# Patient Record
Sex: Male | Born: 1955 | Hispanic: No | Marital: Married | Smoking: Current some day smoker
Health system: Southern US, Community
[De-identification: ages and names within clinical notes are randomized; demographics above are authoritative.]

## PROBLEM LIST (undated history)

## (undated) DIAGNOSIS — F329 Major depressive disorder, single episode, unspecified: Secondary | ICD-10-CM

## (undated) DIAGNOSIS — C61 Malignant neoplasm of prostate: Secondary | ICD-10-CM

## (undated) DIAGNOSIS — G459 Transient cerebral ischemic attack, unspecified: Secondary | ICD-10-CM

## (undated) DIAGNOSIS — M1990 Unspecified osteoarthritis, unspecified site: Secondary | ICD-10-CM

## (undated) DIAGNOSIS — N390 Urinary tract infection, site not specified: Secondary | ICD-10-CM

## (undated) DIAGNOSIS — E785 Hyperlipidemia, unspecified: Secondary | ICD-10-CM

## (undated) DIAGNOSIS — I639 Cerebral infarction, unspecified: Secondary | ICD-10-CM

## (undated) DIAGNOSIS — F32A Depression, unspecified: Secondary | ICD-10-CM

## (undated) DIAGNOSIS — F524 Premature ejaculation: Secondary | ICD-10-CM

## (undated) DIAGNOSIS — N400 Enlarged prostate without lower urinary tract symptoms: Secondary | ICD-10-CM

## (undated) DIAGNOSIS — E291 Testicular hypofunction: Secondary | ICD-10-CM

## (undated) DIAGNOSIS — N529 Male erectile dysfunction, unspecified: Secondary | ICD-10-CM

## (undated) HISTORY — DX: Transient cerebral ischemic attack, unspecified: G45.9

## (undated) HISTORY — DX: Hyperlipidemia, unspecified: E78.5

## (undated) HISTORY — DX: Cerebral infarction, unspecified: I63.9

## (undated) HISTORY — DX: Malignant neoplasm of prostate: C61

## (undated) HISTORY — DX: Premature ejaculation: F52.4

## (undated) HISTORY — DX: Major depressive disorder, single episode, unspecified: F32.9

## (undated) HISTORY — DX: Unspecified osteoarthritis, unspecified site: M19.90

## (undated) HISTORY — DX: Urinary tract infection, site not specified: N39.0

## (undated) HISTORY — DX: Male erectile dysfunction, unspecified: N52.9

## (undated) HISTORY — DX: Depression, unspecified: F32.A

## (undated) HISTORY — DX: Benign prostatic hyperplasia without lower urinary tract symptoms: N40.0

## (undated) HISTORY — PX: TONSILLECTOMY: SUR1361

## (undated) HISTORY — DX: Testicular hypofunction: E29.1

---

## 2006-01-26 ENCOUNTER — Other Ambulatory Visit: Payer: Self-pay

## 2006-01-26 ENCOUNTER — Observation Stay: Payer: Self-pay | Admitting: Internal Medicine

## 2008-05-01 ENCOUNTER — Emergency Department: Payer: Self-pay | Admitting: Emergency Medicine

## 2008-05-01 ENCOUNTER — Other Ambulatory Visit: Payer: Self-pay

## 2008-05-09 ENCOUNTER — Ambulatory Visit: Payer: Self-pay | Admitting: Neurology

## 2014-05-06 ENCOUNTER — Ambulatory Visit
Admission: RE | Admit: 2014-05-06 | Discharge: 2014-05-06 | Disposition: A | Discharge status: Home / Self Care | Payer: BC Managed Care – PPO | Source: Ambulatory Visit | Attending: Pain Medicine | Admitting: Pain Medicine

## 2014-05-06 ENCOUNTER — Other Ambulatory Visit: Payer: Self-pay | Admitting: Pain Medicine

## 2014-05-06 DIAGNOSIS — M25552 Pain in left hip: Secondary | ICD-10-CM

## 2014-05-06 DIAGNOSIS — M25551 Pain in right hip: Secondary | ICD-10-CM

## 2014-05-30 ENCOUNTER — Ambulatory Visit: Payer: BC Managed Care – PPO | Admitting: Neurology

## 2014-06-18 DIAGNOSIS — J449 Chronic obstructive pulmonary disease, unspecified: Secondary | ICD-10-CM | POA: Insufficient documentation

## 2014-07-10 ENCOUNTER — Telehealth: Payer: Self-pay | Admitting: *Deleted

## 2014-07-10 NOTE — Telephone Encounter (Signed)
Patient calling in to r/s his appointment from 05/30/14, patient was scheduled for 07/29/14 at 1:30 with Dr Krista Blue.

## 2014-07-10 NOTE — Telephone Encounter (Signed)
Patient called back and cancelled appointment with Dr Krista Blue on 07/29/14, patient states that he already has 5 doctors and that he can't afford another doctor.

## 2014-07-29 ENCOUNTER — Ambulatory Visit: Payer: Self-pay | Admitting: Neurology

## 2014-10-23 DIAGNOSIS — E7800 Pure hypercholesterolemia, unspecified: Secondary | ICD-10-CM | POA: Insufficient documentation

## 2014-10-23 DIAGNOSIS — Z720 Tobacco use: Secondary | ICD-10-CM | POA: Insufficient documentation

## 2014-10-23 DIAGNOSIS — F17200 Nicotine dependence, unspecified, uncomplicated: Secondary | ICD-10-CM | POA: Insufficient documentation

## 2014-10-23 DIAGNOSIS — F32A Depression, unspecified: Secondary | ICD-10-CM

## 2014-10-23 DIAGNOSIS — F329 Major depressive disorder, single episode, unspecified: Secondary | ICD-10-CM | POA: Insufficient documentation

## 2014-10-23 HISTORY — DX: Depression, unspecified: F32.A

## 2014-11-17 DIAGNOSIS — G8929 Other chronic pain: Secondary | ICD-10-CM | POA: Insufficient documentation

## 2014-11-17 DIAGNOSIS — M549 Dorsalgia, unspecified: Secondary | ICD-10-CM

## 2014-12-16 DIAGNOSIS — R262 Difficulty in walking, not elsewhere classified: Secondary | ICD-10-CM | POA: Insufficient documentation

## 2015-01-03 ENCOUNTER — Ambulatory Visit
Admit: 2015-01-03 | Disposition: A | Discharge status: Home / Self Care | Payer: Self-pay | Attending: Neurology | Admitting: Neurology

## 2015-02-27 ENCOUNTER — Ambulatory Visit: Payer: Self-pay | Admitting: Urology

## 2015-03-05 ENCOUNTER — Telehealth: Payer: Self-pay | Admitting: Urology

## 2015-03-05 NOTE — Telephone Encounter (Signed)
[name] to call, to notify him of his prostate biopsy results. Per Dr. Erlene Quan results are negative except one core which is only atypical we will need to schedule him for a follow up apt for discussion on ED

## 2015-03-09 NOTE — Telephone Encounter (Signed)
Spoke with patient and notified him of the biopsy results and he states that he had been previously scheduled for an apt on Friday with Dr. Erlene Quan for further discussion on ED.

## 2015-03-10 ENCOUNTER — Encounter: Payer: Self-pay | Admitting: Family Medicine

## 2015-03-13 ENCOUNTER — Encounter: Payer: Self-pay | Admitting: Urology

## 2015-03-13 ENCOUNTER — Telehealth: Payer: Self-pay | Admitting: *Deleted

## 2015-03-13 ENCOUNTER — Ambulatory Visit (INDEPENDENT_AMBULATORY_CARE_PROVIDER_SITE_OTHER): Payer: BLUE CROSS/BLUE SHIELD | Admitting: Urology

## 2015-03-13 VITALS — BP 117/80 | HR 109 | Ht 67.0 in | Wt 140.6 lb

## 2015-03-13 DIAGNOSIS — N528 Other male erectile dysfunction: Secondary | ICD-10-CM | POA: Diagnosis not present

## 2015-03-13 DIAGNOSIS — C61 Malignant neoplasm of prostate: Secondary | ICD-10-CM

## 2015-03-13 MED ORDER — ALPROSTADIL (VASODILATOR) 20 MCG IC KIT: 20.0000 ug | PACK | INTRACAVERNOUS | Status: DC | PRN

## 2015-03-13 NOTE — Telephone Encounter (Signed)
Caverject called into pharmacy. Ok per Dr. Erlene Quan. Cw,lpn

## 2015-03-13 NOTE — Patient Instructions (Signed)
Erectile Dysfunction Erectile dysfunction is the inability to get or sustain a good enough erection to have sexual intercourse. Erectile dysfunction may involve:  Inability to get an erection.  Lack of enough hardness to allow penetration.  Loss of the erection before sex is finished.  Premature ejaculation. CAUSES  Certain drugs, such as:  Pain relievers.  Antihistamines.  Antidepressants.  Blood pressure medicines.  Water pills (diuretics).  Ulcer medicines.  Muscle relaxants.  Illegal drugs.  Excessive drinking.  Psychological causes, such as:  Anxiety.  Depression.  Sadness.  Exhaustion.  Performance fear.  Stress.  Physical causes, such as:  Artery problems. This may include diabetes, smoking, liver disease, or atherosclerosis.  High blood pressure.  Hormonal problems, such as low testosterone.  Obesity.  Nerve problems. This may include back or pelvic injuries, diabetes mellitus, multiple sclerosis, or Parkinson disease. SYMPTOMS  Inability to get an erection.  Lack of enough hardness to allow penetration.  Loss of the erection before sex is finished.  Premature ejaculation.  Normal erections at some times, but with frequent unsatisfactory episodes.  Orgasms that are not satisfactory in sensation or frequency.  Low sexual satisfaction in either partner because of erection problems.  A curved penis occurring with erection. The curve may cause pain or may be too curved to allow for intercourse.  Never having nighttime erections. DIAGNOSIS Your caregiver can often diagnose this condition by:  Performing a physical exam to find other diseases or specific problems with the penis.  Asking you detailed questions about the problem.  Performing blood tests to check for diabetes mellitus or to measure hormone levels.  Performing urine tests to find other underlying health conditions.  Performing an ultrasound exam to check for  scarring.  Performing a test to check blood flow to the penis.  Doing a sleep study at home to measure nighttime erections. TREATMENT   You may be prescribed medicines by mouth.  You may be given medicine injections into the penis.  You may be prescribed a vacuum pump with a ring.  Penile implant surgery may be performed. You may receive:  An inflatable implant.  A semirigid implant.  Blood vessel surgery may be performed. HOME CARE INSTRUCTIONS  If you are prescribed oral medicine, you should take the medicine as prescribed. Do not increase the dosage without first discussing it with your physician.  If you are using self-injections, be careful to avoid any veins that are on the surface of the penis. Apply pressure to the injection site for 5 minutes.  If you are using a vacuum pump, make sure you have read the instructions before using it. Discuss any questions with your physician before taking the pump home. SEEK MEDICAL CARE IF:  You experience pain that is not responsive to the pain medicine you have been prescribed.  You experience nausea or vomiting. SEEK IMMEDIATE MEDICAL CARE IF:   When taking oral or injectable medications, you experience an erection that lasts longer than 4 hours. If your physician is unavailable, go to the nearest emergency room for evaluation. An erection that lasts much longer than 4 hours can result in permanent damage to your penis.  You have pain that is severe.  You develop redness, severe pain, or severe swelling of your penis.  You have redness spreading up into your groin or lower abdomen.  You are unable to pass your urine. Document Released: 09/09/2000 Document Revised: 05/15/2013 Document Reviewed: 02/14/2013 [facility] Patient Information 2015 [place], [place]. This information is not  intended to replace advice given to you by your health care provider. Make sure you discuss any questions you have with your health care provider.

## 2015-03-13 NOTE — Telephone Encounter (Signed)
Pt called and states that Dr. Erlene Quan prescribed him an injection today. His insurance will not cover that injection. His insurance will cover Cavergett , Alerostavil,  He prefer the Cavergett injection. Can you send it to his pharmacy. Pharmacy [facility] [place]

## 2015-03-13 NOTE — Progress Notes (Signed)
03/13/2015 1:54 PM   Mark Hodges 1955-11-03 [id number]  Referring provider: No referring provider defined for this encounter.  Chief Complaint  Patient presents with  . Follow-up    biopsy results  . Erectile Dysfunction    pt want to discuss ED injections    HPI:  59 yo M with diagnosed in 01/2014 with T1c Gleason 3+3 prostate cancer in 2/12 cores, 1-3% and single core HGPIN dx 01/2014. PSA at time of dx 5.41. TRUS vol 34 cc. He has been on active surveillance.   Repeat biopsy on 02/20/15 per active surveillance protocol which showed only a small focus of atypical glands, suspicious for carcinoma   TRUS vol 33 cc.     His most recent PSA was 7.0 in May 6th 2016.  Also complains of LUTS, urinary frequency. Not taking any prostate meds. He was previously prescribed Flomax back in 9/ 2015 but never took this medication.  Baseline ED, failed multiple PDE5i, unable to afford injections but very interested in persuing. No sucess with [facility].    PMH: Past Medical History  Diagnosis Date  . Prostate cancer   . BPH (benign prostatic hyperplasia)   . Erectile dysfunction   . Premature ejaculation   . Hypogonadism in male   . Hyperlipidemia   . Lower urinary tract infection   . TIA (transient ischemic attack)     Surgical History: Past Surgical History  Procedure Laterality Date  . Tonsillectomy      Home Medications:    Medication List       This list is accurate as of: 03/13/15 11:59 PM.  Always use your most recent med list.               alprostadil 20 MCG injection  Commonly known as:  EDEX  20 mcg by Intracavitary route as needed for erectile dysfunction. use no more than 3 times per week     ANORO ELLIPTA 62.5-25 MCG/INH Aepb  Generic drug:  Umeclidinium-Vilanterol  Inhale into the lungs.     aspirin 325 MG tablet  Take by mouth.     atorvastatin 10 MG tablet  Commonly known as:  LIPITOR  Take by mouth.     clonazePAM 0.5 MG tablet    Commonly known as:  KLONOPIN  Take by mouth.     gabapentin 300 MG capsule  Commonly known as:  NEURONTIN     ipratropium-albuterol 0.5-2.5 (3) MG/3ML Soln  Commonly known as:  DUONEB  Inhale into the lungs.     nortriptyline 10 MG capsule  Commonly known as:  PAMELOR  Take 3 pills at night for a week then increase to 4 pills at night and continue that dose     nortriptyline 50 MG capsule  Commonly known as:  PAMELOR  Take by mouth.     oxyCODONE-acetaminophen 10-325 MG per tablet  Commonly known as:  PERCOCET  Take by mouth.     PROAIR HFA 108 (90 BASE) MCG/ACT inhaler  Generic drug:  albuterol  Inhale into the lungs.     Venlafaxine HCl 225 MG Tb24        Allergies: No Known Allergies  Family History: Family History  Problem Relation Age of Onset  . Heart disease Father     Social History:  reports that he has been smoking.  His smokeless tobacco use includes Chew. He reports that he does not drink alcohol or use illicit drugs.   Physical Exam: BP 117/80 mmHg  Pulse 109  Ht 5\' 7"  (1.702 m)  Wt 140 lb 9.6 oz (63.776 kg)  BMI 22.02 kg/m2  Constitutional:  Alert and oriented, No acute distress. Ambulating with cane. HEENT: [place] AT, moist mucus membranes.  Trachea midline, no masses. Cardiovascular: No clubbing, cyanosis, or edema. Respiratory: Normal respiratory effort, no increased work of breathing. GI: Abdomen is soft, nontender, nondistended, no abdominal masses GU: No CVA tenderness.  Skin: No rashes, bruises or suspicious lesions. Neurologic: Grossly intact, no focal deficits, moving all 4 extremities. Psychiatric: Normal mood and affect.  Assessment & Plan:   1. Prostate cancer Repeat PNBx reviewed, no evidence of clinically signficant cancer despite rising PSA to 7.0.  Results reviewed with patient today.  Recommend continuation of active surveillance.    2. Other male erectile dysfunction Discussed intercavernosal injections today.  Prescribed  Trimix and will have patient return for teaching to our office in 2-3 weeks.   Return in about 3 weeks (around 04/03/2015) for injection teaching.  Hollice Espy, MD  [facility] [address], [place], [place] [address] [phone]

## 2015-03-15 ENCOUNTER — Encounter: Payer: Self-pay | Admitting: Urology

## 2015-03-16 ENCOUNTER — Other Ambulatory Visit: Payer: Self-pay | Admitting: Urology

## 2015-03-17 ENCOUNTER — Other Ambulatory Visit: Payer: Self-pay

## 2015-03-17 DIAGNOSIS — N529 Male erectile dysfunction, unspecified: Secondary | ICD-10-CM

## 2015-03-17 MED ORDER — ALPROSTADIL (VASODILATOR) 20 MCG IC KIT: 20.0000 ug | PACK | INTRACAVERNOUS | Status: DC | PRN

## 2015-03-19 ENCOUNTER — Telehealth: Payer: Self-pay

## 2015-03-19 NOTE — Telephone Encounter (Signed)
Pt called asking if you would be willing to call in [facility] and then a months supply from here on out to [place]. Please advise.

## 2015-03-22 NOTE — Telephone Encounter (Signed)
OK.  We will hold off on the express scripts prescription until we have done our office teaching.

## 2015-03-23 ENCOUNTER — Other Ambulatory Visit: Payer: Self-pay

## 2015-03-23 DIAGNOSIS — N529 Male erectile dysfunction, unspecified: Secondary | ICD-10-CM

## 2015-03-23 MED ORDER — CAVERJECT IMPULSE 20 MCG IC KIT: 20.0000 ug | PACK | INTRACAVERNOUS | Status: DC | PRN

## 2015-03-23 NOTE — Telephone Encounter (Signed)
Spoke with pt wife, made aware of caverject ordering. Wife voiced understanding. Pt will call back to make f/u appt with Dr. Erlene Quan. Cw,lpn

## 2015-04-10 ENCOUNTER — Ambulatory Visit (INDEPENDENT_AMBULATORY_CARE_PROVIDER_SITE_OTHER): Payer: BLUE CROSS/BLUE SHIELD | Admitting: Urology

## 2015-04-10 VITALS — BP 128/74 | HR 105 | Ht 68.0 in | Wt 136.8 lb

## 2015-04-10 DIAGNOSIS — C61 Malignant neoplasm of prostate: Secondary | ICD-10-CM

## 2015-04-10 DIAGNOSIS — N528 Other male erectile dysfunction: Secondary | ICD-10-CM | POA: Diagnosis not present

## 2015-04-10 NOTE — Progress Notes (Signed)
3:37 PM   DANA DORNER 1956-05-27 [id number]  Referring provider: Tracie Harrier, MD [place], [place] [address]  Chief Complaint  Patient presents with  . Erectile Dysfunction    Injection teaching    HPI:  59 yo M with diagnosed in 01/2014 with T1c Gleason 3+3 prostate cancer in 2/12 cores, 1-3% and single core HGPIN dx 01/2014. PSA at time of dx 5.41. TRUS vol 34 cc. He has been on active surveillance.   Repeat biopsy on 02/20/15 per active surveillance protocol which showed only a small focus of atypical glands, suspicious for carcinoma   TRUS vol 33 cc.   His most recent PSA was 7.0 in May 6th 2016/ rectal exam normal.  Also complains of LUTS, urinary frequency. Not taking any prostate meds. He was previously prescribed Flomax back in 9/ 2015 but never took this medication.  Baseline ED, failed multiple PDE5i, unable to afford injections but very interested in persuing. No sucess with VED.    He presents today for Caverject teaching.      PMH: Past Medical History  Diagnosis Date  . Prostate cancer   . BPH (benign prostatic hyperplasia)   . Erectile dysfunction   . Premature ejaculation   . Hypogonadism in male   . Hyperlipidemia   . Lower urinary tract infection   . TIA (transient ischemic attack)     Surgical History: Past Surgical History  Procedure Laterality Date  . Tonsillectomy      Home Medications:    Medication List       This list is accurate as of: 04/10/15  3:37 PM.  Always use your most recent med list.               alprostadil 20 MCG injection  Commonly known as:  EDEX  20 mcg by Intracavitary route as needed for erectile dysfunction. use no more than 3 times per week     CAVERJECT IMPULSE 20 MCG injection  Generic drug:  alprostadil  20 mcg by Intracavitary route as needed for erectile dysfunction. use no more than 3 times per week     ANORO ELLIPTA 62.5-25 MCG/INH Aepb  Generic drug:   Umeclidinium-Vilanterol  Inhale into the lungs.     aspirin 325 MG tablet  Take by mouth.     atorvastatin 10 MG tablet  Commonly known as:  LIPITOR  Take by mouth.     clonazePAM 0.5 MG tablet  Commonly known as:  KLONOPIN  Take by mouth.     gabapentin 300 MG capsule  Commonly known as:  NEURONTIN     ipratropium-albuterol 0.5-2.5 (3) MG/3ML Soln  Commonly known as:  DUONEB  Inhale into the lungs.     nortriptyline 10 MG capsule  Commonly known as:  PAMELOR  Take 3 pills at night for a week then increase to 4 pills at night and continue that dose     nortriptyline 50 MG capsule  Commonly known as:  PAMELOR  Take by mouth.     oxyCODONE-acetaminophen 10-325 MG per tablet  Commonly known as:  PERCOCET  Take by mouth.     PROAIR HFA 108 (90 BASE) MCG/ACT inhaler  Generic drug:  albuterol  Inhale into the lungs.     Venlafaxine HCl 225 MG Tb24        Allergies: No Known Allergies  Family History: Family History  Problem Relation Age of Onset  . Heart disease Father  Social History:  reports that he has been smoking.  His smokeless tobacco use includes Chew. He reports that he does not drink alcohol or use illicit drugs.   Physical Exam: BP 128/74 mmHg  Pulse 105  Ht 5\' 8"  (1.727 m)  Wt 136 lb 12.8 oz (62.052 kg)  BMI 20.81 kg/m2  Constitutional:  Alert and oriented, No acute distress. Ambulating with cane. HEENT: [place] AT, moist mucus membranes.  Trachea midline, no masses. Cardiovascular: No clubbing, cyanosis, or edema. Respiratory: Normal respiratory effort, no increased work of breathing. GI: Abdomen is soft, nontender, nondistended, no abdominal masses GU: No CVA tenderness.   Normal circumcised phallus. Skin: No rashes, bruises or suspicious lesions. Neurologic: Grossly intact, no focal deficits, moving all 4 extremities. Psychiatric: Normal mood and affect.  Procedure:  Lateral aspect of right corpora and midshaft cleaned with alcohol pad.  Penis placed on stretch. 5 g injection given without complication. Pressure held at injection sites for a few minutes. Performing injection, patient was taught how to prep and administered injection.  With 5 minutes, the patient achieved an approximately 80% Foley tumesced erection lasting approximately 15 minutes with spontaneous detumescence.  Assessment & Plan:   1. Prostate cancer  Diagnosed in 01/2014 with T1c Gleason 3+3 prostate cancer in 2/12 cores, 1-3% and single core HGPIN dx 01/2014. PSA at time of dx 5.41. Repeat bx 01/2015,  Since ocus of atypical cells but no evidence of clinically signficant cancer despite rising PSA to 7.0.   Recommend continuation of active surveillance.    2. Other male erectile dysfunction Status post successful non-painful erection with 5 g of Caverject in the shortly sustained erection. Recommend home dose of 10 g. Teaching sufficient today.  Patient advised to call if dose needs to be titrated due to poor quality or duration..  Return in about 4 months (around 08/11/2015) for PSA/ DRE.  Hollice Espy, MD  [facility] [address] [place],  [address] [phone]   I spent 20 min with this patient of which greater than 50% was spent in counseling and coordination of care with the patient.

## 2015-04-12 ENCOUNTER — Encounter: Payer: Self-pay | Admitting: Urology

## 2015-04-23 DIAGNOSIS — E785 Hyperlipidemia, unspecified: Secondary | ICD-10-CM | POA: Insufficient documentation

## 2015-05-29 DIAGNOSIS — R200 Anesthesia of skin: Secondary | ICD-10-CM | POA: Insufficient documentation

## 2015-06-03 ENCOUNTER — Encounter: Payer: Self-pay | Admitting: Emergency Medicine

## 2015-06-03 ENCOUNTER — Emergency Department
Admission: EM | Admit: 2015-06-03 | Discharge: 2015-06-03 | Disposition: A | Discharge status: Home / Self Care | Payer: BLUE CROSS/BLUE SHIELD | Attending: Emergency Medicine | Admitting: Emergency Medicine

## 2015-06-03 ENCOUNTER — Emergency Department: Payer: BLUE CROSS/BLUE SHIELD

## 2015-06-03 DIAGNOSIS — Y92007 Garden or yard of unspecified non-institutional (private) residence as the place of occurrence of the external cause: Secondary | ICD-10-CM | POA: Insufficient documentation

## 2015-06-03 DIAGNOSIS — M25552 Pain in left hip: Secondary | ICD-10-CM

## 2015-06-03 DIAGNOSIS — Z79899 Other long term (current) drug therapy: Secondary | ICD-10-CM | POA: Diagnosis not present

## 2015-06-03 DIAGNOSIS — Z7982 Long term (current) use of aspirin: Secondary | ICD-10-CM | POA: Diagnosis not present

## 2015-06-03 DIAGNOSIS — Z79891 Long term (current) use of opiate analgesic: Secondary | ICD-10-CM | POA: Insufficient documentation

## 2015-06-03 DIAGNOSIS — Y990 Civilian activity done for income or pay: Secondary | ICD-10-CM | POA: Diagnosis not present

## 2015-06-03 DIAGNOSIS — Z79811 Long term (current) use of aromatase inhibitors: Secondary | ICD-10-CM | POA: Insufficient documentation

## 2015-06-03 DIAGNOSIS — Z7951 Long term (current) use of inhaled steroids: Secondary | ICD-10-CM | POA: Insufficient documentation

## 2015-06-03 DIAGNOSIS — S79912A Unspecified injury of left hip, initial encounter: Secondary | ICD-10-CM | POA: Insufficient documentation

## 2015-06-03 DIAGNOSIS — M545 Low back pain, unspecified: Secondary | ICD-10-CM

## 2015-06-03 DIAGNOSIS — Z720 Tobacco use: Secondary | ICD-10-CM | POA: Diagnosis not present

## 2015-06-03 DIAGNOSIS — S3992XA Unspecified injury of lower back, initial encounter: Secondary | ICD-10-CM | POA: Insufficient documentation

## 2015-06-03 DIAGNOSIS — X58XXXA Exposure to other specified factors, initial encounter: Secondary | ICD-10-CM | POA: Diagnosis not present

## 2015-06-03 DIAGNOSIS — Y9389 Activity, other specified: Secondary | ICD-10-CM | POA: Insufficient documentation

## 2015-06-03 MED ORDER — DIAZEPAM 5 MG PO TABS
5.0000 mg | ORAL_TABLET | Freq: Once | ORAL | Status: AC
  Administered 2015-06-03: 5 mg via ORAL
  Filled 2015-06-03: qty 1

## 2015-06-03 MED ORDER — OXYCODONE HCL 5 MG PO TABS
5.0000 mg | ORAL_TABLET | Freq: Once | ORAL | Status: AC
  Administered 2015-06-03: 5 mg via ORAL
  Filled 2015-06-03: qty 1

## 2015-06-03 MED ORDER — DIAZEPAM 5 MG PO TABS: 5.0000 mg | ORAL_TABLET | Freq: Two times a day (BID) | ORAL | Status: DC | PRN

## 2015-06-03 NOTE — ED Notes (Signed)
Feels a little better. But wishes to speak with Dr.Kaminski before discharge

## 2015-06-03 NOTE — ED Provider Notes (Signed)
[facility] Emergency Department Provider Note  ____________________________________________  Time seen: 1246   I have reviewed the triage vital signs and the nursing notes.   HISTORY  Chief Complaint Back Pain     HPI Mark Hodges is a 59 y.o. male who presents to the emergency department by EMS due to severe back pain.  He reports this past weekend he was working in the yard. This included using a chainsaw to cut down some bushes and to hold debris away. At one point, he attempted to drag a cedar over towards his riding mower so he'll be able to drag it away. When he did this he felt a pull (not a pop as noted in the triage note). He had mild soreness after this event on Saturday and Sunday which continued through the early part of this week, but the discomfort was not severe. Today, while sitting on the side of his bed to put his shoes on, he felt a sudden spasm occur with pain radiating into the left hip. He reports the pain was 20/10 and brought him to tears. He did take an oxycodone, 10 mg, which she said provided no relief, however he is able to calmly speak with [place] here.  He denies any weakness in the left leg or loss of sensation.    Past Medical History  Diagnosis Date  . Prostate cancer   . BPH (benign prostatic hyperplasia)   . Erectile dysfunction   . Premature ejaculation   . Hypogonadism in male   . Hyperlipidemia   . Lower urinary tract infection   . TIA (transient ischemic attack)     Patient Active Problem List   Diagnosis Date Noted  . Has a tremor 12/24/2014  . Difficulty in walking 12/16/2014  . Back pain, chronic 11/17/2014  . Cephalalgia 11/17/2014  . Tingling 11/17/2014  . Clinical depression 10/23/2014  . Compulsive tobacco user syndrome 10/23/2014  . Hypercholesterolemia without hypertriglyceridemia 10/23/2014  . 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 Tonsillectomy       Current Outpatient Rx  Name  Route  Sig  Dispense  Refill  . albuterol (PROAIR HFA) 108 (90 BASE) MCG/ACT inhaler   Inhalation   Inhale into the lungs.         Marland Kitchen alprostadil (EDEX) 20 MCG injection   Intracavitary   20 mcg by Intracavitary route as needed for erectile dysfunction. use no more than 3 times per week   1 each   12   . aspirin 325 MG tablet   Oral   Take by mouth.         Marland Kitchen atorvastatin (LIPITOR) 10 MG tablet   Oral   Take by mouth.         Marland Kitchen CAVERJECT IMPULSE 20 MCG injection   Intracavitary   20 mcg by Intracavitary route as needed for erectile dysfunction. use no more than 3 times per week   6 each   0     Dispense as written.   . clonazePAM (KLONOPIN) 0.5 MG tablet   Oral   Take by mouth.         . diazepam (VALIUM) 5 MG tablet   Oral   Take 1 tablet (5 mg total) by mouth every 12 (twelve) hours as needed for muscle spasms.   16 tablet   0   . gabapentin (NEURONTIN) 300 MG capsule               .  ipratropium-albuterol (DUONEB) 0.5-2.5 (3) MG/3ML SOLN   Inhalation   Inhale into the lungs.         . nortriptyline (PAMELOR) 10 MG capsule      Take 3 pills at night for a week then increase to 4 pills at night and continue that dose         . nortriptyline (PAMELOR) 50 MG capsule   Oral   Take by mouth.         . oxyCODONE-acetaminophen (PERCOCET) 10-325 MG per tablet   Oral   Take by mouth.         . Umeclidinium-Vilanterol (ANORO ELLIPTA) 62.5-25 MCG/INH AEPB   Inhalation   Inhale into the lungs.         . Venlafaxine HCl 225 MG TB24                 Allergies Review of patient's allergies indicates no known allergies.  Family History  Problem Relation Age of Onset  . Heart disease Father     Social History Social History  Substance Use Topics  . Smoking status: Current Some Day Smoker  . Smokeless tobacco: Current User    Types: Chew  . Alcohol Use: No    Review of Systems  Constitutional:  Negative for fever. ENT: Negative for sore throat. Cardiovascular: Negative for chest pain. Respiratory: Negative for shortness of breath. Gastrointestinal: Negative for abdominal pain, vomiting and diarrhea. Genitourinary: Negative for dysuria. Musculoskeletal: Pain in left gluteal and left hip and back. See history of present illness Skin: Negative for rash. Neurological: Negative for headaches   10-point ROS otherwise negative.  ____________________________________________   PHYSICAL EXAM:  VITAL SIGNS: ED Triage Vitals  Enc Vitals Group     BP 06/03/15 1228 144/85 mmHg     Pulse Rate 06/03/15 1228 90     Resp 06/03/15 1228 20     Temp 06/03/15 1228 99 F (37.2 C)     Temp Source 06/03/15 1228 Oral     SpO2 06/03/15 1228 99 %     Weight 06/03/15 1228 148 lb (67.132 kg)     Height 06/03/15 1228 5\' 8"  (1.727 m)     Head Cir --      Peak Flow --      Pain Score 06/03/15 1229 10     Pain Loc --      Pain Edu? --      Excl. in [place]? --     Constitutional:  Alert and oriented. Well appearing and in no distress, but uncomfortable at times with movement and draining parts of the physical exam. ENT   Head: Normocephalic and atraumatic.   Nose: No congestion/rhinnorhea. Cardiovascular: Normal rate, regular rhythm, no murmur noted Respiratory:  Normal respiratory effort, no tachypnea.    Breath sounds are clear and equal bilaterally.  Gastrointestinal: Soft and nontender. No distention.  Back: Pain with some movement, but no focal pain on palpation. He has no midline pain in his lumbar section and no significant pain on palpation of the paraspinals.  The patient does have notable pain with both passive and active elevation of the left leg. This pain is primarily in the left gluteus. Musculoskeletal: No deformity noted. As noted above, the patient has discomfort with movement of the left leg. The pelvis is stable and there is no pain on palpation of the hip or iliac  crests. Neurologic:  Normal speech and language. No gross focal neurologic deficits are appreciated. Patient is equal  and normal sensation in both distal legs. He has good plantar and dorsal flexion. Skin:  Skin is warm, dry. No rash noted. Psychiatric: Mood and affect are normal. Speech and behavior are normal.  ____________________________________________   RADIOLOGY  Lumbar spine   IMPRESSION: 1. No evidence of acute osseous abnormality. 2. Mild lumbar spondylosis.  Left hip  IMPRESSION: No fracture or dislocation. Joint spaces appear unremarkable.  ____________________________________________  INITIAL IMPRESSION / ASSESSMENT AND PLAN / ED COURSE  Pertinent labs & imaging results that were available during my care of the patient were reviewed by me and considered in my medical decision making (see chart for details).  Patient with acute onset of pain in his left hip and lower back. We will obtain an x-ray of his lumbar spine due to the small possibility of a compression fracture from his activity this past Saturday. We will also obtain an x-ray of the left hip, given his history of arthritis and the degree of pain that he is having in this area.  ----------------------------------------- 2:11 PM on 06/03/2015 -----------------------------------------  X-rays of this patient's back and left hip are normal. We will discharge him with a prescription for Valium for muscle relaxation. He has oxycodone at home which she can continue to use. I've counseled him to take ibuprofen as well as.  At this time, the patient appears comfortable. We have outlined the differential diagnosis including the possibility of a herniated disc. The patient understands that it will take time for this condition to improve and he will follow-up with his regular doctor.  ____________________________________________   FINAL CLINICAL IMPRESSION(S) / ED DIAGNOSES  Final diagnoses:  Left-sided low back  pain without sciatica  Left hip pain      Ahmed Prima, MD 06/03/15 1435

## 2015-06-03 NOTE — ED Notes (Signed)
Brought in via ems from home . States he was pulling weeds this weekend  Bent down to put on shoe and felt a pop pain is from let hip and lower back . Took oxycodone about 10 am w/o relief. Unable to stand or sit up

## 2015-06-03 NOTE — ED Notes (Signed)
Having severe pain to lower back and left hip area  . States he felt a pop this am. And has not been able to sit up or stand.

## 2015-06-03 NOTE — Discharge Instructions (Signed)
Take ibuprofen, 6 mg, 3 times a day, for 3 days, then as needed. Continue to use your regular pain medicine as needed. You may take Valium for additional muscle relaxation. Follow-up with your regular doctor. Return to the emergency department if you have worsening symptoms, radiation of discomfort down the left leg, difficulties urinating or defecating, or if you have other urgent concerns.  Back Pain, Adult Back pain is very common. The pain often gets better over time. The cause of back pain is usually not dangerous. Most people can learn to manage their back pain on their own.  HOME CARE   Stay active. Start with short walks on flat ground if you can. Try to walk farther each day.  Do not sit, drive, or stand in one place for more than 30 minutes. Do not stay in bed.  Do not avoid exercise or work. Activity can help your back heal faster.  Be careful when you bend or lift an object. Bend at your knees, keep the object close to you, and do not twist.  Sleep on a firm mattress. Lie on your side, and bend your knees. If you lie on your back, put a pillow under your knees.  Only take medicines as told by your doctor.  Put ice on the injured area.  Put ice in a plastic bag.  Place a towel between your skin and the bag.  Leave the ice on for 15-20 minutes, 03-04 times a day for the first 2 to 3 days. After that, you can switch between ice and heat packs.  Ask your doctor about back exercises or massage.  Avoid feeling anxious or stressed. Find good ways to deal with stress, such as exercise. GET HELP RIGHT AWAY IF:   Your pain does not go away with rest or medicine.  Your pain does not go away in 1 week.  You have new problems.  You do not feel well.  The pain spreads into your legs.  You cannot control when you poop (bowel movement) or pee (urinate).  Your arms or legs feel weak or lose feeling (numbness).  You feel sick to your stomach (nauseous) or throw up  (vomit).  You have belly (abdominal) pain.  You feel like you may pass out (faint). MAKE SURE YOU:   Understand these instructions.  Will watch your condition.  Will get help right away if you are not doing well or get worse. Document Released: 02/29/2008 Document Revised: 12/05/2011 Document Reviewed: 01/14/2014 [facility] Patient Information 2015 [place], [place]. This information is not intended to replace advice given to you by your health care provider. Make sure you discuss any questions you have with your health care provider.

## 2015-06-17 ENCOUNTER — Other Ambulatory Visit: Payer: Self-pay | Admitting: Internal Medicine

## 2015-06-17 DIAGNOSIS — S3992XA Unspecified injury of lower back, initial encounter: Secondary | ICD-10-CM

## 2015-06-17 DIAGNOSIS — C61 Malignant neoplasm of prostate: Secondary | ICD-10-CM

## 2015-06-17 DIAGNOSIS — M5442 Lumbago with sciatica, left side: Secondary | ICD-10-CM

## 2015-06-26 ENCOUNTER — Ambulatory Visit
Admission: RE | Admit: 2015-06-26 | Discharge: 2015-06-26 | Disposition: A | Discharge status: Home / Self Care | Payer: BLUE CROSS/BLUE SHIELD | Source: Ambulatory Visit | Attending: Internal Medicine | Admitting: Internal Medicine

## 2015-06-26 DIAGNOSIS — M5126 Other intervertebral disc displacement, lumbar region: Secondary | ICD-10-CM | POA: Insufficient documentation

## 2015-06-26 DIAGNOSIS — M5442 Lumbago with sciatica, left side: Secondary | ICD-10-CM

## 2015-06-26 DIAGNOSIS — S3992XA Unspecified injury of lower back, initial encounter: Secondary | ICD-10-CM

## 2015-06-26 DIAGNOSIS — C61 Malignant neoplasm of prostate: Secondary | ICD-10-CM

## 2015-06-26 DIAGNOSIS — M542 Cervicalgia: Secondary | ICD-10-CM | POA: Diagnosis present

## 2015-06-26 MED ORDER — GADOBENATE DIMEGLUMINE 529 MG/ML IV SOLN
15.0000 mL | Freq: Once | INTRAVENOUS | Status: AC | PRN
  Administered 2015-06-26: 13 mL via INTRAVENOUS

## 2015-07-14 ENCOUNTER — Other Ambulatory Visit: Payer: Self-pay | Admitting: Neurosurgery

## 2015-07-15 ENCOUNTER — Other Ambulatory Visit: Payer: Self-pay | Admitting: Neurosurgery

## 2015-07-15 DIAGNOSIS — M5410 Radiculopathy, site unspecified: Secondary | ICD-10-CM

## 2015-07-22 ENCOUNTER — Ambulatory Visit
Admission: RE | Admit: 2015-07-22 | Discharge: 2015-07-22 | Disposition: A | Discharge status: Home / Self Care | Payer: BLUE CROSS/BLUE SHIELD | Source: Ambulatory Visit | Attending: Neurosurgery | Admitting: Neurosurgery

## 2015-07-22 DIAGNOSIS — M4804 Spinal stenosis, thoracic region: Secondary | ICD-10-CM | POA: Insufficient documentation

## 2015-07-22 DIAGNOSIS — G950 Syringomyelia and syringobulbia: Secondary | ICD-10-CM | POA: Diagnosis not present

## 2015-07-22 DIAGNOSIS — M47894 Other spondylosis, thoracic region: Secondary | ICD-10-CM | POA: Diagnosis not present

## 2015-07-22 DIAGNOSIS — M5410 Radiculopathy, site unspecified: Secondary | ICD-10-CM

## 2015-07-22 DIAGNOSIS — M5414 Radiculopathy, thoracic region: Secondary | ICD-10-CM | POA: Diagnosis present

## 2015-07-22 DIAGNOSIS — M5416 Radiculopathy, lumbar region: Secondary | ICD-10-CM | POA: Diagnosis present

## 2015-07-22 MED ORDER — GADOBENATE DIMEGLUMINE 529 MG/ML IV SOLN
15.0000 mL | Freq: Once | INTRAVENOUS | Status: AC | PRN
  Administered 2015-07-22: 12 mL via INTRAVENOUS

## 2015-07-28 ENCOUNTER — Encounter: Payer: Self-pay | Admitting: Pain Medicine

## 2015-07-28 ENCOUNTER — Ambulatory Visit: Payer: BLUE CROSS/BLUE SHIELD | Attending: Pain Medicine | Admitting: Pain Medicine

## 2015-07-28 VITALS — BP 116/82 | HR 92 | Temp 97.9°F | Resp 20 | Ht 68.0 in | Wt 145.0 lb

## 2015-07-28 DIAGNOSIS — G8929 Other chronic pain: Secondary | ICD-10-CM | POA: Diagnosis not present

## 2015-07-28 DIAGNOSIS — G4486 Cervicogenic headache: Secondary | ICD-10-CM

## 2015-07-28 DIAGNOSIS — Z9181 History of falling: Secondary | ICD-10-CM

## 2015-07-28 DIAGNOSIS — M25551 Pain in right hip: Secondary | ICD-10-CM | POA: Insufficient documentation

## 2015-07-28 DIAGNOSIS — Z79891 Long term (current) use of opiate analgesic: Secondary | ICD-10-CM | POA: Diagnosis not present

## 2015-07-28 DIAGNOSIS — F1190 Opioid use, unspecified, uncomplicated: Secondary | ICD-10-CM | POA: Diagnosis not present

## 2015-07-28 DIAGNOSIS — Z87891 Personal history of nicotine dependence: Secondary | ICD-10-CM | POA: Insufficient documentation

## 2015-07-28 DIAGNOSIS — M79604 Pain in right leg: Secondary | ICD-10-CM | POA: Diagnosis present

## 2015-07-28 DIAGNOSIS — M47816 Spondylosis without myelopathy or radiculopathy, lumbar region: Secondary | ICD-10-CM | POA: Insufficient documentation

## 2015-07-28 DIAGNOSIS — F1120 Opioid dependence, uncomplicated: Secondary | ICD-10-CM | POA: Insufficient documentation

## 2015-07-28 DIAGNOSIS — E785 Hyperlipidemia, unspecified: Secondary | ICD-10-CM | POA: Diagnosis not present

## 2015-07-28 DIAGNOSIS — F329 Major depressive disorder, single episode, unspecified: Secondary | ICD-10-CM | POA: Diagnosis not present

## 2015-07-28 DIAGNOSIS — M5416 Radiculopathy, lumbar region: Secondary | ICD-10-CM

## 2015-07-28 DIAGNOSIS — M545 Low back pain, unspecified: Secondary | ICD-10-CM | POA: Insufficient documentation

## 2015-07-28 DIAGNOSIS — F32A Depression, unspecified: Secondary | ICD-10-CM

## 2015-07-28 DIAGNOSIS — R51 Headache: Secondary | ICD-10-CM

## 2015-07-28 DIAGNOSIS — F411 Generalized anxiety disorder: Secondary | ICD-10-CM | POA: Insufficient documentation

## 2015-07-28 DIAGNOSIS — M47896 Other spondylosis, lumbar region: Secondary | ICD-10-CM

## 2015-07-28 DIAGNOSIS — M549 Dorsalgia, unspecified: Secondary | ICD-10-CM | POA: Diagnosis present

## 2015-07-28 DIAGNOSIS — M47812 Spondylosis without myelopathy or radiculopathy, cervical region: Secondary | ICD-10-CM | POA: Insufficient documentation

## 2015-07-28 DIAGNOSIS — M546 Pain in thoracic spine: Secondary | ICD-10-CM | POA: Diagnosis not present

## 2015-07-28 DIAGNOSIS — Z79899 Other long term (current) drug therapy: Secondary | ICD-10-CM

## 2015-07-28 DIAGNOSIS — Z8673 Personal history of transient ischemic attack (TIA), and cerebral infarction without residual deficits: Secondary | ICD-10-CM | POA: Diagnosis not present

## 2015-07-28 DIAGNOSIS — R270 Ataxia, unspecified: Secondary | ICD-10-CM | POA: Insufficient documentation

## 2015-07-28 DIAGNOSIS — G950 Syringomyelia and syringobulbia: Secondary | ICD-10-CM

## 2015-07-28 DIAGNOSIS — M79605 Pain in left leg: Secondary | ICD-10-CM | POA: Diagnosis present

## 2015-07-28 DIAGNOSIS — Z5181 Encounter for therapeutic drug level monitoring: Secondary | ICD-10-CM | POA: Insufficient documentation

## 2015-07-28 DIAGNOSIS — F1990 Other psychoactive substance use, unspecified, uncomplicated: Secondary | ICD-10-CM

## 2015-07-28 DIAGNOSIS — M542 Cervicalgia: Secondary | ICD-10-CM

## 2015-07-28 DIAGNOSIS — R296 Repeated falls: Secondary | ICD-10-CM

## 2015-07-28 DIAGNOSIS — R937 Abnormal findings on diagnostic imaging of other parts of musculoskeletal system: Secondary | ICD-10-CM

## 2015-07-28 DIAGNOSIS — M5412 Radiculopathy, cervical region: Secondary | ICD-10-CM

## 2015-07-28 MED ORDER — OXYCODONE-ACETAMINOPHEN 10-325 MG PO TABS: 1.0000 | ORAL_TABLET | Freq: Four times a day (QID) | ORAL | Status: DC | PRN

## 2015-07-28 MED ORDER — GABAPENTIN 300 MG PO CAPS: 300.0000 mg | ORAL_CAPSULE | Freq: Three times a day (TID) | ORAL | Status: DC

## 2015-07-28 NOTE — Progress Notes (Signed)
Safety precautions to be maintained throughout the outpatient stay will include: orient to surroundings, keep bed in low position, maintain call bell within reach at all times, provide assistance with transfer out of bed and ambulation.  

## 2015-07-28 NOTE — Patient Instructions (Signed)
Oxycodone extended-release capsules What is this medicine? OXYCODONE (ox i KOE done) is a pain reliever. It is used to treat constant pain that lasts for more than a few days. This medicine may be used for other purposes; ask your health care provider or pharmacist if you have questions. What should I tell my health care provider before I take this medicine? They need to know if you have any of these conditions: -Addison's disease -brain tumor -gallbladder disease -head injury -heart disease -history of a drug or alcohol abuse problem -if you often drink alcohol -kidney disease -liver disease -lung or breathing disease, like asthma -mental illness -pancreatic disease -seizures -stomach or intestine problems -thyroid disease -an unusual or allergic reaction to oxycodone, codeine, hydrocodone, morphine, other medicines, foods, dyes, or preservatives -pregnant or trying to get pregnant -breast-feeding How should I use this medicine? Take this medicine by mouth with a full glass of water. Follow the directions on the prescription label. Take this medicine with food. You should always take it with the same amount of food each time. Take your medicine at regular intervals. Do not take it more often than directed. Do not stop taking except on your doctor's advice. A special MedGuide will be given to you by the pharmacist with each prescription and refill. Be sure to read this information carefully each time. Talk to your pediatrician regarding the use of this medicine in children. Special care may be needed. Overdosage: If you think you have taken too much of this medicine contact a poison control center or emergency room at once. NOTE: This medicine is only for you. Do not share this medicine with others. What if I miss a dose? If you miss a dose, take it as soon as you can. If it is almost time for your next dose, take only that dose. Do not take double or extra doses. What may interact  with this medicine? This medicine may interact with the following medications: -antihistamines for allergy, cough and cold -antiviral medicines for HIV or AIDS -atropine -certain antibiotics like clarithromycin, erythromycin, linezolid -certain medicines for anxiety or sleep -certain medicines for bladder problems like oxybutynin, tolterodine -certain medicines for depression, like amitriptyline, fluoxetine, sertraline -certain medicines for fungal infections like ketoconazole and itraconazole -certain medicines for migraine headache like almotriptan, eletriptan, frovatriptan, naratriptan, rizatriptan, sumatriptan, zolmitriptan -certain medicines for Parkinson's disease like benztropine, trihexyphenidyl -certain medicines for seizures like carbamazepine, phenobarbital, phenytoin, primidone -certain medicines for stomach problems like dicyclomine, hyoscyamine -certain medicines for travel sickness like scopolamine -diuretics -general anesthetics like halothane, isoflurane, methoxyflurane, propofol -ipratropium -local anesthetics like lidocaine, pramoxine, tetracaine -MAOIs like Carbex, Eldepryl, Marplan, Nardil, and Parnate -medicines that relax muscles for surgery -narcotic medicines for pain -phenothiazines like chlorpromazine, mesoridazine, prochlorperazine, thioridazine -rifampin This list may not describe all possible interactions. Give your health care provider a list of all the medicines, herbs, non-prescription drugs, or dietary supplements you use. Also tell them if you smoke, drink alcohol, or use illegal drugs. Some items may interact with your medicine. What should I watch for while using this medicine? Tell your doctor or health care professional if your pain does not go away, if it gets worse, or if you have new or a different type of pain. You may develop tolerance to the medicine. Tolerance means that you will need a higher dose of the medication for pain relief. Tolerance is  normal and is expected if you take this medicine for a long time. Do not suddenly stop taking your  medicine because you may develop a severe reaction. Your body becomes used to the medicine. This does NOT mean you are addicted. Addiction is a behavior related to getting and using a drug for a non-medical reason. If you have pain, you have a medical reason to take pain medicine. Your doctor will tell you how much medicine to take. If your doctor wants you to stop the medicine, the dose will be slowly lowered over time to avoid any side effects. You may get drowsy or dizzy. Do not drive, use machinery, or do anything that needs mental alertness until you know how the medicine affects you. Do not stand or sit up quickly, especially if you are an older patient. This reduces the risk of dizzy or fainting spells. Alcohol may interfere with the effect of this medicine. Avoid alcoholic drinks. There are different types of narcotic medicines (opiates) for pain. If you take more than one type at the same time, you may have more side effects. Give your health care provider a list of all medicines you use. Your doctor will tell you how much medicine to take. Do not take more medicine than directed. Call emergency for help if you have problems breathing. This medicine will cause constipation. Try to have a bowel movement at least every 2 to 3 days. If you do not have a bowel movement for 3 days, call your doctor or health care professional. Your mouth may get dry. Chewing sugarless gum or sucking on hard candy, and drinking plenty of water may help. Contact your doctor if the problem does not go away or is severe. What side effects may I notice from receiving this medicine? Side effects that you should report to your doctor or health care professional as soon as possible: -allergic reactions like skin rash, itching or hives, swelling of the face, lips, or tongue -breathing problems -confusion -feeling faint or  lightheaded, falls -stomach pain -trouble passing urine or change in the amount of urine -trouble swallowing -unusually weak or tired Side effects that usually do not require medical attention (report these to your doctor or health care professional if they continue or are bothersome): -constipation -dizziness -dry mouth -itching -nausea, vomiting -tiredness -upset stomach This list may not describe all possible side effects. Call your doctor for medical advice about side effects. You may report side effects to FDA at 1-800-FDA-1088. Where should I keep my medicine? Keep out of the reach of children. This medicine can be abused. Keep your medicine in a safe place to protect it from theft. Do not share this medicine with anyone. Selling or giving away this medicine is dangerous and against the law. Follow the directions in the [place]. Store at room temperature between 15 and 30 degrees C (59 and 86 degrees F). Protect from light. Keep container tightly closed. This medicine may cause accidental overdose and death if it is taken by other adults, children, or pets. Flush any unused medicine down the toilet to reduce the chance of harm. Do not use the medicine after the expiration date. NOTE: This sheet is a summary. It may not cover all possible information. If you have questions about this medicine, talk to your doctor, pharmacist, or health care provider.    2016, [employer]. (2015-01-23 16:05:07)

## 2015-07-29 DIAGNOSIS — R51 Headache: Secondary | ICD-10-CM

## 2015-07-29 DIAGNOSIS — M5412 Radiculopathy, cervical region: Secondary | ICD-10-CM

## 2015-07-29 DIAGNOSIS — M542 Cervicalgia: Secondary | ICD-10-CM

## 2015-07-29 DIAGNOSIS — Z8673 Personal history of transient ischemic attack (TIA), and cerebral infarction without residual deficits: Secondary | ICD-10-CM | POA: Insufficient documentation

## 2015-07-29 DIAGNOSIS — G4486 Cervicogenic headache: Secondary | ICD-10-CM | POA: Insufficient documentation

## 2015-07-29 DIAGNOSIS — G8929 Other chronic pain: Secondary | ICD-10-CM | POA: Insufficient documentation

## 2015-07-29 DIAGNOSIS — N529 Male erectile dysfunction, unspecified: Secondary | ICD-10-CM | POA: Insufficient documentation

## 2015-07-29 DIAGNOSIS — R937 Abnormal findings on diagnostic imaging of other parts of musculoskeletal system: Secondary | ICD-10-CM | POA: Insufficient documentation

## 2015-07-29 DIAGNOSIS — Z9181 History of falling: Secondary | ICD-10-CM | POA: Insufficient documentation

## 2015-07-29 DIAGNOSIS — F1990 Other psychoactive substance use, unspecified, uncomplicated: Secondary | ICD-10-CM | POA: Insufficient documentation

## 2015-07-29 DIAGNOSIS — F411 Generalized anxiety disorder: Secondary | ICD-10-CM | POA: Insufficient documentation

## 2015-07-29 DIAGNOSIS — G950 Syringomyelia and syringobulbia: Secondary | ICD-10-CM | POA: Insufficient documentation

## 2015-07-29 DIAGNOSIS — M25551 Pain in right hip: Secondary | ICD-10-CM

## 2015-07-29 DIAGNOSIS — M47816 Spondylosis without myelopathy or radiculopathy, lumbar region: Secondary | ICD-10-CM | POA: Insufficient documentation

## 2015-07-29 NOTE — Progress Notes (Signed)
Patient's Name: Mark Hodges MRN: [id number] DOB: 27-Mar-1956 DOS: 07/28/2015  Primary Reason(s) for Visit: Encounter for evaluation before starting a Medication. CC: Hip Pain; Back Pain; and Leg Pain   HPI:   Mr. [name] is a 59 y.o. year old, male patient, who returns today as an established patient. He has COPD, mild ([place]); Clinical depression; Difficulty in walking; Compulsive tobacco user syndrome; Hypercholesterolemia without hypertriglyceridemia; Tingling; Has a tremor; Long term current use of opiate analgesic; Long term prescription opiate use; Opiate use; Opiate dependence ([place]); Encounter for therapeutic drug level monitoring; Chronic pain; Chronic low back pain; Chronic radicular lumbar pain; Ataxia; HLD (hyperlipidemia); Current tobacco use; Pure hypercholesterolemia; Arm numbness; Substance use disorder Risk: LOW; Chronic upper back pain; Cervical spondylosis; Chronic neck pain; Cervicogenic headache; Chronic radicular cervical pain; Chronic right hip pain; Generalized anxiety disorder; Depression; At high risk for falls; Erectile dysfunction; History of TIA (transient ischemic attack); 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 His primarily concern today is the Hip Pain; Back Pain; and Leg Pain    The patient comes into the clinic today for his second visit. Today we will be taking over his medication regimen. Once I have had a chance to fully review his medical records and studies I may have some additional questions and depending on the patient's answers, we'll provide him with some alternatives in terms of his pharmacotherapy and/or interventional alternatives. Today's Pain Score: 8  Pain Type: Chronic pain Pain Location: Hip (back, leg) Pain Orientation:  (low back, right hip and left leg) Pain Descriptors / Indicators:  (all of the above) Pain  Frequency: Constant  Date of Last Visit: Date of Last Visit: 04/22/15 Service Provided on Last Visit: Service Provided on Last Visit: Evaluation (new patient)  Pharmacotherapy Review:   Side-effects or Adverse reactions: None reported. Effectiveness: Described as relatively effective, allowing for increase in activities of daily living (ADL). Onset of action: Within expected pharmacological parameters. Duration of action: Within normal limits for medication. Peak effect: Timing and results are as within normal expected parameters. [place] PMP: Compliant with practice rules and regulations. DST: Compliant with practice rules and regulations. Lab work: No new labs ordered by our practice. Treatment compliance: Compliant. Substance Use Disorder (SUD) Risk Level: Low Planned course of action: Continue therapy as is.  Allergies: [name] has No Known Allergies.  Meds: The patient has a current medication list which includes the following prescription(s): aspirin, atorvastatin, clonazepam, gabapentin, nortriptyline, oxycodone-acetaminophen, venlafaxine hcl, and albuterol. Requested Prescriptions   Signed Prescriptions Disp Refills  . oxyCODONE-acetaminophen (PERCOCET) 10-325 MG tablet 120 tablet 0    Sig: Take 1 tablet by mouth every 6 (six) hours as needed.  . gabapentin (NEURONTIN) 300 MG capsule 90 capsule 0    Sig: Take 1 capsule (300 mg total) by mouth 3 (three) times daily.    ROS: Constitutional: Afebrile, no chills, well hydrated and well nourished Gastrointestinal: negative Musculoskeletal:negative Neurological: negative Behavioral/Psych: negative  PFSH: Medical:  [name]  has a past medical history of Prostate cancer ([place]); BPH (benign prostatic hyperplasia); Erectile dysfunction; Premature ejaculation; Hypogonadism in male; Hyperlipidemia; Lower urinary tract infection; TIA (transient ischemic attack); Stroke [facility]); Arthritis; and Depression. Family: family history  includes Heart disease in his father. Surgical:  has past surgical history that includes Tonsillectomy. Tobacco:  reports that he has quit smoking. His smokeless tobacco use includes Chew. Alcohol:  reports that he does not drink alcohol. Drug:  reports that he does not use illicit drugs.  Physical Exam: Vitals:  Today's Vitals   07/28/15 1132 07/28/15 1133  BP:  116/82  Pulse:  92  Temp:  97.9 F (36.6 C)  Resp:  20  Height: 5\' 8"  (1.727 m)   Weight: 145 lb (65.772 kg)   SpO2:  100%  PainSc: 8  8   PainLoc: Hip   Calculated BMI: Body mass index is 22.05 kg/(m^2). General appearance: alert, cooperative, appears stated age and mild distress Eyes: conjunctivae/corneas clear. PERRL, EOM's intact. Fundi benign. Lungs: No evidence respiratory distress, no audible rales or ronchi and no use of accessory muscles of respiration Neck: no adenopathy, no carotid bruit, no JVD, supple, symmetrical, trachea midline and thyroid not enlarged, symmetric, no tenderness/mass/nodules Back: symmetric, no curvature. ROM normal. No CVA tenderness. Extremities: extremities normal, atraumatic, no cyanosis or edema Pulses: 2+ and symmetric Skin: Skin color, texture, turgor normal. No rashes or lesions Neurologic: Gait: Antalgic    Assessment: Encounter Diagnosis:  Primary Diagnosis: Long term current use of opiate analgesic [Z79.891]  Plan: [place] was seen today for hip pain, back pain and leg pain.  Diagnoses and all orders for this visit:  Long term current use of opiate analgesic -     Drugs of abuse screen w/o alc, rtn urine-sln; Future  Long term prescription opiate use  Opiate use  Uncomplicated opioid dependence ([place])  Encounter for therapeutic drug level monitoring  Chronic pain -     oxyCODONE-acetaminophen (PERCOCET) 10-325 MG tablet; Take 1 tablet by mouth every 6 (six) hours as needed. -     gabapentin (NEURONTIN) 300 MG capsule; Take 1 capsule (300 mg total) by mouth 3  (three) times daily. -     COMPLETE METABOLIC PANEL WITH GFR; Future -     C-reactive protein; Future -     Magnesium; Future -     Sedimentation rate; Future -     Vitamin D2,D3 Panel; Future -     B12 and Folate Panel; Future  Chronic low back pain  Chronic radicular lumbar pain -     NCV with EMG(electromyography); Future  Ataxia -     Ambulatory referral to Neurology  Substance use disorder Risk: LOW  Chronic upper back pain  Cervical spondylosis  Chronic neck pain  Cervicogenic headache  Chronic radicular cervical pain  Chronic right hip pain  Generalized anxiety disorder  Depression  At high risk for falls  History of TIA (transient ischemic attack)  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  Abnormal MRI, lumbar spine  Other osteoarthritis of spine, lumbar region     Patient Instructions  Oxycodone extended-release capsules What is this medicine? OXYCODONE (ox i KOE done) is a pain reliever. It is used to treat constant pain that lasts for more than a few days. This medicine may be used for other purposes; ask your health care provider or pharmacist if you have questions. What should I tell my health care provider before I take this medicine? They need to know if you have any of these conditions: -Addison's disease -brain tumor -gallbladder disease -head injury -heart disease -history of a drug or alcohol abuse problem -if you often drink alcohol -kidney disease -liver disease -lung or breathing disease, like asthma -mental illness -pancreatic disease -seizures -stomach or intestine problems -thyroid disease -an unusual or allergic reaction to oxycodone, codeine, hydrocodone,  morphine, other medicines, foods, dyes, or preservatives -pregnant or trying to get pregnant -breast-feeding How should I use this medicine? Take this medicine by mouth with a full glass of water. Follow the  directions on the prescription label. Take this medicine with food. You should always take it with the same amount of food each time. Take your medicine at regular intervals. Do not take it more often than directed. Do not stop taking except on your doctor's advice. A special MedGuide will be given to you by the pharmacist with each prescription and refill. Be sure to read this information carefully each time. Talk to your pediatrician regarding the use of this medicine in children. Special care may be needed. Overdosage: If you think you have taken too much of this medicine contact a poison control center or emergency room at once. NOTE: This medicine is only for you. Do not share this medicine with others. What if I miss a dose? If you miss a dose, take it as soon as you can. If it is almost time for your next dose, take only that dose. Do not take double or extra doses. What may interact with this medicine? This medicine may interact with the following medications: -antihistamines for allergy, cough and cold -antiviral medicines for HIV or AIDS -atropine -certain antibiotics like clarithromycin, erythromycin, linezolid -certain medicines for anxiety or sleep -certain medicines for bladder problems like oxybutynin, tolterodine -certain medicines for depression, like amitriptyline, fluoxetine, sertraline -certain medicines for fungal infections like ketoconazole and itraconazole -certain medicines for migraine headache like almotriptan, eletriptan, frovatriptan, naratriptan, rizatriptan, sumatriptan, zolmitriptan -certain medicines for Parkinson's disease like benztropine, trihexyphenidyl -certain medicines for seizures like carbamazepine, phenobarbital, phenytoin, primidone -certain medicines for stomach problems like dicyclomine, hyoscyamine -certain medicines for travel sickness like scopolamine -diuretics -general anesthetics like halothane, isoflurane, methoxyflurane,  propofol -ipratropium -local anesthetics like lidocaine, pramoxine, tetracaine -MAOIs like Carbex, Eldepryl, Marplan, Nardil, and Parnate -medicines that relax muscles for surgery -narcotic medicines for pain -phenothiazines like chlorpromazine, mesoridazine, prochlorperazine, thioridazine -rifampin This list may not describe all possible interactions. Give your health care provider a list of all the medicines, herbs, non-prescription drugs, or dietary supplements you use. Also tell them if you smoke, drink alcohol, or use illegal drugs. Some items may interact with your medicine. What should I watch for while using this medicine? Tell your doctor or health care professional if your pain does not go away, if it gets worse, or if you have new or a different type of pain. You may develop tolerance to the medicine. Tolerance means that you will need a higher dose of the medication for pain relief. Tolerance is normal and is expected if you take this medicine for a long time. Do not suddenly stop taking your medicine because you may develop a severe reaction. Your body becomes used to the medicine. This does NOT mean you are addicted. Addiction is a behavior related to getting and using a drug for a non-medical reason. If you have pain, you have a medical reason to take pain medicine. Your doctor will tell you how much medicine to take. If your doctor wants you to stop the medicine, the dose will be slowly lowered over time to avoid any side effects. You may get drowsy or dizzy. Do not drive, use machinery, or do anything that needs mental alertness until you know how the medicine affects you. Do not stand or sit up quickly, especially if you are an older patient. This reduces the risk of  dizzy or fainting spells. Alcohol may interfere with the effect of this medicine. Avoid alcoholic drinks. There are different types of narcotic medicines (opiates) for pain. If you take more than one type at the same time,  you may have more side effects. Give your health care provider a list of all medicines you use. Your doctor will tell you how much medicine to take. Do not take more medicine than directed. Call emergency for help if you have problems breathing. This medicine will cause constipation. Try to have a bowel movement at least every 2 to 3 days. If you do not have a bowel movement for 3 days, call your doctor or health care professional. Your mouth may get dry. Chewing sugarless gum or sucking on hard candy, and drinking plenty of water may help. Contact your doctor if the problem does not go away or is severe. What side effects may I notice from receiving this medicine? Side effects that you should report to your doctor or health care professional as soon as possible: -allergic reactions like skin rash, itching or hives, swelling of the face, lips, or tongue -breathing problems -confusion -feeling faint or lightheaded, falls -stomach pain -trouble passing urine or change in the amount of urine -trouble swallowing -unusually weak or tired Side effects that usually do not require medical attention (report these to your doctor or health care professional if they continue or are bothersome): -constipation -dizziness -dry mouth -itching -nausea, vomiting -tiredness -upset stomach This list may not describe all possible side effects. Call your doctor for medical advice about side effects. You may report side effects to FDA at 1-800-FDA-1088. Where should I keep my medicine? Keep out of the reach of children. This medicine can be abused. Keep your medicine in a safe place to protect it from theft. Do not share this medicine with anyone. Selling or giving away this medicine is dangerous and against the law. Follow the directions in the [place]. Store at room temperature between 15 and 30 degrees C (59 and 86 degrees F). Protect from light. Keep container tightly closed. This medicine may cause  accidental overdose and death if it is taken by other adults, children, or pets. Flush any unused medicine down the toilet to reduce the chance of harm. Do not use the medicine after the expiration date. NOTE: This sheet is a summary. It may not cover all possible information. If you have questions about this medicine, talk to your doctor, pharmacist, or health care provider.    2016, Elsevier/Gold Standard. (2015-01-23 16:05:07)   Medications discontinued today:  Medications Discontinued During This Encounter  Medication Reason  . CAVERJECT IMPULSE 20 MCG injection Error  . diazepam (VALIUM) 5 MG tablet Error  . ipratropium-albuterol (DUONEB) 0.5-2.5 (3) MG/3ML SOLN Error  . nortriptyline (PAMELOR) 10 MG capsule Error  . nortriptyline (PAMELOR) 50 MG capsule Error  . Umeclidinium-Vilanterol (ANORO ELLIPTA) 62.5-25 MCG/INH AEPB Error  . oxyCODONE-acetaminophen (PERCOCET) 10-325 MG per tablet Reorder  . gabapentin (NEURONTIN) 300 MG capsule Reorder  . alprostadil (EDEX) 20 MCG injection Error   Medications administered today:  Mr. Bartoletti had no medications administered during this visit.  Primary Care Physician: Tracie Harrier, MD Location: [facility] Note by: Kathlen Brunswick. Dossie Arbour, M.D, DABA, DABAPM, DABPM, DABIPP, FIPP

## 2015-08-06 ENCOUNTER — Ambulatory Visit (INDEPENDENT_AMBULATORY_CARE_PROVIDER_SITE_OTHER): Payer: BLUE CROSS/BLUE SHIELD | Admitting: Urology

## 2015-08-06 ENCOUNTER — Encounter: Payer: Self-pay | Admitting: Urology

## 2015-08-06 VITALS — BP 133/82 | HR 111 | Ht 68.0 in | Wt 137.7 lb

## 2015-08-06 DIAGNOSIS — N528 Other male erectile dysfunction: Secondary | ICD-10-CM | POA: Diagnosis not present

## 2015-08-06 DIAGNOSIS — C61 Malignant neoplasm of prostate: Secondary | ICD-10-CM | POA: Diagnosis not present

## 2015-08-06 LAB — URINALYSIS, COMPLETE
BILIRUBIN UA: NEGATIVE
Ketones, UA: NEGATIVE
LEUKOCYTES UA: NEGATIVE
Nitrite, UA: NEGATIVE
PH UA: 5.5 (ref 5.0–7.5)
PROTEIN UA: NEGATIVE
Specific Gravity, UA: 1.02 (ref 1.005–1.030)
UUROB: 0.2 mg/dL (ref 0.2–1.0)

## 2015-08-06 LAB — MICROSCOPIC EXAMINATION
BACTERIA UA: NONE SEEN
Epithelial Cells (non renal): NONE SEEN /hpf (ref 0–10)

## 2015-08-06 NOTE — Progress Notes (Signed)
10:08 AM  08/06/15  Mark Hodges 02/07/1956 [id number]  Referring provider: Tracie Harrier, MD [address] [facility] [place], [place] [address]  Chief Complaint  Patient presents with  . Prostate Cancer    56month with PSA & DRE    HPI:  59 yo M with diagnosed in 01/2014 with T1c Gleason 3+3 prostate cancer in 2/12 cores, 1-3% and single core HGPIN dx 01/2014. PSA at time of dx 5.41. TRUS vol 34 cc. He has been on active surveillance.    Repeat biopsy on 02/20/15 per active surveillance protocol which showed only a small focus of atypical glands, suspicious for carcinoma   TRUS vol 33 cc.   His most recent PSA was 7.0 in May 6th 2016/ rectal exam normal.  Also complains of LUTS, urinary frequency. Not taking any prostate meds. He was previously prescribed Flomax back in 9/ 2015 but never took this medication.  Baseline ED, failed multiple PDE5i. No sucess with VED.  Currently good success with caverject 10 mcg.   PMH: Past Medical History  Diagnosis Date  . Prostate cancer ([place])   . BPH (benign prostatic hyperplasia)   . Erectile dysfunction   . Premature ejaculation   . Hypogonadism in male   . Hyperlipidemia   . Lower urinary tract infection   . TIA (transient ischemic attack)   . Stroke ([place])   . Arthritis   . Depression     Surgical History: Past Surgical History  Procedure Laterality Date  . Tonsillectomy      Home Medications:    Medication List       This list is accurate as of: 08/06/15 10:08 AM.  Always use your most recent med list.               aspirin 325 MG tablet  Take by mouth.     atorvastatin 10 MG tablet  Commonly known as:  LIPITOR  Take 10 mg by mouth daily.     clonazePAM 0.5 MG tablet  Commonly known as:  KLONOPIN  Take 0.5 mg by mouth 4 (four) times daily.     diazepam 5 MG tablet  Commonly known as:  VALIUM  TAKE 1 TABLET BY MOUTH EVERY 12 HOURS AS NEEDED FOR MUSCLE SPASMS     gabapentin  300 MG capsule  Commonly known as:  NEURONTIN  Take 1 capsule (300 mg total) by mouth 3 (three) times daily.     nortriptyline 10 MG capsule  Commonly known as:  PAMELOR  Take 10 mg by mouth at bedtime.     oxyCODONE-acetaminophen 10-325 MG tablet  Commonly known as:  PERCOCET  Take 1 tablet by mouth every 6 (six) hours as needed.     PROAIR HFA 108 (90 BASE) MCG/ACT inhaler  Generic drug:  albuterol  Inhale into the lungs.     Venlafaxine HCl 225 MG Tb24  Take 1 tablet by mouth daily.        Allergies: No Known Allergies  Family History: Family History  Problem Relation Age of Onset  . Heart disease Father     Social History:  reports that he has quit smoking. His smokeless tobacco use includes Chew. He reports that he does not drink alcohol or use illicit drugs.   ROS: UROLOGY Frequent Urination?: Yes Hard to postpone urination?: Yes Burning/pain with urination?: Yes Get up at night to urinate?: Yes Leakage of urine?: No Urine stream starts and stops?: Yes Trouble starting stream?:  Yes Do you have to strain to urinate?: Yes Blood in urine?: No Urinary tract infection?: No Sexually transmitted disease?: No Injury to kidneys or bladder?: No Painful intercourse?: No Weak stream?: Yes Erection problems?: Yes Penile pain?: No Gastrointestinal Nausea?: No Vomiting?: No Indigestion/heartburn?: No Diarrhea?: No Constipation?: No Constitutional Fever: No Night sweats?: No Weight loss?: Yes Fatigue?: Yes Skin Skin rash/lesions?: No Itching?: No Eyes Blurred vision?: Yes Double vision?: No Ears/Nose/Throat Sore throat?: No Sinus problems?: No Hematologic/Lymphatic Swollen glands?: No Easy bruising?: No Cardiovascular Leg swelling?: No Chest pain?: No Respiratory Cough?: No Shortness of breath?: Yes Endocrine Excessive thirst?: No Musculoskeletal Back pain?: Yes Joint pain?: Yes Neurological Headaches?: Yes Dizziness?:  Yes Psychologic Depression?: Yes Anxiety?: Yes   Physical Exam: BP 133/82 mmHg  Pulse 111  Ht 5\' 8"  (1.727 m)  Wt 137 lb 11.2 oz (62.46 kg)  BMI 20.94 kg/m2  Constitutional:  Alert and oriented, No acute distress. Ambulating with cane. HEENT: [place] AT, moist mucus membranes.  Trachea midline, no masses. Cardiovascular: No clubbing, cyanosis, or edema. Respiratory: Normal respiratory effort, no increased work of breathing. GI: Abdomen is soft, nontender, nondistended, no abdominal masses GU: No CVA tenderness.  30g gland, no nodules.  Normal external sphincter.   Skin: No rashes, bruises or suspicious lesions. Neurologic: Grossly intact, no focal deficits, moving all 4 extremities. Psychiatric: Normal mood and affect.    Assessment & Plan:   1. Prostate cancer  Diagnosed in 01/2014 with T1c Gleason 3+3 prostate cancer in 2/12 cores, 1-3% and single core HGPIN dx 01/2014. PSA at time of dx 5.41. Repeat bx 01/2015,  Single focus of atypical cells but no evidence of clinically signficant cancer despite rising PSA to 7.0.  PSA today/ normal DRE. Recommend continuation of active surveillance.   Will contact patient via mychart with PSA results.  -plan for PSA/ DRE in 4 months if stable today  2. Other male erectile dysfunction Continue Caverject 10 mcg  Return in about 4 months (around 12/04/2015), or PSA/ DRE.  Hollice Espy, MD  [facility] [address] [place],  [address] [phone]

## 2015-08-07 ENCOUNTER — Telehealth: Payer: Self-pay

## 2015-08-07 LAB — PSA: PROSTATE SPECIFIC AG, SERUM: 6.8 ng/mL — AB (ref 0.0–4.0)

## 2015-08-07 NOTE — Telephone Encounter (Signed)
-----   Message from Hollice Espy, MD sent at 08/07/2015  8:29 AM EST ----- Mark Hodges was going to sign up for Mychart but has not yet done so ( I had planned to message him over mychart).  Please let him know his PSA is stable at 6.8 (from 7.0) last visit.    Plan to see him back in 4 months as scheduled.    Hollice Espy, MD

## 2015-08-07 NOTE — Telephone Encounter (Signed)
Spoke with pt in reference to PSA. Pt voiced understanding.  

## 2015-08-10 ENCOUNTER — Ambulatory Visit: Payer: BLUE CROSS/BLUE SHIELD | Admitting: Pain Medicine

## 2015-08-10 ENCOUNTER — Telehealth: Payer: Self-pay | Admitting: Pain Medicine

## 2015-08-10 NOTE — Telephone Encounter (Signed)
Wants to go ahead with Dr. Adalberto Cole plans for treatment/ does not want to see dr Melrose Nakayama any longer/ please call him with appts that need to be sched

## 2015-09-09 DIAGNOSIS — M5410 Radiculopathy, site unspecified: Secondary | ICD-10-CM | POA: Insufficient documentation

## 2015-10-28 ENCOUNTER — Other Ambulatory Visit: Payer: Self-pay | Admitting: Pain Medicine

## 2015-10-28 ENCOUNTER — Encounter (INDEPENDENT_AMBULATORY_CARE_PROVIDER_SITE_OTHER): Payer: Self-pay

## 2015-10-28 ENCOUNTER — Ambulatory Visit: Payer: BLUE CROSS/BLUE SHIELD | Attending: Pain Medicine | Admitting: Pain Medicine

## 2015-10-28 ENCOUNTER — Encounter: Payer: Self-pay | Admitting: Pain Medicine

## 2015-10-28 DIAGNOSIS — M25559 Pain in unspecified hip: Secondary | ICD-10-CM | POA: Diagnosis present

## 2015-10-28 DIAGNOSIS — G8929 Other chronic pain: Secondary | ICD-10-CM | POA: Diagnosis not present

## 2015-10-28 DIAGNOSIS — Z8546 Personal history of malignant neoplasm of prostate: Secondary | ICD-10-CM | POA: Diagnosis not present

## 2015-10-28 DIAGNOSIS — M549 Dorsalgia, unspecified: Secondary | ICD-10-CM | POA: Diagnosis present

## 2015-10-28 DIAGNOSIS — M542 Cervicalgia: Secondary | ICD-10-CM | POA: Insufficient documentation

## 2015-10-28 DIAGNOSIS — M47816 Spondylosis without myelopathy or radiculopathy, lumbar region: Secondary | ICD-10-CM

## 2015-10-28 DIAGNOSIS — M792 Neuralgia and neuritis, unspecified: Secondary | ICD-10-CM

## 2015-10-28 DIAGNOSIS — M48061 Spinal stenosis, lumbar region without neurogenic claudication: Secondary | ICD-10-CM

## 2015-10-28 DIAGNOSIS — J449 Chronic obstructive pulmonary disease, unspecified: Secondary | ICD-10-CM | POA: Insufficient documentation

## 2015-10-28 DIAGNOSIS — M4806 Spinal stenosis, lumbar region: Secondary | ICD-10-CM | POA: Diagnosis not present

## 2015-10-28 DIAGNOSIS — M5410 Radiculopathy, site unspecified: Secondary | ICD-10-CM | POA: Diagnosis not present

## 2015-10-28 DIAGNOSIS — E785 Hyperlipidemia, unspecified: Secondary | ICD-10-CM | POA: Insufficient documentation

## 2015-10-28 DIAGNOSIS — F329 Major depressive disorder, single episode, unspecified: Secondary | ICD-10-CM | POA: Diagnosis not present

## 2015-10-28 DIAGNOSIS — Z87891 Personal history of nicotine dependence: Secondary | ICD-10-CM | POA: Diagnosis not present

## 2015-10-28 DIAGNOSIS — N400 Enlarged prostate without lower urinary tract symptoms: Secondary | ICD-10-CM | POA: Diagnosis not present

## 2015-10-28 DIAGNOSIS — N529 Male erectile dysfunction, unspecified: Secondary | ICD-10-CM | POA: Insufficient documentation

## 2015-10-28 DIAGNOSIS — E7800 Pure hypercholesterolemia, unspecified: Secondary | ICD-10-CM | POA: Insufficient documentation

## 2015-10-28 DIAGNOSIS — Z8673 Personal history of transient ischemic attack (TIA), and cerebral infarction without residual deficits: Secondary | ICD-10-CM | POA: Diagnosis not present

## 2015-10-28 DIAGNOSIS — F419 Anxiety disorder, unspecified: Secondary | ICD-10-CM | POA: Diagnosis not present

## 2015-10-28 DIAGNOSIS — M79606 Pain in leg, unspecified: Secondary | ICD-10-CM | POA: Diagnosis present

## 2015-10-28 DIAGNOSIS — R937 Abnormal findings on diagnostic imaging of other parts of musculoskeletal system: Secondary | ICD-10-CM

## 2015-10-28 DIAGNOSIS — G629 Polyneuropathy, unspecified: Secondary | ICD-10-CM | POA: Insufficient documentation

## 2015-10-28 DIAGNOSIS — M25551 Pain in right hip: Secondary | ICD-10-CM | POA: Diagnosis not present

## 2015-10-28 DIAGNOSIS — R51 Headache: Secondary | ICD-10-CM | POA: Insufficient documentation

## 2015-10-28 DIAGNOSIS — M79604 Pain in right leg: Secondary | ICD-10-CM | POA: Insufficient documentation

## 2015-10-28 DIAGNOSIS — M1288 Other specific arthropathies, not elsewhere classified, other specified site: Secondary | ICD-10-CM | POA: Insufficient documentation

## 2015-10-28 MED ORDER — GABAPENTIN 300 MG PO CAPS: 900.0000 mg | ORAL_CAPSULE | Freq: Three times a day (TID) | ORAL | Status: DC

## 2015-10-28 NOTE — Progress Notes (Signed)
Patient here for medication refill.  Is having some stability issues and Dr Melrose Nakayama is going to setup an MRI to see why this is happening based on an assessment performed on yesterday at his office visit.    Patient is out of Percocet, has empty bottle with him today.  States that pain is really not helping any longer.

## 2015-10-28 NOTE — Patient Instructions (Addendum)
Hip Pain Your hip is the joint between your upper legs and your lower pelvis. The bones, cartilage, tendons, and muscles of your hip joint perform a lot of work each day supporting your body weight and allowing you to move around. Hip pain can range from a minor ache to severe pain in one or both of your hips. Pain may be felt on the inside of the hip joint near the groin, or the outside near the buttocks and upper thigh. You may have swelling or stiffness as well.  HOME CARE INSTRUCTIONS   Take medicines only as directed by your health care provider.  Apply ice to the injured area:  Put ice in a plastic bag.  Place a towel between your skin and the bag.  Leave the ice on for 15-20 minutes at a time, 3-4 times a day.  Keep your leg raised (elevated) when possible to lessen swelling.  Avoid activities that cause pain.  Follow specific exercises as directed by your health care provider.  Sleep with a pillow between your legs on your most comfortable side.  Record how often you have hip pain, the location of the pain, and what it feels like. SEEK MEDICAL CARE IF:   You are unable to put weight on your leg.  Your hip is red or swollen or very tender to touch.  Your pain or swelling continues or worsens after 1 week.  You have increasing difficulty walking.  You have a fever. SEEK IMMEDIATE MEDICAL CARE IF:   You have fallen.  You have a sudden increase in pain and swelling in your hip. MAKE SURE YOU:   Understand these instructions.  Will watch your condition.  Will get help right away if you are not doing well or get worse.   This information is not intended to replace advice given to you by your health care provider. Make sure you discuss any questions you have with your health care provider.   Document Released: 03/02/2010 Document Revised: 10/03/2014 Document Reviewed: 05/09/2013 Elsevier Interactive Patient Education 2016 [place]  What are the risk, side effects and possible complications? Generally speaking, most procedures are safe.  However, with any procedure there are risks, side effects, and the possibility of complications.  The risks and complications are dependent upon the sites that are lesioned, or the type of nerve block to be performed.  The closer the procedure is to the spine, the more serious the risks are.  Great care is taken when placing the radio frequency needles, block needles or lesioning probes, but sometimes complications can occur.  Infection: Any time there is an injection through the skin, there is a risk of infection.  This is why sterile conditions are used for these blocks.  There are four possible types of infection.  Localized skin infection.  Central Nervous System Infection-This can be in the form of Meningitis, which can be deadly.  Epidural Infections-This can be in the form of an epidural abscess, which can cause pressure inside of the spine, causing compression of the spinal cord with subsequent paralysis. This would require an emergency surgery to decompress, and there are no guarantees that the patient would recover from the paralysis.  Discitis-This is an infection of the intervertebral discs.  It occurs in about 1% of discography procedures.  It is difficult to treat and it may lead to surgery.        2. Pain: the needles have to go through skin  and soft tissues, will cause soreness.       3. Damage to internal structures:  The nerves to be lesioned may be near blood vessels or    other nerves which can be potentially damaged.       4. Bleeding: Bleeding is more common if the patient is taking blood thinners such as  aspirin, Coumadin, Ticiid, Plavix, etc., or if he/she have some genetic predisposition  such as hemophilia. Bleeding into the spinal canal can cause compression of the spinal  cord with subsequent paralysis.  This would require an emergency surgery to    decompress and there are no guarantees that the patient would recover from the  paralysis.       5. Pneumothorax:  Puncturing of a lung is a possibility, every time a needle is introduced in  the area of the chest or upper back.  Pneumothorax refers to free air around the  collapsed lung(s), inside of the thoracic cavity (chest cavity).  Another two possible  complications related to a similar event would include: Hemothorax and Chylothorax.   These are variations of the Pneumothorax, where instead of air around the collapsed  lung(s), you may have blood or chyle, respectively.       6. Spinal headaches: They may occur with any procedures in the area of the spine.       7. Persistent CSF (Cerebro-Spinal Fluid) leakage: This is a rare problem, but may occur  with prolonged intrathecal or epidural catheters either due to the formation of a fistulous  track or a dural tear.       8. Nerve damage: By working so close to the spinal cord, there is always a possibility of  nerve damage, which could be as serious as a permanent spinal cord injury with  paralysis.       9. Death:  Although rare, severe deadly allergic reactions known as "Anaphylactic  reaction" can occur to any of the medications used.      10. Worsening of the symptoms:  We can always make thing worse.  What are the chances of something like this happening? Chances of any of this occuring are extremely low.  By statistics, you have more of a chance of getting killed in a motor vehicle accident: while driving to the hospital than any of the above occurring .  Nevertheless, you should be aware that they are possibilities.  In general, it is similar to taking a shower.  Everybody knows that you can slip, hit your head and get killed.  Does that mean that you should not shower again?  Nevertheless always keep in mind that statistics do not mean anything if you happen to be on the wrong side of them.  Even if a procedure has a 1 (one) in a 1,000,000  (million) chance of going wrong, it you happen to be that one..Also, keep in mind that by statistics, you have more of a chance of having something go wrong when taking medications.  Who should not have this procedure? If you are on a blood thinning medication (e.g. Coumadin, Plavix, see list of "Blood Thinners"), or if you have an active infection going on, you should not have the procedure.  If you are taking any blood thinners, please inform your physician.  How should I prepare for this procedure?  Do not eat or drink anything at least six hours prior to the procedure.  Bring a driver with you .  It cannot be a  taxi.  Come accompanied by an adult that can drive you back, and that is strong enough to help you if your legs get weak or numb from the local anesthetic.  Take all of your medicines the morning of the procedure with just enough water to swallow them.  If you have diabetes, make sure that you are scheduled to have your procedure done first thing in the morning, whenever possible.  If you have diabetes, take only half of your insulin dose and notify our nurse that you have done so as soon as you arrive at the clinic.  If you are diabetic, but only take blood sugar pills (oral hypoglycemic), then do not take them on the morning of your procedure.  You may take them after you have had the procedure.  Do not take aspirin or any aspirin-containing medications, at least eleven (11) days prior to the procedure.  They may prolong bleeding.  Wear loose fitting clothing that may be easy to take off and that you would not mind if it got stained with Betadine or blood.  Do not wear any jewelry or perfume  Remove any nail coloring.  It will interfere with some of our monitoring equipment.  NOTE: Remember that this is not meant to be interpreted as a complete list of all possible complications.  Unforeseen problems may occur.  BLOOD THINNERS The following drugs contain aspirin or other  products, which can cause increased bleeding during surgery and should not be taken for 2 weeks prior to and 1 week after surgery.  If you should need take something for relief of minor pain, you may take acetaminophen which is found in Tylenol,m Datril, Anacin-3 and Panadol. It is not blood thinner. The products listed below are.  Do not take any of the products listed below in addition to any listed on your instruction sheet.  A.P.C or A.P.C with Codeine Codeine Phosphate Capsules #3 Ibuprofen Ridaura  ABC compound Congesprin Imuran rimadil  Advil Cope Indocin Robaxisal  Alka-Seltzer Effervescent Pain Reliever and Antacid Coricidin or Coricidin-D  Indomethacin Rufen  Alka-Seltzer plus Cold Medicine Cosprin Ketoprofen S-A-C Tablets  Anacin Analgesic Tablets or Capsules Coumadin Korlgesic Salflex  Anacin Extra Strength Analgesic tablets or capsules CP-2 Tablets Lanoril Salicylate  Anaprox Cuprimine Capsules Levenox Salocol  Anexsia-D Dalteparin Magan Salsalate  Anodynos Darvon compound Magnesium Salicylate Sine-off  Ansaid Dasin Capsules Magsal Sodium Salicylate  Anturane Depen Capsules Marnal Soma  APF Arthritis pain formula Dewitt's Pills Measurin Stanback  Argesic Dia-Gesic Meclofenamic Sulfinpyrazone  Arthritis Bayer Timed Release Aspirin Diclofenac Meclomen Sulindac  Arthritis pain formula Anacin Dicumarol Medipren Supac  Analgesic (Safety coated) Arthralgen Diffunasal Mefanamic Suprofen  Arthritis Strength Bufferin Dihydrocodeine Mepro Compound Suprol  Arthropan liquid Dopirydamole Methcarbomol with Aspirin Synalgos  ASA tablets/Enseals Disalcid Micrainin Tagament  Ascriptin Doan's Midol Talwin  Ascriptin A/D Dolene Mobidin Tanderil  Ascriptin Extra Strength Dolobid Moblgesic Ticlid  Ascriptin with Codeine Doloprin or Doloprin with Codeine Momentum Tolectin  Asperbuf Duoprin Mono-gesic Trendar  Aspergum Duradyne Motrin or Motrin IB Triminicin  Aspirin plain, buffered or enteric  coated Durasal Myochrisine Trigesic  Aspirin Suppositories Easprin Nalfon Trillsate  Aspirin with Codeine Ecotrin Regular or Extra Strength Naprosyn Uracel  Atromid-S Efficin Naproxen Ursinus  Auranofin Capsules Elmiron Neocylate Vanquish  Axotal Emagrin Norgesic Verin  Azathioprine Empirin or Empirin with Codeine Normiflo Vitamin E  Azolid Emprazil Nuprin Voltaren  Bayer Aspirin plain, buffered or children's or timed BC Tablets or powders Encaprin Orgaran Warfarin Sodium  Buff-a-Comp Enoxaparin Orudis  Zorpin  Buff-a-Comp with Codeine Equegesic Os-Cal-Gesic   Buffaprin Excedrin plain, buffered or Extra Strength Oxalid   Bufferin Arthritis Strength Feldene Oxphenbutazone   Bufferin plain or Extra Strength Feldene Capsules Oxycodone with Aspirin   Bufferin with Codeine Fenoprofen Fenoprofen Pabalate or Pabalate-SF   Buffets II Flogesic Panagesic   Buffinol plain or Extra Strength Florinal or Florinal with Codeine Panwarfarin   Buf-Tabs Flurbiprofen Penicillamine   Butalbital Compound Four-way cold tablets Penicillin   Butazolidin Fragmin Pepto-Bismol   Carbenicillin Geminisyn Percodan   Carna Arthritis Reliever Geopen Persantine   Carprofen Gold's salt Persistin   Chloramphenicol Goody's Phenylbutazone   Chloromycetin Haltrain Piroxlcam   Clmetidine heparin Plaquenil   Cllnoril Hyco-pap Ponstel   Clofibrate Hydroxy chloroquine Propoxyphen         Before stopping any of these medications, be sure to consult the physician who ordered them.  Some, such as Coumadin (Warfarin) are ordered to prevent or treat serious conditions such as "deep thrombosis", "pumonary embolisms", and other heart problems.  The amount of time that you may need off of the medication may also vary with the medication and the reason for which you were taking it.  If you are taking any of these medications, please make sure you notify your pain physician before you undergo any procedures.         Instructions  foe procedure Do not eat or drink for 6 hours Bring a driver

## 2015-10-29 ENCOUNTER — Other Ambulatory Visit: Payer: Self-pay | Admitting: Neurology

## 2015-10-29 DIAGNOSIS — M79604 Pain in right leg: Secondary | ICD-10-CM

## 2015-10-29 DIAGNOSIS — Z8546 Personal history of malignant neoplasm of prostate: Secondary | ICD-10-CM | POA: Insufficient documentation

## 2015-10-29 DIAGNOSIS — M48061 Spinal stenosis, lumbar region without neurogenic claudication: Secondary | ICD-10-CM | POA: Insufficient documentation

## 2015-10-29 DIAGNOSIS — M47816 Spondylosis without myelopathy or radiculopathy, lumbar region: Secondary | ICD-10-CM | POA: Insufficient documentation

## 2015-10-29 DIAGNOSIS — G8929 Other chronic pain: Secondary | ICD-10-CM | POA: Insufficient documentation

## 2015-10-29 DIAGNOSIS — R519 Headache, unspecified: Secondary | ICD-10-CM

## 2015-10-29 DIAGNOSIS — R51 Headache: Principal | ICD-10-CM

## 2015-10-29 NOTE — Progress Notes (Signed)
Patient's Name: Mark Hodges MRN: [id number] DOB: 06-05-1956 DOS: 10/28/2015  Primary Reason(s) for Visit: Encounter for Medication Management CC: Back Pain; Hip Pain; and Leg Pain   HPI  Mark Hodges is a 60 y.o. year old, male patient, who returns today as an established patient. He has COPD, mild ([place]); Clinical depression; Difficulty in walking; Compulsive tobacco user syndrome; Hypercholesterolemia without hypertriglyceridemia; Tingling; Has a tremor; Long term current use of opiate analgesic; Long term prescription opiate use; Opiate use; Opiate dependence ([place]); Encounter for therapeutic drug level monitoring; Chronic pain; Chronic low back pain (Location of Secondary source of pain) (Bilateral) (R>L); Chronic lumbar radicular pain (Polyradiculopathy) (Right); Ataxia; HLD (hyperlipidemia); Current tobacco use; Pure hypercholesterolemia; Arm numbness; Substance use disorder Risk: LOW; Chronic upper back pain; Cervical spondylosis; Chronic neck pain; Cervicogenic headache; Chronic radicular cervical pain; Chronic hip pain (Location of Primary Source of Pain) (Right); Generalized anxiety disorder; Depression; At high risk for falls; Erectile dysfunction; History of TIA (transient ischemic attack); 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 His primarily concern today is the Back Pain; Hip Pain; and Leg Pain   The patient returns to the clinics today for pharmacological management of his chronic pain. The patient indicates that his primary pain is in the area of the right hip followed by the low back pain which is bilateral but with the right being worst on the  left. A recent nerve conduction test conducted by Dr. Melrose Nakayama [facility] neurology) reveals a mid to lower, right sided, polyradiculopathy. The patient does have an abnormal lumbar MRI which shows a disc extrusion at the T12-L1 level, but it also shows bilateral Hodges-4 foraminal stenosis with the right side being worst than the left and possibly affecting the right Hodges nerve root.  The patient has pain going down to the hip, which is partially innervated by Mark Hodges. He indicates that sitting in a top does feel. He also indicates that on 08/19/2015 he failed. He had an appointment to see Dr. Leeroy Cha, but was not able to keep it due to a conflicting appointment with the oncologist for his prostate cancer.  The patient has seen other pain physicians such as Dr. Vira Blanco and Dr. Jacelyn Grip. According to him, Dr. Jacelyn Grip told him that he had arthritis. The patient's oncologist is Dr. Hollice Espy at [facility] urological. Today the patient returns indicating that the pain medication did not work for his pain. In view of this, we have discontinued the opioid.  Reported Pain Score: 8 , clinically he looks like a 3-4/10. Reported level is inconsistent with clinical obrservations. Pain Type: Chronic pain Pain Location: Back (right hip and leg) Pain Orientation: Lower Pain Descriptors / Indicators: Stabbing (the shooting pain down R leg began occurring since December) Pain Frequency: Intermittent  Date of Last Visit: 07/28/15 Service Provided on Last Visit: Med Refill  Pharmacotherapy  Medication(s): Percocet 10/325 one every 6 hours when necessary for pain. Onset of action: Within expected pharmacological parameters Time to Peak effect: Timing and results are as within normal expected parameters Analgesic Effect: No relief according to the patient. Activity Facilitation: Medication(s) allow patient to sit, stand, walk, and do the basic ADLs Perceived Effectiveness: Described as relatively effective,  allowing for  increase in activities of daily living (ADL) Side-effects or Adverse reactions: None reported Duration of action: Within normal limits for medication [place] PMP: Compliant with practice rules and regulations UDS Results: No UDS available UDS Interpretation: No UDS available, at this time Medication Assessment Form: Reviewed. Patient indicates being compliant with therapy Treatment compliance: Compliant Substance Use Disorder (SUD) Risk Level: Low Pharmacologic Plan: In view of the patient reports that the pain medication did not help with the pain, we have completely stopped it. We did a 30 day trial with oxycodone 10 mg 4 times a day (40 mg per day). This is an MME of 60 mg per day, which should be sufficient to curtail some of the pain, if it was responsive to opioids.  Lab Work: Illicit Drugs No results found for: THCU, COCAINSCRNUR, PCPSCRNUR, MDMA, AMPHETMU, METHADONE, ETOH  Inflammation Markers No results found for: ESRSEDRATE, CRP  Renal Function No results found for: BUN, CREATININE, GFRAA, GFRNONAA  Hepatic Function No results found for: AST, ALT, ALBUMIN  Electrolytes No results found for: NA, K, CL, CALCIUM, MG  Allergies  Mark Hodges has No Known Allergies.  Meds  The patient has a current medication list which includes the following prescription(s): aspirin, atorvastatin, gabapentin, nortriptyline, oxycodone-acetaminophen, venlafaxine hcl, and albuterol.  Current Outpatient Prescriptions on File Prior to Visit  Medication Sig  . aspirin 325 MG tablet Take by mouth.  Marland Kitchen atorvastatin (LIPITOR) 10 MG tablet Take 10 mg by mouth daily.   . nortriptyline (PAMELOR) 10 MG capsule Take 20 mg by mouth at bedtime.   Marland Kitchen oxyCODONE-acetaminophen (PERCOCET) 10-325 MG tablet Take 1 tablet by mouth every 6 (six) hours as needed.  . Venlafaxine HCl 225 MG TB24 Take 2 tablets by mouth daily.   Marland Kitchen albuterol (PROAIR HFA) 108 (90 BASE) MCG/ACT inhaler Inhale into the lungs.   No  current facility-administered medications on file prior to visit.    ROS  Constitutional: Afebrile, no chills, well hydrated and well nourished Gastrointestinal: negative Musculoskeletal:negative Neurological: negative Behavioral/Psych: negative  PFSH  Medical:  Mr. Hubby  has a past medical history of Prostate cancer ([place]); BPH (benign prostatic hyperplasia); Erectile dysfunction; Premature ejaculation; Hypogonadism in male; Hyperlipidemia; Lower urinary tract infection; TIA (transient ischemic attack); Stroke [facility]); Arthritis; and Depression. Family: family history includes Heart disease in his father. Surgical:  has past surgical history that includes Tonsillectomy. Tobacco:  reports that he has quit smoking. His smokeless tobacco use includes Chew. Alcohol:  reports that he does not drink alcohol. Drug:  reports that he does not use illicit drugs.  Physical Exam  Vitals:  Today's Vitals   10/28/15 0933  PainSc: 8     Calculated BMI: There is no weight on file to calculate BMI.  General appearance: alert, cooperative, appears stated age and mild distress Eyes: PERLA Respiratory: No evidence respiratory distress, no audible rales or ronchi and no use of accessory muscles of respiration  Cervical Spine Inspection: Normal anatomy Alignment: Symetrical ROM: Decreased  Upper Extremities Inspection: No gross anomalies detected ROM: Adequate Sensory: Normal Motor: Unremarkable  Thoracic Spine Inspection: No gross anomalies detected Alignment: Symetrical ROM: Adequate Palpation: WNL  Lumbar Spine Inspection: No gross anomalies detected Alignment: Symetrical ROM: Decreased Gait: Antalgic (limping). The patient uses a walker to ambulate.  Lower Extremities Inspection: No gross anomalies detected ROM: Decreased right hip joint. Sensory:  Normal Motor: Guarding  Assessment & Plan  Primary Diagnosis & Pertinent Problem List: The primary encounter diagnosis was  Chronic pain. 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  Visit Diagnosis: 1. Chronic pain   2. Neuropathic pain   3. Chronic hip pain (Right)   4. Polyradiculopathy   5. Chronic lower extremity pain (Right)   6. History of prostate cancer   7. Abnormal MRI, lumbar spine   8. Lumbar facet arthropathy   9. 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   Plan of Care  Pharmacotherapy (Medications Ordered): Meds ordered this encounter  Medications  . gabapentin (NEURONTIN) 300 MG capsule    Sig: Take 3 capsules (900 mg total) by mouth 3 (three) times daily. Follow titration schedule.    Dispense:  810 capsule    Refill:  0    This is a 90 day refill to be mailed out.    Lab-work & Procedure Ordered: Orders Placed This Encounter  Procedures  . HIP INJECTION    Standing Status: Future     Number of Occurrences:      Standing Expiration Date: 10/27/2016    Scheduling Instructions:     Side: Right-sided     Sedation: With Sedation.     Timeframe: ASAA  . Ambulatory referral to Neurosurgery    Referral Priority:  Routine    Referral Type:  Surgical    Referral Reason:  Specialty Services Required    Referred to Provider:  Leeroy Cha, MD    Requested Specialty:  Neurosurgery    Number of Visits Requested:  1    Imaging Ordered: AMB REFERRAL TO NEUROSURGERY  Interventional Therapies: Scheduled: Diagnostic, Right intra-articular hip injection under fluoroscopic guidance and IV sedation. This diagnostic injection will help [place] determine if the pain is coming from the hip joint itself or if it's going from the back. PRN Procedures: Diagnostic  right Hodges-4 lumbar epidural steroid injection under fluoroscopic guidance with or without sedation, versus bilateral Hodges transforaminal epidural steroid injection under fluoroscopic guidance.    Referral(s) or Consult(s): Referral to neurosurgery (Dr. Leeroy Cha) home the patient had an appointment with, but was unable to keep due to conflict with an appointment for his prostate cancer. (Oncology appointment)  Medications administered during this visit: Mr. Fearrington had no medications administered during this visit.  Future Appointments Date Time Provider [place]  11/03/2015 9:40 AM Milinda Pointer, MD [facility] None  11/24/2015 10:15 AM [facility]  12/04/2015 10:30 AM Hollice Espy, MD [facility] None    Primary Care Physician: Tracie Harrier, MD Location: [facility] Facility Note by: Kathlen Brunswick. Dossie Arbour, M.D, DABA, DABAPM, DABPM, DABIPP, FIPP

## 2015-11-03 ENCOUNTER — Ambulatory Visit: Payer: BLUE CROSS/BLUE SHIELD | Attending: Pain Medicine | Admitting: Pain Medicine

## 2015-11-03 ENCOUNTER — Encounter: Payer: Self-pay | Admitting: Pain Medicine

## 2015-11-03 VITALS — BP 121/65 | HR 103 | Temp 98.6°F | Resp 18 | Ht 68.0 in | Wt 150.0 lb

## 2015-11-03 DIAGNOSIS — F329 Major depressive disorder, single episode, unspecified: Secondary | ICD-10-CM | POA: Diagnosis not present

## 2015-11-03 DIAGNOSIS — R51 Headache: Secondary | ICD-10-CM | POA: Insufficient documentation

## 2015-11-03 DIAGNOSIS — G8929 Other chronic pain: Secondary | ICD-10-CM | POA: Diagnosis not present

## 2015-11-03 DIAGNOSIS — M5412 Radiculopathy, cervical region: Secondary | ICD-10-CM | POA: Insufficient documentation

## 2015-11-03 DIAGNOSIS — E7800 Pure hypercholesterolemia, unspecified: Secondary | ICD-10-CM | POA: Insufficient documentation

## 2015-11-03 DIAGNOSIS — M25559 Pain in unspecified hip: Secondary | ICD-10-CM | POA: Diagnosis present

## 2015-11-03 DIAGNOSIS — F411 Generalized anxiety disorder: Secondary | ICD-10-CM | POA: Diagnosis not present

## 2015-11-03 DIAGNOSIS — M25551 Pain in right hip: Secondary | ICD-10-CM | POA: Diagnosis not present

## 2015-11-03 DIAGNOSIS — M47812 Spondylosis without myelopathy or radiculopathy, cervical region: Secondary | ICD-10-CM | POA: Diagnosis not present

## 2015-11-03 DIAGNOSIS — J449 Chronic obstructive pulmonary disease, unspecified: Secondary | ICD-10-CM | POA: Insufficient documentation

## 2015-11-03 DIAGNOSIS — Z8546 Personal history of malignant neoplasm of prostate: Secondary | ICD-10-CM | POA: Diagnosis not present

## 2015-11-03 DIAGNOSIS — M542 Cervicalgia: Secondary | ICD-10-CM | POA: Insufficient documentation

## 2015-11-03 DIAGNOSIS — Z8673 Personal history of transient ischemic attack (TIA), and cerebral infarction without residual deficits: Secondary | ICD-10-CM | POA: Insufficient documentation

## 2015-11-03 DIAGNOSIS — F1190 Opioid use, unspecified, uncomplicated: Secondary | ICD-10-CM | POA: Insufficient documentation

## 2015-11-03 DIAGNOSIS — M47816 Spondylosis without myelopathy or radiculopathy, lumbar region: Secondary | ICD-10-CM | POA: Diagnosis not present

## 2015-11-03 DIAGNOSIS — R200 Anesthesia of skin: Secondary | ICD-10-CM | POA: Diagnosis not present

## 2015-11-03 DIAGNOSIS — M1611 Unilateral primary osteoarthritis, right hip: Secondary | ICD-10-CM | POA: Diagnosis not present

## 2015-11-03 LAB — TOXASSURE SELECT 13 (MW), URINE: PDF: 0

## 2015-11-03 MED ORDER — METHYLPREDNISOLONE ACETATE 80 MG/ML IJ SUSP
80.0000 mg | Freq: Once | INTRAMUSCULAR | Status: AC
  Administered 2015-11-03: 10:00:00 via INTRA_ARTICULAR

## 2015-11-03 MED ORDER — IOHEXOL 180 MG/ML  SOLN
INTRAMUSCULAR | Status: AC
  Filled 2015-11-03: qty 20

## 2015-11-03 MED ORDER — IOHEXOL 180 MG/ML  SOLN
10.0000 mL | Freq: Once | INTRAMUSCULAR | Status: AC | PRN
  Administered 2015-11-03: 10:00:00 via INTRA_ARTICULAR

## 2015-11-03 MED ORDER — METHYLPREDNISOLONE ACETATE 80 MG/ML IJ SUSP
INTRAMUSCULAR | Status: AC
  Filled 2015-11-03: qty 1

## 2015-11-03 MED ORDER — ROPIVACAINE HCL 2 MG/ML IJ SOLN
4.0000 mL | Freq: Once | INTRAMUSCULAR | Status: AC
  Administered 2015-11-03: 4 mL

## 2015-11-03 MED ORDER — ROPIVACAINE HCL 2 MG/ML IJ SOLN
INTRAMUSCULAR | Status: AC
  Administered 2015-11-03: 4 mL
  Filled 2015-11-03: qty 10

## 2015-11-03 MED ORDER — LIDOCAINE HCL (PF) 1 % IJ SOLN: 10.0000 mL | Freq: Once | INTRAMUSCULAR | Status: DC

## 2015-11-03 NOTE — Progress Notes (Signed)
Patient's Name: Mark Hodges MRN: [id number] DOB: [birth year] DOS: 11/03/2015  Primary Reason(s) for Visit: Interventional Pain Management Treatment. CC: Hip Pain I did  Pre-Procedure Assessment:  Mr. Mark Hodges is a 60 y.o. year old, male patient, seen today for interventional treatment. He has COPD, mild ([facility]); Clinical depression; Difficulty in walking; Compulsive tobacco user syndrome; Hypercholesterolemia without hypertriglyceridemia; Tingling; Has a tremor; Long term current use of opiate analgesic; Long term prescription opiate use; Opiate use; Opiate dependence ([place]); Encounter for therapeutic drug level monitoring; Chronic pain; Chronic low back pain (Location of Secondary source of pain) (Bilateral) (R>L); Chronic lumbar radicular pain (Polyradiculopathy) (Right); Ataxia; HLD (hyperlipidemia); Current tobacco use; Pure hypercholesterolemia; Arm numbness; Substance use disorder Risk: LOW; Chronic upper back pain; Cervical spondylosis; Chronic neck pain; Cervicogenic headache; Chronic radicular cervical pain; Chronic hip pain (Location of Primary Source of Pain) (Right); Generalized anxiety disorder; Depression; At high risk for falls; Erectile dysfunction; History of TIA (transient ischemic attack); 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 His primarily concern today is the Hip Pain   Today's Initial Pain Score: 7/10 Reported level of pain is incompatible with clinical obrservations. This may be secondary to a possible lack of understanding on how the pain scale works. Pain  Type: Chronic pain Pain Location: Hip Pain Orientation: Right Pain Descriptors / Indicators: Stabbing, Shooting Pain Frequency: Intermittent  Post-procedure Pain Score: 0-No pain  Date of Last Visit: 10/28/15 Service Provided on Last Visit: Evaluation  Verification of the correct person, correct site (including marking of site), and correct procedure were performed and confirmed by the patient.  Today's Vitals   11/03/15 1022 11/03/15 1025 11/03/15 1030 11/03/15 1033  BP: 135/78 119/70 130/85 121/65  Pulse: 101 93 104 103  Temp:      Resp: _0 Height:      Weight:      SpO2: 100% 99% 100% 100%  PainSc:    0-No pain  PainLoc:      Calculated BMI: Body mass index is 22.81 kg/(m^2). Allergies: He has No Known Allergies.. Primary Diagnosis: Chronic right hip pain [M25.551, G89.29]  Procedure:  Type: Diagnostic Intra-Articular Hip Injection Region:  Posterolateral hip joint area. Level: Lower pelvic and hip joint level. Laterality: Right  Indications: 1. Chronic hip pain (Location of Primary Source of Pain) (Right)   2. Primary osteoarthritis of right hip     In addition, Mr. 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  Consent: Secured. Under the influence of no sedatives a written informed consent was obtained, after having provided  information on the risks and possible complications. To fulfill our ethical and legal obligations, as recommended  by the American Medical Association's Code of Ethics, we have provided information to the patient about our clinical impression; the nature and purpose of the treatment or procedure; the risks, benefits, and possible complications of the intervention; alternatives; the risk(s) and benefit(s) of the alternative treatment(s) or procedure(s); and the risk(s) and benefit(s) of doing nothing. The patient was provided information about the risks and possible complications associated with the procedure. These include, but are not limited to, failure to achieve desired goals, infection, bleeding, organ or nerve damage, allergic reactions, paralysis, and death. In the case of intra- or periarticular procedures these may include, but are not limited to, failure to achieve desired goals, infection, bleeding (hemarthrosis), organ or nerve damage, allergic reactions, and death. In addition, the patient was informed that Medicine is not an exact science; therefore, there is also the possibility of unforeseen risks and possible complications that may result in a catastrophic outcome. The patient indicated having understood very clearly. We have given the patient no guarantees and we have made no promises. Enough time was given to the patient to ask questions, all of which were answered to the patient's satisfaction.  Pre-Procedure Preparation: Safety Precautions: Allergies reviewed. Appropriate site, procedure, and patient were confirmed by following the Joint Commission's Universal Protocol (UP.01.01.01), in the form of a "Time Out". The patient was asked to confirm marked site and procedure, before commencing. The patient was asked about blood thinners, or active infections, both of which were denied. Patient was assessed for positional comfort and all pressure points were checked before starting  procedure. Monitoring:  As per clinic protocol. Infection Control Precautions: Sterile technique used. Standard Universal Precautions were taken as recommended by the Department of [facility] for Disease Control and Prevention (CDC). Standard pre-surgical skin prep was conducted. Respiratory hygiene and cough etiquette was practiced. Hand hygiene observed. Safe injection practices and needle disposal techniques followed. SDV (single dose vial) medications used. Medications properly checked for expiration dates and contaminants. Personal protective equipment (PPE) used: Radiation resistant gloves.  Anesthesia, Analgesia, Anxiolysis: Type: Local Anesthesia Local Anesthetic: Lidocaine 1% Route: Infiltration ([place]/IM) IV Access: Declined Sedation: Declined  Indication(s): Analgesia    Description of Procedure Process:  Time-out: "Time-out" completed before starting procedure, as per protocol. Position: Right Lateral Decubitus. Target Area: Superior aspect of the hip joint cavity, going thru the superior portion of the capsular ligament. Approach: Lateral approach. Area Prepped: Entire Posterolateral hip area. Prepping solution: ChloraPrep (2% chlorhexidine gluconate and 70% isopropyl alcohol) Safety Precautions: Aspiration looking for blood return was conducted prior to all injections. At no point did we inject any substances, as a needle was being advanced. No attempts were made at seeking any paresthesias. Safe injection practices and needle disposal techniques used. Medications properly checked for expiration dates. SDV (single dose vial) medications used.   Description of the Procedure: Protocol guidelines were followed. The patient was placed in position over the fluoroscopy table. The target area was identified and the area prepped in the usual manner. Skin & deeper tissues infiltrated with local anesthetic. Appropriate amount of time allowed to pass for local anesthetics to take  effect. The procedure needles were then advanced to the target area. Proper needle placement secured. Negative aspiration confirmed. Solution injected in intermittent fashion, asking for systemic symptoms every 0.5cc of injectate. The needles were then removed and the area cleansed, making sure to leave some of the prepping solution back to take advantage of its long term bactericidal properties. EBL: Minimal Materials & Medications  Used:  Needle(s) Used: 22g - 5" Spinal Needle(s) Solution Injected: 0.2% PF-Ropivacaine (31m) + SDV-DepoMedrol 80 mg/ml (132m Medications Administered today: We administered methylPREDNISolone acetate, ropivacaine (PF) 2 mg/ml (0.2%), iohexol, iohexol, ropivacaine (PF) 2 mg/ml (0.2%), and methylPREDNISolone acetate.Please see chart orders for dosing details.  Imaging Guidance:  Type of Imaging Technique: Fluoroscopy Guidance (Non-spinal) Indication(s): Assistance in needle guidance and placement for procedures requiring needle placement in or near specific anatomical locations not easily accessible without such assistance. Exposure Time: Please see nurses notes. Contrast: Before injecting any contrast, we confirmed that the patient did not have an allergy to iodine, shellfish, or radiological contrast. Once satisfactory needle placement was completed at the desired level, radiological contrast was injected. Injection was conducted under continuous fluoroscopic guidance. Injection of contrast accomplished without complications. See chart for type and volume of contrast used. Fluoroscopic Guidance: I was personally present in the fluoroscopy suite, where the patient was placed in position for the procedure, over the fluoroscopy-compatible table. Fluoroscopy was manipulated, using "Tunnel Vision Technique", to obtain the best possible view of the target area, on the affected side. Parallax error was corrected before commencing the procedure. A "direction-depth-direction"  technique was used to introduce the needle under continuous pulsed fluoroscopic guidance. Once the target was reached, antero-posterior, oblique, and lateral fluoroscopic projection views were taken to confirm needle placement in all planes. Permanently recorded images stored by scanning into EMR. Interpretation: Intraoperative imaging interpretation by performing Physician. Adequate needle placement confirmed. Appropriate spread of contrast to desired area. No evidence of afferent or efferent intravascular uptake. Permanent hardcopy images in multiple planes scanned into the patient's record.  Antibiotics:  Type:  Antibiotics Given (last 72 hours)    None      Indication(s): No indications identified or reported.  Post-operative Assessment:  Complications: No immediate post-treatment complications were observed. Disposition: PRN return. The patient will call. The patient was transferred back to his room, once institutional criteria were met. The patient was discharged home, once institutional criteria were met. Return to clinic in 2 weeks for follow-up evaluation and interpretation of results. The patient tolerated the entire procedure well. A repeat set of vitals were taken after the procedure and the patient was kept under observation following institutional policy, for this type of procedure. Post-procedural neurological assessment was performed, showing return to baseline, prior to discharge. The patient was provided with post-procedure discharge instructions, including a section on how to identify potential problems. Should any problems arise concerning this procedure, the patient was given instructions to immediately contact us[place]oreaat any time, without hesitation. In any case, we plan to contact the patient by telephone for a follow-up status report regarding this interventional procedure. Comments:  No additional relevant information.  Primary Care Physician: HATracie HarrierMD [facility] Note by: FrKathlen BrunswickNaDossie ArbourM.D, DABA, DABAPM, DABPM, DABIPP, FIPP  Disclaimer:  Medicine is not an exact science. The only guarantee in medicine is that nothing is guaranteed. It is important to note that the decision to proceed with this intervention was based on the information collected from the patient. The Data and conclusions were drawn from the patient's questionnaire, the interview, and the physical examination. Because the information was provided in large part by the patient, it cannot be guaranteed that it has not been purposely or unconsciously manipulated. Every effort has been made to obtain as much relevant data as possible for this evaluation. It is important to note that the conclusions that lead to this procedure  are derived in large part from the available data. Always take into account that the treatment will also be dependent on availability of resources and existing treatment guidelines, considered by other Pain Management Practitioners as being common knowledge and practice, at the time of the intervention. For Medico-Legal purposes, it is also important to point out that variation in procedural techniques and pharmacological choices are the acceptable norm. The indications, contraindications, technique, and results of the above procedure should only be interpreted and judged by a Board-Certified Interventional Pain Specialist with extensive familiarity and expertise in the same exact procedure and technique. Attempts at providing opinions without similar or greater experience and expertise than that of the treating physician will be considered as inappropriate and unethical, and shall result in a formal complaint to the state medical board and applicable specialty societies.

## 2015-11-03 NOTE — Patient Instructions (Signed)
Sacroiliac (SI) Joint Injection Patient Information  Description: The sacroiliac joint connects the scrum (very low back and tailbone) to the ilium (a pelvic bone which also forms half of the hip joint).  Normally this joint experiences very little motion.  When this joint becomes inflamed or unstable low back and or hip and pelvis pain may result.  Injection of this joint with local anesthetics (numbing medicines) and steroids can provide diagnostic information and reduce pain.  This injection is performed with the aid of x-ray guidance into the tailbone area while you are lying on your stomach.   You may experience an electrical sensation down the leg while this is being done.  You may also experience numbness.  We also may ask if we are reproducing your normal pain during the injection.  Conditions which may be treated SI injection:   Low back, buttock, hip or leg pain  Preparation for the Injection:  1. Do not eat any solid food or dairy products within 6 hours of your appointment.  2. You may drink clear liquids up to 2 hours before appointment.  Clear liquids include water, black coffee, juice or soda.  No milk or cream please. 3. You may take your regular medications, including pain medications with a sip of water before your appointment.  Diabetics should hold regular insulin (if take separately) and take 1/2 normal NPH dose the morning of the procedure.  Carry some sugar containing items with you to your appointment. 4. A driver must accompany you and be prepared to drive you home after your procedure. 5. Bring all of your current medications with you. 6. An IV may be inserted and sedation may be given at the discretion of the physician. 7. A blood pressure cuff, EKG and other monitors will often be applied during the procedure.  Some patients may need to have extra oxygen administered for a short period.  8. You will be asked to provide medical information, including your allergies,  prior to the procedure.  We must know immediately if you are taking blood thinners (like Coumadin/Warfarin) or if you are allergic to IV iodine contrast (dye).  We must know if you could possible be pregnant.  Possible side effects:   Bleeding from needle site  Infection (rare, may require surgery)  Nerve injury (rare)  Numbness & tingling (temporary)  A brief convulsion or seizure  Light-headedness (temporary)  Pain at injection site (several days)  Decreased blood pressure (temporary)  Weakness in the leg (temporary)   Call if you experience:   New onset weakness or numbness of an extremity below the injection site that last more than 8 hours.  Hives or difficulty breathing ( go to the emergency room)  Inflammation or drainage at the injection site  Any new symptoms which are concerning to you  Please note:  Although the local anesthetic injected can often make your back/ hip/ buttock/ leg feel good for several hours after the injections, the pain will likely return.  It takes 3-7 days for steroids to work in the sacroiliac area.  You may not notice any pain relief for at least that one week.  If effective, we will often do a series of three injections spaced 3-6 weeks apart to maximally decrease your pain.  After the initial series, we generally will wait some months before a repeat injection of the same type.  If you have any questions, please call [phone] [facility]  Pain Management Discharge   Instructions  General Discharge Instructions :  If you need to reach your doctor call: Monday-Friday 8:00 am - 4:00 pm at [phone] or toll free [phone].  After clinic hours [phone] to have operator reach doctor.  Bring all of your medication bottles to all your appointments in the pain clinic.  To cancel or reschedule your appointment with Pain Management please remember to call 24 hours in advance to avoid a  fee.  Refer to the educational materials which you have been given on: General Risks, I had my Procedure. Discharge Instructions, Post Sedation.  Post Procedure Instructions:  The drugs you were given will stay in your system until tomorrow, so for the next 24 hours you should not drive, make any legal decisions or drink any alcoholic beverages.  You may eat anything you prefer, but it is better to start with liquids then soups and crackers, and gradually work up to solid foods.  Please notify your doctor immediately if you have any unusual bleeding, trouble breathing or pain that is not related to your normal pain.  Depending on the type of procedure that was done, some parts of your body may feel week and/or numb.  This usually clears up by tonight or the next day.  Walk with the use of an assistive device or accompanied by an adult for the 24 hours.  You may use ice on the affected area for the first 24 hours.  Put ice in a Ziploc bag and cover with a towel and place against area 15 minutes on 15 minutes off.  You may switch to heat after 24 hours. 

## 2015-11-03 NOTE — Progress Notes (Signed)
Safety precautions to be maintained throughout the outpatient stay will include: orient to surroundings, keep bed in low position, maintain call bell within reach at all times, provide assistance with transfer out of bed and ambulation.  

## 2015-11-04 ENCOUNTER — Telehealth: Payer: Self-pay | Admitting: *Deleted

## 2015-11-04 NOTE — Telephone Encounter (Signed)
Verbalizes no complications from procedure on yesterday.

## 2015-11-07 NOTE — Progress Notes (Signed)
Quick Note:  The findings of this UDT were reported as abnormal due to inconsistencies with expected results. An expected substance was not present in the sample. Expectations were based on the medication history provided by the patient at the time of sample collection. These results are concerning due to the possibility of opioid diversion, or non-compliance with instructions on how to take the medication, including increase intake of medication early in the prescription refill, possibly running out towards the end of the prescribed period. ______ 

## 2015-11-19 ENCOUNTER — Encounter: Payer: Self-pay | Admitting: Pain Medicine

## 2015-11-19 ENCOUNTER — Ambulatory Visit: Payer: BLUE CROSS/BLUE SHIELD | Attending: Pain Medicine | Admitting: Pain Medicine

## 2015-11-19 VITALS — BP 127/68 | HR 98 | Temp 98.0°F | Resp 16 | Ht 68.0 in | Wt 155.0 lb

## 2015-11-19 DIAGNOSIS — E785 Hyperlipidemia, unspecified: Secondary | ICD-10-CM | POA: Diagnosis not present

## 2015-11-19 DIAGNOSIS — M545 Low back pain: Secondary | ICD-10-CM | POA: Diagnosis not present

## 2015-11-19 DIAGNOSIS — R937 Abnormal findings on diagnostic imaging of other parts of musculoskeletal system: Secondary | ICD-10-CM | POA: Insufficient documentation

## 2015-11-19 DIAGNOSIS — G950 Syringomyelia and syringobulbia: Secondary | ICD-10-CM

## 2015-11-19 DIAGNOSIS — Z8673 Personal history of transient ischemic attack (TIA), and cerebral infarction without residual deficits: Secondary | ICD-10-CM | POA: Diagnosis not present

## 2015-11-19 DIAGNOSIS — M5136 Other intervertebral disc degeneration, lumbar region: Secondary | ICD-10-CM | POA: Diagnosis not present

## 2015-11-19 DIAGNOSIS — F329 Major depressive disorder, single episode, unspecified: Secondary | ICD-10-CM | POA: Insufficient documentation

## 2015-11-19 DIAGNOSIS — M5124 Other intervertebral disc displacement, thoracic region: Secondary | ICD-10-CM | POA: Insufficient documentation

## 2015-11-19 DIAGNOSIS — J449 Chronic obstructive pulmonary disease, unspecified: Secondary | ICD-10-CM | POA: Insufficient documentation

## 2015-11-19 DIAGNOSIS — G8929 Other chronic pain: Secondary | ICD-10-CM

## 2015-11-19 DIAGNOSIS — Z87891 Personal history of nicotine dependence: Secondary | ICD-10-CM | POA: Insufficient documentation

## 2015-11-19 DIAGNOSIS — M4806 Spinal stenosis, lumbar region: Secondary | ICD-10-CM | POA: Diagnosis not present

## 2015-11-19 DIAGNOSIS — E7800 Pure hypercholesterolemia, unspecified: Secondary | ICD-10-CM | POA: Insufficient documentation

## 2015-11-19 DIAGNOSIS — M5126 Other intervertebral disc displacement, lumbar region: Secondary | ICD-10-CM | POA: Insufficient documentation

## 2015-11-19 DIAGNOSIS — M25559 Pain in unspecified hip: Secondary | ICD-10-CM | POA: Diagnosis present

## 2015-11-19 DIAGNOSIS — M25551 Pain in right hip: Secondary | ICD-10-CM | POA: Diagnosis not present

## 2015-11-19 DIAGNOSIS — R270 Ataxia, unspecified: Secondary | ICD-10-CM | POA: Diagnosis not present

## 2015-11-19 DIAGNOSIS — M25569 Pain in unspecified knee: Secondary | ICD-10-CM | POA: Diagnosis present

## 2015-11-19 DIAGNOSIS — R51 Headache: Secondary | ICD-10-CM | POA: Diagnosis not present

## 2015-11-19 DIAGNOSIS — M792 Neuralgia and neuritis, unspecified: Secondary | ICD-10-CM

## 2015-11-19 DIAGNOSIS — N400 Enlarged prostate without lower urinary tract symptoms: Secondary | ICD-10-CM | POA: Insufficient documentation

## 2015-11-19 DIAGNOSIS — Z8546 Personal history of malignant neoplasm of prostate: Secondary | ICD-10-CM | POA: Diagnosis not present

## 2015-11-19 DIAGNOSIS — N529 Male erectile dysfunction, unspecified: Secondary | ICD-10-CM | POA: Diagnosis not present

## 2015-11-19 DIAGNOSIS — M542 Cervicalgia: Secondary | ICD-10-CM | POA: Diagnosis not present

## 2015-11-19 DIAGNOSIS — M48061 Spinal stenosis, lumbar region without neurogenic claudication: Secondary | ICD-10-CM

## 2015-11-19 LAB — COMPREHENSIVE METABOLIC PANEL
ALT: 24 U/L (ref 17–63)
ANION GAP: 10 (ref 5–15)
AST: 25 U/L (ref 15–41)
Albumin: 4.3 g/dL (ref 3.5–5.0)
Alkaline Phosphatase: 92 U/L (ref 38–126)
BUN: 5 mg/dL — ABNORMAL LOW (ref 6–20)
CHLORIDE: 104 mmol/L (ref 101–111)
CO2: 26 mmol/L (ref 22–32)
CREATININE: 0.73 mg/dL (ref 0.61–1.24)
Calcium: 9.7 mg/dL (ref 8.9–10.3)
Glucose, Bld: 95 mg/dL (ref 65–99)
POTASSIUM: 3.9 mmol/L (ref 3.5–5.1)
SODIUM: 140 mmol/L (ref 135–145)
Total Bilirubin: 0.4 mg/dL (ref 0.3–1.2)
Total Protein: 8.1 g/dL (ref 6.5–8.1)

## 2015-11-19 LAB — MAGNESIUM: MAGNESIUM: 2 mg/dL (ref 1.7–2.4)

## 2015-11-19 LAB — SEDIMENTATION RATE: SED RATE: 5 mm/h (ref 0–20)

## 2015-11-19 LAB — C-REACTIVE PROTEIN: CRP: 1.4 mg/dL — ABNORMAL HIGH (ref <1.0)

## 2015-11-19 MED ORDER — OXYCODONE-ACETAMINOPHEN 10-325 MG PO TABS: 1.0000 | ORAL_TABLET | Freq: Four times a day (QID) | ORAL | Status: DC | PRN

## 2015-11-19 MED ORDER — GABAPENTIN 300 MG PO CAPS: 900.0000 mg | ORAL_CAPSULE | Freq: Three times a day (TID) | ORAL | Status: DC

## 2015-11-19 NOTE — Progress Notes (Signed)
Patient states that he ran across some oxycodone that he had put aside and took one last night.

## 2015-11-19 NOTE — Assessment & Plan Note (Signed)
MRI THORACIC SPINE WITHOUT AND WITH CONTRAST  TECHNIQUE: Multiplanar and multiecho pulse sequences of the thoracic spine were obtained without and with intravenous contrast.  CONTRAST: 35mL MULTIHANCE GADOBENATE DIMEGLUMINE 529 MG/ML IV SOLN  COMPARISON: None.  FINDINGS: Normal signal is present in the thoracic spinal cord to the lowest imaged level, L1. A small syrinx is evident from T7-8 through T9-10. This is slightly less prominent than on the prior study. There is no associated mass lesion or enhancement.  Slight disc bulging at T11-12 is stable. There is no new focal disc protrusion. Dilated perineural root sleeve cysts at T5-6, T6-7, T7-8, particularly at T8-9 will or will are again noted without significant interval change.  The paraspinous soft tissues are within normal limits.  Mild left foraminal narrowing is present at T2-3 secondary to facet disease. The foramina are otherwise widely patent bilaterally.  Postcontrast images demonstrate no pathologic enhancement.  IMPRESSION: 1. No significant interval change in mild spondylosis of the cervical spine thoracic spine since August 2015. 2. Stable to slight decreased conspicuity of small syrinx from T7-8 through T9-10 without associated mass lesion or cord expansion. 3. Mild left foraminal narrowing at T2-3 is stable.

## 2015-11-19 NOTE — Assessment & Plan Note (Signed)
MRI LUMBAR SPINE WITHOUT AND WITH CONTRAST  TECHNIQUE: Multiplanar and multiecho pulse sequences of the lumbar spine were obtained without and with intravenous contrast.  CONTRAST: 42mL MULTIHANCE GADOBENATE DIMEGLUMINE 529 MG/ML IV SOLN  COMPARISON: 05/03/2014  FINDINGS: Normal alignment of the lumbar vertebral bodies. They demonstrate normal marrow signal. The conus medullaris terminates at T12-L1. Small remnant disc space at S1-2 is noted. The facets are normally aligned. No pars defects. No significant paraspinal or retroperitoneal abnormalities.  T11-12: Bulging annulus with mild impression on the ventral thecal sac.  T12-L1: Very small central disc extrusion with disc material coursing up behind the T12 vertebral body. Minimal impression on the ventral thecal sac.  L1-2: No significant findings.  L2-3: No significant findings.  L3-4: Shallow central disc protrusion with mild impression on the ventral thecal sac. Mild diffuse annular bulge with mild lateral recess encroachment and mild bilateral foraminal encroachment, right greater than left. Potential irritation of the right L3 nerve root. This appears relatively stable.  L4-5: Mild annular bulge and mild facet disease but no focal disc protrusions, spinal or foraminal stenosis.  L5-S1: No disc protrusions, spinal or foraminal stenosis. Stable moderate facet disease.  IMPRESSION: 1. Stable small central disc extrusion at T12-L1. 2. Stable shallow central disc protrusion at L3-4 along with a diffuse bulging annulus with mild bilateral lateral recess encroachment and bilateral extra foraminal encroachment on the L3 nerve roots, right greater than left.

## 2015-11-19 NOTE — Progress Notes (Signed)
Patient's Name: Mark Hodges MRN: [id number] DOB: [birth year] DOS: 11/19/2015  Primary Reason(s) for Visit: Post-Procedure evaluation CC: Hip Pain and Knee Pain   HPI  Mark Hodges is a 60 y.o. year old, male patient, who returns today as an established patient. He has COPD, mild ([place]); Clinical depression; Difficulty in walking; Compulsive tobacco user syndrome; Hypercholesterolemia without hypertriglyceridemia; Tingling; Has a tremor; Long term current use of opiate analgesic; Long term prescription opiate use; Opiate use; Opiate dependence ([place]); Encounter for therapeutic drug level monitoring; Chronic pain; Chronic low back pain (Location of Secondary source of pain) (Bilateral) (R>L); Chronic lumbar radicular pain (Polyradiculopathy) (Right); Ataxia; HLD (hyperlipidemia); Current tobacco use; Pure hypercholesterolemia; Arm numbness; Substance use disorder Risk: LOW; Chronic upper back pain; Cervical spondylosis; Chronic neck pain; Cervicogenic headache; Chronic radicular cervical pain; Chronic hip pain (Location of Primary Source of Pain) (Right); Generalized anxiety disorder; Depression; At high risk for falls; Erectile dysfunction; History of TIA (transient ischemic attack); 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 His primarily concern today is the Hip Pain and Knee Pain   The patient returns to the clinics today for postprocedure evaluation after having had an intra-articular hip injection on the right side using  fluoroscopic guidance and contrast to confirm placement. The patient indicates a 75% relief of the pain for the duration of local anesthetic indicating that at least 75% of his hip pain is coming from the hip joint itself and not from the lumbar spine. Today we will order an MRI of the hip to further evaluate for the etiology of the pain.  The patient returns to the clinics today and indicates that he would like to go back to the Percocet 10 that he was taking before. I reminded him that the reason why I stopped it was because he said that it was not doing anything and it was like taking water. He now admits that this was an exaggeration on his part and when he was trying to communicate was that he felt he needed more pain medicine. Today I made it very clear that 40 mg of oxycodone per day is sufficient to control her somatic pain on an individual with a BMI of 23. The other point that I cord was the fact that his last UDS done on 10/28/2015 had come back with no oxycodone. I informed him of my concern with her as to this and he understood.  Reported Pain Score: 7 , clinically he looks like a 4-5/10. Reported level is inconsistent with clinical obrservations. Pain Type: Chronic pain Pain Location: Hip Pain Orientation: Right Pain Descriptors / Indicators: Stabbing, Shooting Pain Frequency: Constant  Date of Last Visit: 11/03/15 Service Provided on Last Visit: Procedure (right hip injection)  Controlled Substance Pharmacotherapy Assessment  Analgesic: Previously he was taking oxycodone/APAP 10/325 one tablet by mouth every 6 hours (40 mg/day). MME/day: 60 mg/day Pharmacokinetics: Onset of action (Liberation/Absorption): Within expected pharmacological parameters Time to Peak effect (Distribution): Timing and results are as within normal expected parameters Duration of action (Metabolism/Excretion): Within normal limits for medication Pharmacodynamics: Analgesic Effect: Pending further  evaluation on his next visit. Activity  Facilitation: Pending further evaluation on his next visit. Perceived Effectiveness: Pending evaluation on his next visit. However, he indicates that he did not have any. Side-effects or Adverse reactions: None reported Monitoring: [place] PMP: Pending Evaluation UDS Results/interpretation: The patient's last UDS done on 10/28/2015 came back with no oxycodone in the system which was considered to be unexpected. Medication Assessment Form: Reviewed. Patient indicates being compliant with therapy Treatment compliance: Compliant Risk Assessment: Substance Use Disorder (SUD) Risk Level: High Opioid Risk Tool (ORT) Score:      Depression Scale Score:    Pharmacologic Plan: We will start him back on the same medication that he had before but we will keep a close eye on him.  Lab Work: Illicit Drugs No results found for: THCU, COCAINSCRNUR, PCPSCRNUR, MDMA, AMPHETMU, METHADONE, ETOH  Inflammation Markers No results found for: ESRSEDRATE, CRP  Renal Function No results found for: BUN, CREATININE, GFRAA, GFRNONAA  Hepatic Function No results found for: AST, ALT, ALBUMIN  Electrolytes No results found for: NA, K, CL, CALCIUM, MG  Post-Procedure Assessment  Procedure done on last visit: Right intra-articular hip injection under fluoroscopic guidance and no sedation. Side-effects or Adverse reactions: None reported Sedation: No sedation used during procedure  Results: Ultra-Short Term Relief (First 1 hour after procedure): 75 % (in the hip)  Possibly the results is influenced by the pharmacodynamic effect of the local anesthetic, as well as that of the intravenous analgesics and/or sedatives, when used Short Term Relief (Initial 4-6 hrs after procedure): 75 % Short-term relief confirms injected site to be the source of pain Long Term Relief : 75 % (3 days then it came back) Long-term benefit would suggest an inflammatory etiology to the pain   Current  Relief (Now):  25%  Recurrance of pain could suggest persistent aggravating factors Interpretation of Results: Based on the results of this test, I would interpreted as indicating that 75% of the pain is coming from the hip joint. Here I'm referring to the pain in the right hip area. Based on this diagnostic injection, only 25% of the pain remains unaccounted for. Having reviewed his lumbar and thoracic MRI, it is very likely that it may be originating in that area.   Allergies  Mr. Wittman has No Known Allergies.  Meds  The patient has a current medication list which includes the following prescription(s): aspirin, atorvastatin, gabapentin, venlafaxine hcl, albuterol, nortriptyline, and oxycodone-acetaminophen.  Current Outpatient Prescriptions on File Prior to Visit  Medication Sig  . aspirin 325 MG tablet Take by mouth.  Marland Kitchen atorvastatin (LIPITOR) 10 MG tablet Take 10 mg by mouth daily.   . Venlafaxine HCl 225 MG TB24 Take 2 tablets by mouth daily.   Marland Kitchen albuterol (PROAIR HFA) 108 (90 BASE) MCG/ACT inhaler Inhale into the lungs.  . nortriptyline (PAMELOR) 10 MG capsule Take 10 mg by mouth 3 (three) times daily.    No current facility-administered medications on file prior to visit.    ROS  Constitutional: Afebrile, no chills, well hydrated and well nourished Gastrointestinal: negative Musculoskeletal:negative Neurological: negative Behavioral/Psych: negative  PFSH  Medical:  Mr. Limbacher  has a past medical history of Prostate cancer ([employer]); BPH (benign prostatic hyperplasia); Erectile dysfunction; Premature ejaculation; Hypogonadism in male; Hyperlipidemia; Lower urinary tract infection; TIA (transient ischemic attack); Stroke [employer]); Arthritis; and Depression. Family: family history includes Heart disease in his father. Surgical:  has past surgical history that includes Tonsillectomy. Tobacco:  reports that he has quit smoking. His smokeless tobacco use includes Chew. Alcohol:  reports  that he does not drink alcohol. Drug:  reports that he does not use illicit drugs.  Physical Exam  Vitals:  Today's Vitals   11/19/15 1139 11/19/15 1140  BP: 127/68   Pulse: 98   Temp: 98 F (36.7 C)   Resp: 16   Height: 5\' 8"  (1.727 m)   Weight: 155 lb (70.308 kg)   SpO2: 100%   PainSc: 7  7   PainLoc: Hip     Calculated BMI: Body mass index is 23.57 kg/(m^2).  General appearance: alert, cooperative, appears stated age and mild distress Eyes: PERLA Respiratory: No evidence respiratory distress, no audible rales or ronchi and no use of accessory muscles of respiration  Cervical Spine Inspection: Normal anatomy Alignment: Symetrical ROM: Adequate  Upper Extremities Inspection: No gross anomalies detected ROM: Adequate Sensory: Normal Motor: Unremarkable  Thoracic Spine Inspection: No gross anomalies detected Alignment: Symetrical ROM: Adequate  Lumbar Spine Inspection: No gross anomalies detected Alignment: Symetrical ROM: Decreased  Gait: Ataxia. He is using a walker to ambulate.  Lower Extremities Inspection: No gross anomalies detected ROM: Decreased for his right hip. Sensory:  Normal Motor: Weakness on the left lower extremity and guarding on the right lower extremity.  Assessment & Plan  Primary Diagnosis & Pertinent Problem List: The primary encounter diagnosis was Chronic hip pain (Location of Primary Source of Pain) (Right). 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  Visit Diagnosis: 1. Chronic hip pain (Location of Primary Source of Pain) (Right)   2. 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 Chronic pain   4. Ataxia   5. Abnormal MRI, thoracic spine   6.  Abnormal MRI, lumbar spine (06/26/2015)   7. 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   8. Neuropathic pain     Problem-specific Plan(s): Abnormal MRI, lumbar spine (06/26/2015) MRI LUMBAR SPINE WITHOUT AND WITH CONTRAST  TECHNIQUE: Multiplanar and multiecho pulse sequences of the lumbar spine were obtained without and with intravenous contrast.  CONTRAST: 73mL MULTIHANCE GADOBENATE DIMEGLUMINE 529 MG/ML IV SOLN  COMPARISON: 05/03/2014  FINDINGS: Normal alignment of the lumbar vertebral bodies. They demonstrate normal marrow signal. The conus medullaris terminates at T12-L1. Small remnant disc space at S1-2 is noted. The facets are normally aligned. No pars defects. No significant paraspinal or retroperitoneal abnormalities.  T11-12: Bulging annulus with mild impression on the ventral thecal sac.  T12-L1: Very small central disc extrusion with disc material coursing up behind the T12 vertebral body. Minimal impression on the ventral thecal sac.  L1-2: No significant findings.  L2-3: No significant findings.  L3-4: Shallow central disc protrusion with mild impression on the ventral thecal sac. Mild diffuse annular bulge with mild lateral recess encroachment and mild bilateral foraminal encroachment, right greater than left. Potential irritation of the right L3 nerve root. This appears relatively stable.  L4-5: Mild annular bulge and mild facet disease but no focal disc protrusions, spinal or foraminal stenosis.  L5-S1: No disc protrusions, spinal or foraminal stenosis. Stable moderate facet disease.  IMPRESSION: 1. Stable small central disc extrusion at T12-L1. 2. Stable shallow central disc protrusion at L3-4 along with a diffuse bulging annulus with mild bilateral lateral recess encroachment and bilateral extra foraminal encroachment  on the L3 nerve roots, right greater than  left.  Abnormal MRI, thoracic spine (07/23/2015) MRI THORACIC SPINE WITHOUT AND WITH CONTRAST  TECHNIQUE: Multiplanar and multiecho pulse sequences of the thoracic spine were obtained without and with intravenous contrast.  CONTRAST: 57mL MULTIHANCE GADOBENATE DIMEGLUMINE 529 MG/ML IV SOLN  COMPARISON: None.  FINDINGS: Normal signal is present in the thoracic spinal cord to the lowest imaged level, L1. A small syrinx is evident from T7-8 through T9-10. This is slightly less prominent than on the prior study. There is no associated mass lesion or enhancement.  Slight disc bulging at T11-12 is stable. There is no new focal disc protrusion. Dilated perineural root sleeve cysts at T5-6, T6-7, T7-8, particularly at T8-9 will or will are again noted without significant interval change.  The paraspinous soft tissues are within normal limits.  Mild left foraminal narrowing is present at T2-3 secondary to facet disease. The foramina are otherwise widely patent bilaterally.  Postcontrast images demonstrate no pathologic enhancement.  IMPRESSION: 1. No significant interval change in mild spondylosis of the cervical spine thoracic spine since August 2015. 2. Stable to slight decreased conspicuity of small syrinx from T7-8 through T9-10 without associated mass lesion or cord expansion. 3. Mild left foraminal narrowing at T2-3 is stable.     Plan of Care  Pharmacotherapy (Medications Ordered): Meds ordered this encounter  Medications  . gabapentin (NEURONTIN) 300 MG capsule    Sig: Take 3 capsules (900 mg total) by mouth 3 (three) times daily. Follow titration schedule.    Dispense:  810 capsule    Refill:  0    This is a 90 day refill to be mailed out.  Marland Kitchen oxyCODONE-acetaminophen (PERCOCET) 10-325 MG tablet    Sig: Take 1 tablet by mouth every 6 (six) hours as needed for pain.    Dispense:  120 tablet    Refill:  0    Do not place this medication, or any other  prescription from our practice, on "Automatic Refill". Patient may have prescription filled one day early if pharmacy is closed on scheduled refill date. Do not fill until: 11/19/15 To last until: 12/19/15    [facility] & Procedure Ordered: Orders Placed This Encounter  Procedures  . MR Hip Right Wo Contrast    Standing Status: Future     Number of Occurrences:      Standing Expiration Date: 11/18/2016    Order Specific Question:  Reason for Exam (SYMPTOM  OR DIAGNOSIS REQUIRED)    Answer:  Right hip pain/arthralgia    Order Specific Question:  Preferred imaging location?    Answer:  [facility]    Order Specific Question:  Does the patient have a pacemaker or implanted devices?    Answer:  No    Order Specific Question:  What is the patient's sedation requirement?    Answer:  No Sedation    Order Specific Question:  Call Results- Best Contact Number?    Answer:  [id number] (Pain Clinic facility) (Dr. Dossie Arbour)  . Comprehensive metabolic panel    Order Specific Question:  Has the patient fasted?    Answer:  No  . C-reactive protein  . Magnesium  . Sedimentation rate  . Vitamin B12    Indication: Bone Pain (M89.9)  . Vitamin D pnl(25-hydrxy+1,25-dihy)-bld  . Ambulatory referral to Orthopedic Surgery    Referral Priority:  Routine    Referral Type:  Surgical    Referral Reason:  Specialty Services Required    Requested  Specialty:  Orthopedic Surgery    Number of Visits Requested:  1  . Ambulatory referral to Neurosurgery    Referral Priority:  Routine    Referral Type:  Surgical    Referral Reason:  Specialty Services Required    Requested Specialty:  Neurosurgery    Number of Visits Requested:  1    Imaging Ordered: AMB REFERRAL TO ORTHOPEDIC SURGERY AMB REFERRAL TO NEUROSURGERY MR HIP RIGHT WO CONTRAST  Interventional Therapies: Scheduled: None at this time. PRN Procedures: None at this time.    Referral(s) or Consult(s): We will be ordering an MRI of the  right hip followed by an orthopedic consult to see if there is anything that they can do to assist him with this pain in the right hip. In addition to this, we will be sending him back to have a neurosurgical consult to determine if there is anything that they can do for all the problems that we have detected that he has in the thoracic and lumbar region.  Medications administered during this visit: Mr. Mignano had no medications administered during this visit.  Future Appointments Date Time Provider [place]  11/24/2015 10:15 AM [facility]  12/04/2015 10:30 AM Hollice Espy, MD [facility] None  12/16/2015 10:40 AM Milinda Pointer, MD [facility] None    Primary Care Physician: Tracie Harrier, MD Location: [facility] Note by: Kathlen Brunswick. Dossie Arbour, M.D, DABA, DABAPM, DABPM, DABIPP, FIPP

## 2015-11-19 NOTE — Patient Instructions (Signed)
Instructed to get labwork drawn at Pre admit testing today.

## 2015-11-19 NOTE — Progress Notes (Signed)
Safety precautions to be maintained throughout the outpatient stay will include: orient to surroundings, keep bed in low position, maintain call bell within reach at all times, provide assistance with transfer out of bed and ambulation.  

## 2015-11-24 ENCOUNTER — Ambulatory Visit
Admission: RE | Admit: 2015-11-24 | Discharge: 2015-11-24 | Disposition: A | Discharge status: Home / Self Care | Payer: BLUE CROSS/BLUE SHIELD | Source: Ambulatory Visit | Attending: Neurology | Admitting: Neurology

## 2015-11-24 DIAGNOSIS — M5441 Lumbago with sciatica, right side: Secondary | ICD-10-CM | POA: Diagnosis present

## 2015-11-24 DIAGNOSIS — R202 Paresthesia of skin: Secondary | ICD-10-CM | POA: Insufficient documentation

## 2015-11-24 DIAGNOSIS — R519 Headache, unspecified: Secondary | ICD-10-CM

## 2015-11-24 DIAGNOSIS — R262 Difficulty in walking, not elsewhere classified: Secondary | ICD-10-CM | POA: Insufficient documentation

## 2015-11-24 DIAGNOSIS — R51 Headache: Secondary | ICD-10-CM

## 2015-11-24 DIAGNOSIS — G8929 Other chronic pain: Secondary | ICD-10-CM | POA: Diagnosis not present

## 2015-11-24 DIAGNOSIS — R9089 Other abnormal findings on diagnostic imaging of central nervous system: Secondary | ICD-10-CM | POA: Diagnosis not present

## 2015-11-24 DIAGNOSIS — R251 Tremor, unspecified: Secondary | ICD-10-CM | POA: Insufficient documentation

## 2015-11-24 DIAGNOSIS — M5416 Radiculopathy, lumbar region: Secondary | ICD-10-CM | POA: Diagnosis not present

## 2015-11-24 NOTE — Progress Notes (Signed)
Quick Note:   Low BUN levels are not common and are not usually a cause for concern. They may be seen in severe liver disease, malnutrition, and sometimes when a person is overhydrated (too much fluid volume), but the BUN test is not usually used to diagnose or monitor these conditions.  ______

## 2015-11-24 NOTE — Progress Notes (Signed)
Quick Note:  Lab results reviewed and found to be within normal limits. ______ 

## 2015-11-24 NOTE — Progress Notes (Signed)

## 2015-12-04 ENCOUNTER — Encounter: Payer: Self-pay | Admitting: Urology

## 2015-12-04 ENCOUNTER — Ambulatory Visit (INDEPENDENT_AMBULATORY_CARE_PROVIDER_SITE_OTHER): Payer: BLUE CROSS/BLUE SHIELD | Admitting: Urology

## 2015-12-04 VITALS — BP 144/77 | HR 48 | Ht 68.0 in | Wt 140.0 lb

## 2015-12-04 DIAGNOSIS — N528 Other male erectile dysfunction: Secondary | ICD-10-CM

## 2015-12-04 DIAGNOSIS — C61 Malignant neoplasm of prostate: Secondary | ICD-10-CM | POA: Diagnosis not present

## 2015-12-04 DIAGNOSIS — R350 Frequency of micturition: Secondary | ICD-10-CM

## 2015-12-04 DIAGNOSIS — K625 Hemorrhage of anus and rectum: Secondary | ICD-10-CM

## 2015-12-04 LAB — URINALYSIS, COMPLETE
BILIRUBIN UA: NEGATIVE
Glucose, UA: NEGATIVE
KETONES UA: NEGATIVE
LEUKOCYTES UA: NEGATIVE
Nitrite, UA: NEGATIVE
PROTEIN UA: NEGATIVE
RBC UA: NEGATIVE
SPEC GRAV UA: 1.005 (ref 1.005–1.030)
UUROB: 0.2 mg/dL (ref 0.2–1.0)
pH, UA: 6 (ref 5.0–7.5)

## 2015-12-04 LAB — BLADDER SCAN AMB NON-IMAGING

## 2015-12-04 LAB — MICROSCOPIC EXAMINATION
BACTERIA UA: NONE SEEN
Epithelial Cells (non renal): NONE SEEN /hpf (ref 0–10)
WBC UA: NONE SEEN /HPF (ref 0–?)

## 2015-12-04 MED ORDER — ALPROSTADIL (VASODILATOR) 10 MCG IC KIT: 10.0000 ug | PACK | INTRACAVERNOUS | Status: DC | PRN

## 2015-12-04 MED ORDER — TAMSULOSIN HCL 0.4 MG PO CAPS: 0.4000 mg | ORAL_CAPSULE | Freq: Every day | ORAL | Status: DC

## 2015-12-04 NOTE — Progress Notes (Signed)
11:09 AM  12/06/2015   Mark Hodges 27-May-1956 [id number]  Referring provider: Tracie Harrier, MD [address] [facility] [place], [place] [address]  Chief Complaint  Patient presents with  . Prostate Cancer    56month with PSA &DRE    HPI:  Prostate cancer  60 yo M with diagnosed in 01/2014 with T1c Gleason 3+3 prostate cancer in 2/12 cores, 1-3% and single core HGPIN dx 01/2014. PSA at time of dx 5.41. TRUS vol 34 cc. He has been on active surveillance.    Repeat biopsy on 02/20/15 per active surveillance protocol which showed only a small focus of atypical glands, suspicious for carcinoma   TRUS vol 33 cc.     His PSA has been relatively stable in the 5-7 range.  Most recent PSA 6.8 on 08/06/15.    He returns today for PSA/ DRE.  LUTS Also complains of LUTS, urinary frequency. Not taking any prostate meds. He was previously prescribed Flomax back in 9/ 2015 but never took this medication.  He does drink tea on a regular basis throughout the day.  PVR minimal.    ED Baseline ED, failed multiple PDE5i. No sucess with VED.  Currently good success with caverject 10 mcg.  Rectal Bleeding He complain today of rectal bleeding in the shower a few weeks ago.  His primary care is Dr. Ginette Pitman.  He has not had a colonscopy.    PHx chronic back pain managed by pain clinic      IPSS      12/04/15 1000       International Prostate Symptom Score   How often have you had the sensation of not emptying your bladder? Almost always     How often have you had to urinate less than every two hours? Almost always     How often have you found you stopped and started again several times when you urinated? Almost always     How often have you found it difficult to postpone urination? More than half the time     How often have you had a weak urinary stream? Almost always     How often have you had to strain to start urination? Almost always     How many times did  you typically get up at night to urinate? 3 Times     Total IPSS Score 32     Quality of Life due to urinary symptoms   If you were to spend the rest of your life with your urinary condition just the way it is now how would you feel about that? Terrible        Score:  1-7 Mild 8-19 Moderate 20-35 Severe   PMH: Past Medical History  Diagnosis Date  . Prostate cancer ([place])   . BPH (benign prostatic hyperplasia)   . Erectile dysfunction   . Premature ejaculation   . Hypogonadism in male   . Hyperlipidemia   . Lower urinary tract infection   . TIA (transient ischemic attack)   . Stroke ([place])   . Arthritis   . Depression     Surgical History: Past Surgical History  Procedure Laterality Date  . Tonsillectomy      Home Medications:    Medication List       This list is accurate as of: 12/04/15 11:59 PM.  Always use your most recent med list.  alprostadil 10 MCG injection  Commonly known as:  EDEX  10 mcg by Intracavitary route as needed for erectile dysfunction. use no more than 3 times per week     aspirin 325 MG tablet  Take by mouth.     atorvastatin 10 MG tablet  Commonly known as:  LIPITOR  Take 10 mg by mouth daily.     gabapentin 300 MG capsule  Commonly known as:  NEURONTIN  Take 3 capsules (900 mg total) by mouth 3 (three) times daily. Follow titration schedule.     nortriptyline 10 MG capsule  Commonly known as:  PAMELOR  Take 10 mg by mouth 3 (three) times daily.     oxyCODONE-acetaminophen 10-325 MG tablet  Commonly known as:  PERCOCET  Take 1 tablet by mouth every 6 (six) hours as needed for pain.     PROAIR HFA 108 (90 Base) MCG/ACT inhaler  Generic drug:  albuterol  Inhale into the lungs.     tamsulosin 0.4 MG Caps capsule  Commonly known as:  FLOMAX  Take 1 capsule (0.4 mg total) by mouth daily.     Venlafaxine HCl 225 MG Tb24  Take 2 tablets by mouth daily.        Allergies: No Known Allergies  Family  History: Family History  Problem Relation Age of Onset  . Heart disease Father     Social History:  reports that he has quit smoking. His smokeless tobacco use includes Chew. He reports that he does not drink alcohol or use illicit drugs.   ROS: UROLOGY Frequent Urination?: Yes Hard to postpone urination?: Yes Burning/pain with urination?: Yes Get up at night to urinate?: Yes Leakage of urine?: Yes Urine stream starts and stops?: Yes Trouble starting stream?: No Do you have to strain to urinate?: Yes Blood in urine?: No Urinary tract infection?: No Sexually transmitted disease?: No Injury to kidneys or bladder?: No Painful intercourse?: No Weak stream?: No Erection problems?: Yes Penile pain?: No Gastrointestinal Nausea?: No Vomiting?: No Indigestion/heartburn?: No Diarrhea?: No Constipation?: Yes Constitutional Fever: No Night sweats?: No Weight loss?: No Fatigue?: No Skin Skin rash/lesions?: No Itching?: No Eyes Blurred vision?: No Double vision?: No Ears/Nose/Throat Sore throat?: No Sinus problems?: No Hematologic/Lymphatic Swollen glands?: No Easy bruising?: No Cardiovascular Leg swelling?: No Chest pain?: No Respiratory Cough?: No Shortness of breath?: No Endocrine Excessive thirst?: No Musculoskeletal Back pain?: Yes Joint pain?: Yes Neurological Headaches?: No Dizziness?: No Psychologic Depression?: No Anxiety?: No   Physical Exam: BP 144/77 mmHg  Pulse 48  Ht 5\' 8"  (1.727 m)  Wt 140 lb (63.504 kg)  BMI 21.29 kg/m2  Constitutional:  Alert and oriented, No acute distress. Ambulating with walker.  Presents with wife today HEENT: [place] AT, moist mucus membranes.  Trachea midline, no masses. Cardiovascular: No clubbing, cyanosis, or edema. Respiratory: Normal respiratory effort, no increased work of breathing. GI: Abdomen is soft, nontender, nondistended, no abdominal masses GU: No CVA tenderness.  30g gland, no nodules.  Normal external  sphincter.   No appreciable  Skin: No rashes, bruises or suspicious lesions. Neurologic: Grossly intact, no focal deficits, moving all 4 extremities. Psychiatric: Normal mood and affect.   Labs/ UA: Results for orders placed or performed in visit on 12/04/15  Microscopic Examination  Result Value Ref Range   WBC, UA None seen 0 -  5 /hpf   RBC, UA 0-2 0 -  2 /hpf   Epithelial Cells (non renal) None seen 0 - 10 /hpf  Bacteria, UA None seen None seen/Few  PSA  Result Value Ref Range   Prostate Specific Ag, Serum 7.5 (H) 0.0 - 4.0 ng/mL  Urinalysis, Complete  Result Value Ref Range   Specific Gravity, UA 1.005 1.005 - 1.030   pH, UA 6.0 5.0 - 7.5   Color, UA Yellow Yellow   Appearance Ur Clear Clear   Leukocytes, UA Negative Negative   Protein, UA Negative Negative/Trace   Glucose, UA Negative Negative   Ketones, UA Negative Negative   RBC, UA Negative Negative   Bilirubin, UA Negative Negative   Urobilinogen, Ur 0.2 0.2 - 1.0 mg/dL   Nitrite, UA Negative Negative   Microscopic Examination See below:   BLADDER SCAN AMB NON-IMAGING  Result Value Ref Range   Scan Result 38ml    Component     Latest Ref Rng 08/06/2015 12/04/2015  PSA     0.0 - 4.0 ng/mL 6.8 (H) 7.5 (H)    Assessment & Plan:   1. Prostate cancer  Diagnosed in 01/2014 with T1c Gleason 3+3 prostate cancer in 2/12 cores, 1-3% and single core HGPIN dx 01/2014. PSA at time of dx 5.41. Repeat bx 01/2015,  Single focus of atypical cells but no evidence of clinically signficant cancer despite rising PSA to 7.0.  PSA today/ normal DRE. Recommend continuation of active surveillance.   Will contact patient via mychart with PSA results.  -plan for PSA/ DRE in 4 months if stable today  2. Other male erectile dysfunction Continue Caverject 10 mcg - refilled today  3. LUTS/ urinary frequency Behavioral modification discussed today, avoidance of tea Advised to resume flomax Reassess and next visit  4. Rectal  bleeding Urged follow up with PCP to arrange for screening colonscopy.  Discussed importance of this at length.  Return for PSA/ DRE.  Hollice Espy, MD  [facility] [address] [place], [place] [address] [phone]

## 2015-12-05 LAB — PSA: PROSTATE SPECIFIC AG, SERUM: 7.5 ng/mL — AB (ref 0.0–4.0)

## 2015-12-06 ENCOUNTER — Encounter: Payer: Self-pay | Admitting: Urology

## 2015-12-07 ENCOUNTER — Telehealth: Payer: Self-pay

## 2015-12-07 NOTE — Telephone Encounter (Signed)
Patient notified/SW 

## 2015-12-07 NOTE — Telephone Encounter (Signed)
-----   Message from Hollice Espy, MD sent at 12/06/2015 11:14 AM EDT ----- PSA stable around 7.5 (slightly increased but not significantly since last biopsy at which time PSA was 7.0).  Recommend repeat in 4 months as planned.    Hollice Espy, MD

## 2015-12-07 NOTE — Telephone Encounter (Signed)
Left pt mess/SW 

## 2015-12-16 ENCOUNTER — Encounter: Payer: BLUE CROSS/BLUE SHIELD | Admitting: Pain Medicine

## 2015-12-16 ENCOUNTER — Other Ambulatory Visit: Payer: Self-pay | Admitting: Gastroenterology

## 2015-12-16 DIAGNOSIS — R1310 Dysphagia, unspecified: Secondary | ICD-10-CM

## 2015-12-22 ENCOUNTER — Ambulatory Visit: Admission: RE | Admit: 2015-12-22 | Payer: BLUE CROSS/BLUE SHIELD | Source: Ambulatory Visit

## 2016-01-05 ENCOUNTER — Ambulatory Visit
Admission: RE | Admit: 2016-01-05 | Discharge: 2016-01-05 | Disposition: A | Discharge status: Home / Self Care | Payer: BLUE CROSS/BLUE SHIELD | Source: Ambulatory Visit | Attending: Pain Medicine | Admitting: Pain Medicine

## 2016-01-05 DIAGNOSIS — M1611 Unilateral primary osteoarthritis, right hip: Secondary | ICD-10-CM | POA: Diagnosis not present

## 2016-01-05 DIAGNOSIS — G8929 Other chronic pain: Secondary | ICD-10-CM | POA: Diagnosis present

## 2016-01-05 DIAGNOSIS — M25551 Pain in right hip: Secondary | ICD-10-CM | POA: Diagnosis not present

## 2016-01-19 ENCOUNTER — Ambulatory Visit
Admission: RE | Admit: 2016-01-19 | Discharge: 2016-01-19 | Disposition: A | Discharge status: Home / Self Care | Payer: BLUE CROSS/BLUE SHIELD | Source: Ambulatory Visit | Attending: Gastroenterology | Admitting: Gastroenterology

## 2016-01-19 ENCOUNTER — Other Ambulatory Visit: Payer: Self-pay

## 2016-01-19 DIAGNOSIS — R1310 Dysphagia, unspecified: Secondary | ICD-10-CM | POA: Diagnosis not present

## 2016-01-19 DIAGNOSIS — K449 Diaphragmatic hernia without obstruction or gangrene: Secondary | ICD-10-CM | POA: Diagnosis not present

## 2016-01-26 ENCOUNTER — Encounter: Payer: Self-pay | Admitting: *Deleted

## 2016-01-26 ENCOUNTER — Ambulatory Visit: Payer: BLUE CROSS/BLUE SHIELD | Admitting: *Deleted

## 2016-01-26 ENCOUNTER — Ambulatory Visit
Admission: RE | Admit: 2016-01-26 | Discharge: 2016-01-26 | Disposition: A | Discharge status: Home / Self Care | Payer: BLUE CROSS/BLUE SHIELD | Source: Ambulatory Visit | Attending: Gastroenterology | Admitting: Gastroenterology

## 2016-01-26 ENCOUNTER — Encounter
Admission: RE | Disposition: A | Discharge status: Home / Self Care | Payer: Self-pay | Source: Ambulatory Visit | Attending: Gastroenterology

## 2016-01-26 DIAGNOSIS — R634 Abnormal weight loss: Secondary | ICD-10-CM | POA: Insufficient documentation

## 2016-01-26 DIAGNOSIS — K625 Hemorrhage of anus and rectum: Secondary | ICD-10-CM | POA: Insufficient documentation

## 2016-01-26 DIAGNOSIS — Z79891 Long term (current) use of opiate analgesic: Secondary | ICD-10-CM | POA: Insufficient documentation

## 2016-01-26 DIAGNOSIS — K449 Diaphragmatic hernia without obstruction or gangrene: Secondary | ICD-10-CM | POA: Insufficient documentation

## 2016-01-26 DIAGNOSIS — K2950 Unspecified chronic gastritis without bleeding: Secondary | ICD-10-CM | POA: Diagnosis not present

## 2016-01-26 DIAGNOSIS — Z7982 Long term (current) use of aspirin: Secondary | ICD-10-CM | POA: Insufficient documentation

## 2016-01-26 DIAGNOSIS — D125 Benign neoplasm of sigmoid colon: Secondary | ICD-10-CM | POA: Diagnosis not present

## 2016-01-26 DIAGNOSIS — D123 Benign neoplasm of transverse colon: Secondary | ICD-10-CM | POA: Insufficient documentation

## 2016-01-26 DIAGNOSIS — R1310 Dysphagia, unspecified: Secondary | ICD-10-CM | POA: Insufficient documentation

## 2016-01-26 DIAGNOSIS — K210 Gastro-esophageal reflux disease with esophagitis: Secondary | ICD-10-CM | POA: Insufficient documentation

## 2016-01-26 DIAGNOSIS — F329 Major depressive disorder, single episode, unspecified: Secondary | ICD-10-CM | POA: Insufficient documentation

## 2016-01-26 DIAGNOSIS — Z8673 Personal history of transient ischemic attack (TIA), and cerebral infarction without residual deficits: Secondary | ICD-10-CM | POA: Diagnosis not present

## 2016-01-26 DIAGNOSIS — M1990 Unspecified osteoarthritis, unspecified site: Secondary | ICD-10-CM | POA: Diagnosis not present

## 2016-01-26 DIAGNOSIS — Z8546 Personal history of malignant neoplasm of prostate: Secondary | ICD-10-CM | POA: Insufficient documentation

## 2016-01-26 DIAGNOSIS — K644 Residual hemorrhoidal skin tags: Secondary | ICD-10-CM | POA: Diagnosis not present

## 2016-01-26 DIAGNOSIS — K2210 Ulcer of esophagus without bleeding: Secondary | ICD-10-CM | POA: Diagnosis not present

## 2016-01-26 DIAGNOSIS — Z79899 Other long term (current) drug therapy: Secondary | ICD-10-CM | POA: Diagnosis not present

## 2016-01-26 DIAGNOSIS — K621 Rectal polyp: Secondary | ICD-10-CM | POA: Insufficient documentation

## 2016-01-26 DIAGNOSIS — E785 Hyperlipidemia, unspecified: Secondary | ICD-10-CM | POA: Insufficient documentation

## 2016-01-26 DIAGNOSIS — N400 Enlarged prostate without lower urinary tract symptoms: Secondary | ICD-10-CM | POA: Insufficient documentation

## 2016-01-26 HISTORY — PX: ESOPHAGOGASTRODUODENOSCOPY (EGD) WITH PROPOFOL: SHX5813

## 2016-01-26 HISTORY — PX: COLONOSCOPY WITH PROPOFOL: SHX5780

## 2016-01-26 SURGERY — COLONOSCOPY WITH PROPOFOL
Anesthesia: General

## 2016-01-26 MED ORDER — PROPOFOL 10 MG/ML IV BOLUS
INTRAVENOUS | Status: DC | PRN
  Administered 2016-01-26 (×2): 50 mg via INTRAVENOUS
  Administered 2016-01-26: 20 mg via INTRAVENOUS
  Administered 2016-01-26 (×8): 50 mg via INTRAVENOUS

## 2016-01-26 MED ORDER — SODIUM CHLORIDE 0.9 % IV SOLN: INTRAVENOUS | Status: DC

## 2016-01-26 MED ORDER — PROPOFOL 500 MG/50ML IV EMUL
INTRAVENOUS | Status: DC | PRN
  Administered 2016-01-26: 100 ug/kg/min via INTRAVENOUS

## 2016-01-26 MED ORDER — SODIUM CHLORIDE 0.9 % IV SOLN
INTRAVENOUS | Status: DC
  Administered 2016-01-26 (×2): via INTRAVENOUS

## 2016-01-26 NOTE — Transfer of Care (Signed)
Immediate Anesthesia Transfer of Care Note  Patient: Mark Hodges  Procedure(s) Performed: Procedure(s): COLONOSCOPY WITH PROPOFOL (N/A) ESOPHAGOGASTRODUODENOSCOPY (EGD) WITH PROPOFOL (N/A)  Patient Location: PACU  Anesthesia Type:General  Level of Consciousness: sedated  Airway & Oxygen Therapy: Patient Spontanous Breathing and Patient connected to nasal cannula oxygen  Post-op Assessment: Report given to RN and Post -op Vital signs reviewed and stable  Post vital signs: Reviewed and stable  Last Vitals:  Filed Vitals:   01/26/16 0743 01/26/16 0930  BP: 128/79 83/49  Pulse: 86 79  Temp: 35.7 C 36 C  Resp: 18 18    Last Pain: There were no vitals filed for this visit.       Complications: No apparent anesthesia complications

## 2016-01-26 NOTE — Op Note (Signed)
[facility] Gastroenterology Patient Name: Mark Hodges Procedure Date: 01/26/2016 8:34 AM MRN: [id number] Account #: [id number] Date of Birth: August 21, 1956 Admit Type: Outpatient Age: 60 Room: [facility] ENDO ROOM 3 Gender: Male Note Status: Finalized Procedure:            Upper GI endoscopy Indications:          Dysphagia Providers:            Lollie Sails, MD Referring MD:         Tracie Harrier, MD (Referring MD) Medicines:            Monitored Anesthesia Care Complications:        No immediate complications. Procedure:            Pre-Anesthesia Assessment:                       - ASA Grade Assessment: III - A patient with severe                        systemic disease.                       After obtaining informed consent, the endoscope was                        passed under direct vision. Throughout the procedure,                        the patient's blood pressure, pulse, and oxygen                        saturations were monitored continuously. The Endoscope                        was introduced through the mouth, and advanced to the                        second part of duodenum. The upper GI endoscopy was                        performed with moderate difficulty due to ineffective                        sedation. Successful completion of the procedure was                        aided by receiving assistance from additional staff.                        The patient tolerated the procedure well. Findings:      LA Grade D (one or more mucosal breaks involving at least 75% of       esophageal circumference) esophagitis with no bleeding was found.      The Z-line was variable. Biopsies were taken with a cold forceps for       histology.      Diffuse mild inflammation characterized by adherent blood, congestion       (edema) and erosions was found in the gastric body and in the gastric       antrum. Biopsies were taken with a cold forceps for histology.  Biopsies  were taken with a cold forceps for histology and Helicobacter pylori       testing.      A small hiatal hernia was found. The Z-line was a variable distance from       incisors; the hiatal hernia was sliding.      The duodenal bulb and second portion of the duodenum were normal. I       could not advance further due to sharp angulation of the C-loop.      The cardia and gastric fundus were normal on retroflexion otherwise. Impression:           - LA Grade D erosive esophagitis.                       - Z-line variable. Biopsied.                       - Erosive gastritis. Biopsied.                       - Small hiatal hernia.                       - Normal duodenal bulb and second portion of the                        duodenum. Recommendation:       - Await pathology results.                       - Use Protonix (pantoprazole) 40 mg PO daily daily.                       - Perform a colonoscopy today.                       - Return to GI clinic in 1 month. Procedure Code(s):    --- Professional ---                       [phone], Esophagogastroduodenoscopy, flexible, transoral;                        with biopsy, single or multiple Diagnosis Code(s):    --- Professional ---                       K20.8, Other esophagitis                       K22.8, Other specified diseases of esophagus                       K29.60, Other gastritis without bleeding                       K44.9, Diaphragmatic hernia without obstruction or                        gangrene                       R13.10, Dysphagia, unspecified CPT copyright 2016 American Medical Association. All rights reserved. The codes documented in this report are preliminary and upon coder review may  be revised to meet  current compliance requirements. Lollie Sails, MD 01/26/2016 9:01:09 AM This report has been signed electronically. Number of Addenda: 0 Note Initiated On: 01/26/2016 8:34 AM      [facility]

## 2016-01-26 NOTE — Op Note (Signed)
[facility] Gastroenterology Patient Name: Alanzo Garrette Procedure Date: 01/26/2016 8:34 AM MRN: [id number] Account #: [id number] Date of Birth: 1956-05-29 Admit Type: Outpatient Age: 60 Room: [facility] ENDO ROOM 3 Gender: Male Note Status: Finalized Procedure:            Colonoscopy Indications:          Rectal bleeding Providers:            Lollie Sails, MD Referring MD:         Tracie Harrier, MD (Referring MD) Medicines:            Monitored Anesthesia Care Complications:        No immediate complications. Procedure:            Pre-Anesthesia Assessment:                       - ASA Grade Assessment: III - A patient with severe                        systemic disease.                       After obtaining informed consent, the colonoscope was                        passed under direct vision. Throughout the procedure,                        the patient's blood pressure, pulse, and oxygen                        saturations were monitored continuously. The                        Colonoscope was introduced through the anus and                        advanced to the the cecum, identified by appendiceal                        orifice and ileocecal valve. The colonoscopy was                        performed without difficulty. The patient tolerated the                        procedure well. The colonoscopy was performed without                        difficulty. The patient tolerated the procedure well. Findings:      Three sessile polyps were found in the proximal transverse colon. The       polyps were 3 to 4 mm in size. These polyps were removed with a cold       snare. Resection and retrieval were complete.      A 5 mm polyp was found in the sigmoid colon. The polyp was sessile. The       polyp was removed with a cold snare. Resection and retrieval were       complete.      Five sessile polyps were found in the rectum. The polyps were 1  to 2 mm       in size.  These polyps were removed with a cold biopsy forceps. Resection       and retrieval were complete.      The retroflexed view of the distal rectum and anal verge was normal and       showed no anal or rectal abnormalities.      The digital rectal exam findings include enlarged prostate.      The perianal exam findings include skin tags.      Non-bleeding internal hemorrhoids were found during retroflexion and       during anoscopy. The hemorrhoids were small and Grade I (internal       hemorrhoids that do not prolapse). Impression:           - Three 3 to 4 mm polyps in the proximal transverse                        colon, removed with a cold snare. Resected and                        retrieved.                       - One 5 mm polyp in the sigmoid colon, removed with a                        cold snare. Resected and retrieved.                       - Five 1 to 2 mm polyps in the rectum, removed with a                        cold biopsy forceps. Resected and retrieved.                       - The distal rectum and anal verge are normal on                        retroflexion view.                       - Enlarged prostate found on digital rectal exam.                       - Perianal skin tags found on perianal exam. Recommendation:       - Discharge patient to home.                       - Return to GI clinic in 1 month. Procedure Code(s):    --- Professional ---                       [phone], Colonoscopy, flexible; with removal of tumor(s),                        polyp(s), or other lesion(s) by snare technique                       [id number], 59, Colonoscopy, flexible; with biopsy, single  or multiple Diagnosis Code(s):    --- Professional ---                       D12.3, Benign neoplasm of transverse colon (hepatic                        flexure or splenic flexure)                       K62.1, Rectal polyp                       D12.5, Benign neoplasm of sigmoid colon                        K64.4, Residual hemorrhoidal skin tags                       K62.5, Hemorrhage of anus and rectum                       N40.0, Benign prostatic hyperplasia without lower                        urinary tract symptoms CPT copyright 2016 American Medical Association. All rights reserved. The codes documented in this report are preliminary and upon coder review may  be revised to meet current compliance requirements. Lollie Sails, MD 01/26/2016 9:35:29 AM This report has been signed electronically. Number of Addenda: 0 Note Initiated On: 01/26/2016 8:34 AM Scope Withdrawal Time: 0 hours 9 minutes 11 seconds  Total Procedure Duration: 0 hours 22 minutes 17 seconds       [facility]

## 2016-01-26 NOTE — Anesthesia Postprocedure Evaluation (Signed)
Anesthesia Post Note  Patient: TOBBY NEUSTADT  Procedure(s) Performed: Procedure(s) (LRB): COLONOSCOPY WITH PROPOFOL (N/A) ESOPHAGOGASTRODUODENOSCOPY (EGD) WITH PROPOFOL (N/A)  Patient location during evaluation: PACU Anesthesia Type: General Level of consciousness: awake Pain management: pain level controlled Vital Signs Assessment: post-procedure vital signs reviewed and stable Respiratory status: spontaneous breathing Cardiovascular status: blood pressure returned to baseline Anesthetic complications: no    Last Vitals:  Filed Vitals:   01/26/16 0930 01/26/16 0940  BP: 83/49 98/61  Pulse: 79 76  Temp: 36 C   Resp: 18 20    Last Pain: There were no vitals filed for this visit.               VAN STAVEREN,Toni Demo

## 2016-01-26 NOTE — H&P (Signed)
Outpatient short stay form Pre-procedure 01/26/2016 8:33 AM Lollie Sails MD  Primary Physician: Dr Tracie Harrier  Reason for visit:  EGD and colonoscopy  History of present illness:  Patient is a 60 year old male presenting today for EGD and colonoscopy. He has had a couple of episodes of some rectal bleeding as well as some weight loss. Never had a colonoscopy before. He also has some complaint of dysphagia. He had a barium swallow with a tablet done on 01/19/2016. This showed a slightly prominent cricopharyngeus muscle and the tablet passed without difficulty. Does have a small sliding hiatal hernia.  He does take a 325 mg aspirin but has held that for last 5 days. He takes no other blood thinning agents. He tolerated his prep well.    Current facility-administered medications:  .  0.9 %  sodium chloride infusion, , Intravenous, Continuous, Lollie Sails, MD, Last Rate: 20 mL/hr at 01/26/16 0757 .  0.9 %  sodium chloride infusion, , Intravenous, Continuous, Lollie Sails, MD  Prescriptions prior to admission  Medication Sig Dispense Refill Last Dose  . aspirin 325 MG tablet Take by mouth.   Past Week at Unknown time  . albuterol (PROAIR HFA) 108 (90 BASE) MCG/ACT inhaler Inhale into the lungs.   Taking  . alprostadil (EDEX) 10 MCG injection 10 mcg by Intracavitary route as needed for erectile dysfunction. use no more than 3 times per week 1 each 12   . atorvastatin (LIPITOR) 10 MG tablet Take 10 mg by mouth daily.    Taking  . gabapentin (NEURONTIN) 300 MG capsule Take 3 capsules (900 mg total) by mouth 3 (three) times daily. Follow titration schedule. 810 capsule 0 Taking  . nortriptyline (PAMELOR) 10 MG capsule Take 10 mg by mouth 3 (three) times daily.    Taking  . oxyCODONE-acetaminophen (PERCOCET) 10-325 MG tablet Take 1 tablet by mouth every 6 (six) hours as needed for pain. 120 tablet 0 Taking  . tamsulosin (FLOMAX) 0.4 MG CAPS capsule Take 1 capsule (0.4 mg total)  by mouth daily. 90 capsule 3   . Venlafaxine HCl 225 MG TB24 Take 2 tablets by mouth daily.    Taking     No Known Allergies   Past Medical History  Diagnosis Date  . Prostate cancer ([place])   . BPH (benign prostatic hyperplasia)   . Erectile dysfunction   . Premature ejaculation   . Hypogonadism in male   . Hyperlipidemia   . Lower urinary tract infection   . TIA (transient ischemic attack)   . Stroke ([place])   . Arthritis   . Depression     Review of systems:      Physical Exam    Heart and lungs: Regular rate and rhythm without rub or gallop, lungs are bilaterally clear    HEENT: Normocephalic atraumatic eyes are anicteric    Other:     Pertinant exam for procedure: Soft nontender nondistended bowel sounds positive normoactive.    Planned proceedures: EGD and colonoscopy with indicated procedures. I have discussed the risks benefits and complications of procedures to include not limited to bleeding, infection, perforation and the risk of sedation and the patient wishes to proceed.    Lollie Sails, MD Gastroenterology 01/26/2016  8:33 AM

## 2016-01-26 NOTE — Anesthesia Preprocedure Evaluation (Signed)
Anesthesia Evaluation  Patient identified by MRN, date of birth, ID band Patient awake    Reviewed: Allergy & Precautions, NPO status , Patient's Chart, lab work & pertinent test results  Airway Mallampati: II       Dental  (+) Edentulous Upper, Edentulous Lower   Pulmonary COPD, former smoker,           Cardiovascular  Rhythm:Regular     Neuro/Psych    GI/Hepatic negative GI ROS, Neg liver ROS,   Endo/Other  negative endocrine ROS  Renal/GU negative Renal ROS     Musculoskeletal   Abdominal Normal abdominal exam  (+)   Peds  Hematology negative hematology ROS (+)   Anesthesia Other Findings   Reproductive/Obstetrics                             Anesthesia Physical Anesthesia Plan  ASA: III  Anesthesia Plan: General   Post-op Pain Management:    Induction: Intravenous  Airway Management Planned: Natural Airway and Nasal Cannula  Additional Equipment:   Intra-op Plan:   Post-operative Plan:   Informed Consent: I have reviewed the patients History and Physical, chart, labs and discussed the procedure including the risks, benefits and alternatives for the proposed anesthesia with the patient or authorized representative who has indicated his/her understanding and acceptance.     Plan Discussed with: CRNA  Anesthesia Plan Comments:         Anesthesia Quick Evaluation

## 2016-01-27 LAB — SURGICAL PATHOLOGY

## 2016-01-29 ENCOUNTER — Encounter: Payer: Self-pay | Admitting: Gastroenterology

## 2016-02-08 ENCOUNTER — Encounter: Payer: Self-pay | Admitting: Pain Medicine

## 2016-02-08 ENCOUNTER — Ambulatory Visit: Payer: BLUE CROSS/BLUE SHIELD | Attending: Pain Medicine | Admitting: Pain Medicine

## 2016-02-08 VITALS — BP 122/76 | HR 95 | Temp 98.2°F | Resp 16 | Ht 68.0 in | Wt 155.0 lb

## 2016-02-08 DIAGNOSIS — Z8546 Personal history of malignant neoplasm of prostate: Secondary | ICD-10-CM | POA: Insufficient documentation

## 2016-02-08 DIAGNOSIS — M25551 Pain in right hip: Secondary | ICD-10-CM | POA: Diagnosis not present

## 2016-02-08 DIAGNOSIS — K449 Diaphragmatic hernia without obstruction or gangrene: Secondary | ICD-10-CM | POA: Diagnosis not present

## 2016-02-08 DIAGNOSIS — M792 Neuralgia and neuritis, unspecified: Secondary | ICD-10-CM

## 2016-02-08 DIAGNOSIS — M25559 Pain in unspecified hip: Secondary | ICD-10-CM | POA: Diagnosis present

## 2016-02-08 DIAGNOSIS — M160 Bilateral primary osteoarthritis of hip: Secondary | ICD-10-CM

## 2016-02-08 DIAGNOSIS — E785 Hyperlipidemia, unspecified: Secondary | ICD-10-CM | POA: Insufficient documentation

## 2016-02-08 DIAGNOSIS — M545 Low back pain, unspecified: Secondary | ICD-10-CM

## 2016-02-08 DIAGNOSIS — F419 Anxiety disorder, unspecified: Secondary | ICD-10-CM | POA: Insufficient documentation

## 2016-02-08 DIAGNOSIS — G8929 Other chronic pain: Secondary | ICD-10-CM | POA: Diagnosis not present

## 2016-02-08 DIAGNOSIS — M1288 Other specific arthropathies, not elsewhere classified, other specified site: Secondary | ICD-10-CM | POA: Insufficient documentation

## 2016-02-08 DIAGNOSIS — R262 Difficulty in walking, not elsewhere classified: Secondary | ICD-10-CM | POA: Insufficient documentation

## 2016-02-08 DIAGNOSIS — M47816 Spondylosis without myelopathy or radiculopathy, lumbar region: Secondary | ICD-10-CM | POA: Diagnosis not present

## 2016-02-08 DIAGNOSIS — M25569 Pain in unspecified knee: Secondary | ICD-10-CM | POA: Diagnosis present

## 2016-02-08 DIAGNOSIS — Z79891 Long term (current) use of opiate analgesic: Secondary | ICD-10-CM

## 2016-02-08 DIAGNOSIS — M79606 Pain in leg, unspecified: Secondary | ICD-10-CM | POA: Diagnosis present

## 2016-02-08 DIAGNOSIS — M542 Cervicalgia: Secondary | ICD-10-CM | POA: Diagnosis not present

## 2016-02-08 DIAGNOSIS — M1611 Unilateral primary osteoarthritis, right hip: Secondary | ICD-10-CM | POA: Insufficient documentation

## 2016-02-08 DIAGNOSIS — J449 Chronic obstructive pulmonary disease, unspecified: Secondary | ICD-10-CM | POA: Insufficient documentation

## 2016-02-08 DIAGNOSIS — N529 Male erectile dysfunction, unspecified: Secondary | ICD-10-CM | POA: Insufficient documentation

## 2016-02-08 DIAGNOSIS — Z8673 Personal history of transient ischemic attack (TIA), and cerebral infarction without residual deficits: Secondary | ICD-10-CM | POA: Insufficient documentation

## 2016-02-08 DIAGNOSIS — M47812 Spondylosis without myelopathy or radiculopathy, cervical region: Secondary | ICD-10-CM | POA: Diagnosis not present

## 2016-02-08 DIAGNOSIS — R1310 Dysphagia, unspecified: Secondary | ICD-10-CM | POA: Insufficient documentation

## 2016-02-08 DIAGNOSIS — E7800 Pure hypercholesterolemia, unspecified: Secondary | ICD-10-CM | POA: Diagnosis not present

## 2016-02-08 DIAGNOSIS — Z5181 Encounter for therapeutic drug level monitoring: Secondary | ICD-10-CM

## 2016-02-08 DIAGNOSIS — Z87891 Personal history of nicotine dependence: Secondary | ICD-10-CM | POA: Insufficient documentation

## 2016-02-08 MED ORDER — GABAPENTIN 300 MG PO CAPS: 900.0000 mg | ORAL_CAPSULE | Freq: Three times a day (TID) | ORAL | Status: DC

## 2016-02-08 MED ORDER — OXYCODONE-ACETAMINOPHEN 10-325 MG PO TABS: 1.0000 | ORAL_TABLET | Freq: Every day | ORAL | Status: DC | PRN

## 2016-02-08 NOTE — Patient Instructions (Addendum)
Prescription given for oxycodone 10mg  to last until 03-20-2016  Facet Blocks Patient Information  Description: The facets are joints in the spine between the vertebrae.  Like any joints in the body, facets can become irritated and painful.  Arthritis can also effect the facets.  By injecting steroids and local anesthetic in and around these joints, we can temporarily block the nerve supply to them.  Steroids act directly on irritated nerves and tissues to reduce selling and inflammation which often leads to decreased pain.  Facet blocks may be done anywhere along the spine from the neck to the low back depending upon the location of your pain.   After numbing the skin with local anesthetic (like Novocaine), a small needle is passed onto the facet joints under x-ray guidance.  You may experience a sensation of pressure while this is being done.  The entire block usually lasts about 15-25 minutes.   Conditions which may be treated by facet blocks:   Low back/buttock pain  Neck/shoulder pain  Certain types of headaches  Preparation for the injection:  1. Do not eat any solid food or dairy products within 8 hours of your appointment. 2. You may drink clear liquid up to 3 hours before appointment.  Clear liquids include water, black coffee, juice or soda.  No milk or cream please. 3. You may take your regular medication, including pain medications, with a sip of water before your appointment.  Diabetics should hold regular insulin (if taken separately) and take 1/2 normal NPH dose the morning of the procedure.  Carry some sugar containing items with you to your appointment. 4. A driver must accompany you and be prepared to drive you home after your procedure. 5. Bring all your current medications with you. 6. An IV may be inserted and sedation may be given at the discretion of the physician. 7. A blood pressure cuff, EKG and other monitors will often be applied during the procedure.  Some patients  may need to have extra oxygen administered for a short period. 8. You will be asked to provide medical information, including your allergies and medications, prior to the procedure.  We must know immediately if you are taking blood thinners (like Coumadin/Warfarin) or if you are allergic to IV iodine contrast (dye).  We must know if you could possible be pregnant.  Possible side-effects:   Bleeding from needle site  Infection (rare, may require surgery)  Nerve injury (rare)  Numbness & tingling (temporary)  Difficulty urinating (rare, temporary)  Spinal headache (a headache worse with upright posture)  Light-headedness (temporary)  Pain at injection site (serveral days)  Decreased blood pressure (rare, temporary)  Weakness in arm/leg (temporary)  Pressure sensation in back/neck (temporary)   Call if you experience:   Fever/chills associated with headache or increased back/neck pain  Headache worsened by an upright position  New onset, weakness or numbness of an extremity below the injection site  Hives or difficulty breathing (go to the emergency room)  Inflammation or drainage at the injection site(s)  Severe back/neck pain greater than usual  New symptoms which are concerning to you  Please note:  Although the local anesthetic injected can often make your back or neck feel good for several hours after the injection, the pain will likely return. It takes 3-7 days for steroids to work.  You may not notice any pain relief for at least one week.  If effective, we will often do a series of 2-3 injections spaced 3-6 weeks  apart to maximally decrease your pain.  After the initial series, you may be a candidate for a more permanent nerve block of the facets.  If you have any questions, please call #[phone] [facility]

## 2016-02-08 NOTE — Progress Notes (Signed)
Safety precautions to be maintained throughout the outpatient stay will include: orient to surroundings, keep bed in low position, maintain call bell within reach at all times, provide assistance with transfer out of bed and ambulation. Percocet pill count # 11/120  Filled 01-09-16

## 2016-02-08 NOTE — Progress Notes (Signed)
Patient's Name: Mark Hodges  Patient type: Established  MRN: [id number]  Service setting: Ambulatory outpatient  DOB: 04-16-1956  Location: [facility]  DOS: 02/08/2016  Primary Care Physician: Tracie Harrier, MD  Note by: Kathlen Brunswick. Dossie Arbour, M.D, DABA, DABAPM, DABPM, Milagros Evener, FIPP  Referring Physician: Tracie Harrier, MD  Specialty: Board-Certified Interventional Pain Management  Last Visit to Pain Management: 11/19/2015   Primary Reason(s) for Visit: Encounter for prescription drug management (Level of risk: moderate) CC: Hip Pain; Leg Pain; Knee Pain; and Back Pain   HPI  Mark Hodges is a 60 y.o. year old, male patient, who returns today as an established patient. He has Difficulty in walking; Compulsive tobacco user syndrome; Hypercholesterolemia without hypertriglyceridemia; Long term current use of opiate analgesic; Long term prescription opiate use; Opiate use; Opiate dependence ([place]); Encounter for therapeutic drug level monitoring; Chronic pain; Chronic low back pain (Location of Secondary source of pain) (Bilateral) (R>L); Chronic lumbar radicular pain (Polyradiculopathy) (Right); Ataxia; HLD (hyperlipidemia); Current tobacco use; Pure hypercholesterolemia; Arm numbness; Substance use disorder Risk: LOW; Chronic upper back pain; Cervical spondylosis; Chronic neck pain; Cervicogenic headache; Chronic radicular cervical pain; Chronic hip pain (Location of Primary Source of Pain) (Right); Generalized anxiety disorder; Depression; At high risk for falls; Erectile dysfunction; History of TIA (transient ischemic attack); 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 His primarily concern today is the Hip Pain; Leg Pain; Knee Pain; and Back Pain   Pain Assessment: Self-Reported Pain Score: 6  Reported level is compatible with observation Pain Type: Chronic pain Pain Location: Hip Pain Orientation: Right Pain Descriptors / Indicators: Sharp, Aching Pain Frequency: Constant  The patient comes into the clinics today for pharmacological management of his chronic pain. I last saw this patient on 11/19/2015. His body mass index is 23.57 kg/(m^2). The patient  reports that he does not use illicit drugs.  Date of Last Visit: 11/19/15 Service Provided on Last Visit: Med Refill  Controlled Substance Pharmacotherapy Assessment & REMS (Risk Evaluation and Mitigation Strategy)  Analgesic: Previously he was taking oxycodone/APAP 10/325 one tablet by mouth every 6 hours (40 mg/day). Pill Count: Percocet pill count # 11/120 Filled 01-09-16. MME/day: 60 mg/day Pharmacokinetics: Onset of action (Liberation/Absorption): Within expected pharmacological parameters Time to Peak effect (Distribution): Timing and results are as within normal expected parameters Duration of action (Metabolism/Excretion): Within normal limits for medication Pharmacodynamics: Analgesic Effect: More than 50% Activity Facilitation: Medication(s) allow patient to sit, stand, walk, and do the basic ADLs Perceived Effectiveness: Described as relatively effective, allowing for increase in activities of daily living (ADL) Side-effects or Adverse reactions: None reported Monitoring: [place] PMP: Online review of the past 22-month period conducted. Compliant with practice rules and regulations UDS Results/interpretation:  The patient's last UDS was done on 10/28/2015 and it was read as abnormal due to the unexpected absence of oxycodone in the urine sample. Prior to that the patient had been seen on 07/29/2015 at which time he had been given some oxycodone/APAP 10/325. When he came in on 10/28/2015, he  indicated that the medication was not working and at that point I went ahead and stopped it. He later came back indicating that it was in fact working, just not as well as he wanted it to. At this point we went ahead and put him back and he seems to be doing okay for now. Medication Assessment Form: Reviewed. Patient indicates being compliant with therapy Treatment compliance: Compliant Risk Assessment: Aberrant Behavior: None observed today Substance Use Disorder (SUD) Risk Level: Moderate-to-high Risk of opioid abuse or dependence: 0.7-3.0% with doses ? 36 MME/day and 6.1-26% with doses ? 120 MME/day. Opioid Risk Tool (ORT) Score: Total Score: 4 Moderate Risk for SUD (Score between 4-7) Depression Scale Score: PHQ-2: PHQ-2 Total Score: 0 No depression (0) PHQ-9: PHQ-9 Total Score: 0 No depression (0-4)  Pharmacologic Plan: The patient had been prescribed oxycodone/APAP 10/325 to be taken one every 6 hours PRN for pain. However, it would appear that does not radiate he has been taking it and therefore today we will be changing the prescription to reflect what he has indicated he takes. on 11/19/2015 the patient was given a prescription for oxycodone/APAP 10/325 #120 to take 1 tablet by mouth every 6 hours PRN for pain this was supposed to have lasted until 12/19/2015. According to the [place] (Prescription Monitoring Program) the patient had that prescription filled on 01/09/2016.  Laboratory Chemistry  Inflammation Markers Lab Results  Component Value Date   ESRSEDRATE 5 11/19/2015   CRP 1.4* 11/19/2015    Renal Function Lab Results  Component Value Date   BUN 5* 11/19/2015   CREATININE 0.73 11/19/2015    GFRAA >60 11/19/2015   GFRNONAA >60 11/19/2015    Hepatic Function Lab Results  Component Value Date   AST 25 11/19/2015   ALT 24 11/19/2015   ALBUMIN 4.3 11/19/2015    Electrolytes Lab Results  Component Value Date   NA 140 11/19/2015   K 3.9 11/19/2015   CL 104 11/19/2015   CALCIUM 9.7 11/19/2015   MG 2.0 11/19/2015    Pain Modulating Vitamins No results found for: [place], VD125OH2TOT, [id number], [id number], VITAMINB12  Coagulation Parameters No results found for: INR, LABPROT  Note: I personally reviewed the above data. Results made available to patient.  Recent Diagnostic Imaging  Dg Esophagus  01/19/2016  CLINICAL DATA:  Dysphagia. EXAM: ESOPHOGRAM / BARIUM SWALLOW / BARIUM TABLET STUDY TECHNIQUE: Combined double contrast and single contrast examination performed using effervescent crystals, thick barium liquid, and thin barium liquid. The patient was observed with fluoroscopy swallowing a 13 mm barium sulphate tablet. FLUOROSCOPY TIME:  Radiation Exposure Index (as provided by the fluoroscopic device): 14.1 mGy COMPARISON:  None. FINDINGS: Mild prominence of the cricopharyngeus fold noted. Cervical esophagus otherwise widely patent. Normal peristalsis. Thoracic esophagus widely patent. Small sliding hiatal hernia without evidence of reflux. Standardized barium tablet passed normally. IMPRESSION: 1. Slightly prominent cricopharyngeus muscle. 2. Small sliding hiatal hernia. No significant reflux. Exam is otherwise unremarkable. Electronically Signed   By: Marcello Moores  Register   On: 01/19/2016 09:21    Meds  The patient has a current medication list which includes the following prescription(s): alprostadil, aspirin, atorvastatin, gabapentin, nortriptyline, oxycodone-acetaminophen, tamsulosin, and venlafaxine hcl.  Current Outpatient Prescriptions on File Prior to Visit  Medication Sig  . alprostadil (EDEX) 10 MCG injection 10 mcg by Intracavitary route as needed for erectile  dysfunction. use no more than 3 times per week  . aspirin 325 MG tablet Take by mouth.  Marland Kitchen atorvastatin (LIPITOR) 10 MG  tablet Take 10 mg by mouth daily.   . nortriptyline (PAMELOR) 10 MG capsule Take 10 mg by mouth 3 (three) times daily.   . tamsulosin (FLOMAX) 0.4 MG CAPS capsule Take 1 capsule (0.4 mg total) by mouth daily.  . Venlafaxine HCl 225 MG TB24 Take 2 tablets by mouth daily.    No current facility-administered medications on file prior to visit.    ROS  Constitutional: Denies any fever or chills Gastrointestinal: No reported hemesis, hematochezia, vomiting, or acute GI distress Musculoskeletal: Denies any acute onset joint swelling, redness, loss of ROM, or weakness Neurological: No reported episodes of acute onset apraxia, aphasia, dysarthria, agnosia, amnesia, paralysis, loss of coordination, or loss of consciousness  Allergies  Mr. Vivian has No Known Allergies.  [place]  Medical:  Mr. Boyajian  has a past medical history of Prostate cancer [facility]); BPH (benign prostatic hyperplasia); Erectile dysfunction; Premature ejaculation; Hypogonadism in male; Hyperlipidemia; Lower urinary tract infection; TIA (transient ischemic attack); Stroke [facility]); Arthritis; Depression; and Clinical depression (10/23/2014). Family: family history includes Heart disease in his father. Surgical:  has past surgical history that includes Tonsillectomy; Colonoscopy with propofol (N/A, 01/26/2016); and Esophagogastroduodenoscopy (egd) with propofol (N/A, 01/26/2016). Tobacco:  reports that he has quit smoking. His smokeless tobacco use includes Chew. Alcohol:  reports that he does not drink alcohol. Drug:  reports that he does not use illicit drugs.  Constitutional Exam  Vitals: Blood pressure 122/76, pulse 95, temperature 98.2 F (36.8 C), resp. rate 16, height 5\' 8"  (1.727 m), weight 155 lb (70.308 kg), SpO2 100 %. General appearance: Well nourished, well developed, and well hydrated. In no acute  distress Calculated BMI/Body habitus: Body mass index is 23.57 kg/(m^2). (18.5-24.9 kg/m2) Ideal body weight Psych/Mental status: Alert and oriented x 3 (person, place, & time) Eyes: PERLA Respiratory: No evidence of acute respiratory distress  Cervical Spine Exam  Inspection: No masses, redness, or swelling Alignment: Symmetrical ROM: Functional: Adequate ROM Active: Unrestricted ROM Stability: No instability detected Muscle strength & Tone: Functionally intact Sensory: Unimpaired Palpation: No complaints of tenderness  Upper Extremity (UE) Exam    Side: Right upper extremity  Side: Left upper extremity  Inspection: No masses, redness, swelling, or asymmetry  Inspection: No masses, redness, swelling, or asymmetry  ROM:  ROM:  Functional: Adequate ROM  Functional: Adequate ROM  Active: Unrestricted ROM  Active: Unrestricted ROM  Muscle strength & Tone: Functionally intact  Muscle strength & Tone: Functionally intact  Sensory: Unimpaired  Sensory: Unimpaired  Palpation: Non-contributory  Palpation: Non-contributory   Thoracic Spine Exam  Inspection: No masses, redness, or swelling Alignment: Symmetrical ROM: Functional: Adequate ROM Active: Unrestricted ROM Stability: No instability detected Sensory: Unimpaired Muscle strength & Tone: Functionally intact Palpation: No complaints of tenderness  Lumbar Spine Exam  Inspection: No masses, redness, or swelling Alignment: Symmetrical ROM: Functional: Limited ROM Active: Decreased ROM Stability: No instability detected Muscle strength & Tone: Functionally intact Sensory: Unimpaired Palpation: Tender Provocative Tests: Lumbar Hyperextension and rotation test: Positive for bilateral lumbar facet pain. Patrick's Maneuver: deferred  Gait & Posture Assessment  Gait: Patient ambulates using a walker. This is rather unclear to me since it would seem that he can't ambulate with a cane. Posture: Difficulty with positional  changes  Lower Extremity Exam    Side: Right lower extremity  Side: Left lower extremity  Inspection: No masses, redness, swelling, or asymmetry ROM:  Inspection: No masses, redness, swelling, or asymmetry ROM:  Functional: Adequate ROM  Functional: Adequate ROM  Active:  Unrestricted ROM  Active: Unrestricted ROM  Muscle strength & Tone: Functionally intact  Muscle strength & Tone: Functionally intact  Sensory: Unimpaired  Sensory: Unimpaired  Palpation: Non-contributory  Palpation: Non-contributory   Assessment & Plan  Primary Diagnosis & Pertinent Problem List: The primary encounter diagnosis was Chronic hip pain (Location of Primary Source of Pain) (Right). Diagnoses of Primary osteoarthritis of right hip, Primary osteoarthritis of both hips, Lumbar spondylosis, unspecified spinal osteoarthritis, Lumbar facet arthropathy, Chronic low back pain (Location of Secondary source of pain) (Bilateral) (R>L), Lumbar facet syndrome (Location of Primary Source of Pain) (Bilateral) (R>L), Chronic pain, Neuropathic pain, Encounter for therapeutic drug level monitoring, and Long term current use of opiate analgesic were also pertinent to this visit.  Visit Diagnosis: 1. Chronic hip pain (Location of Primary Source of Pain) (Right)   2. Primary osteoarthritis of right hip   3. Primary osteoarthritis of both hips   4. Lumbar spondylosis, unspecified spinal osteoarthritis   5. Lumbar facet arthropathy   6. Chronic low back pain (Location of Secondary source of pain) (Bilateral) (R>L)   7. Lumbar facet syndrome (Location of Primary Source of Pain) (Bilateral) (R>L)   8. Chronic pain   9. Neuropathic pain   10. Encounter for therapeutic drug level monitoring   11. 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  Compulsive Tobacco User  Syndrome    Problem-specific Plan(s): No problem-specific assessment & plan notes found for this encounter.  No new assessment & plan notes have been filed under this hospital service since the last note was generated. Service: Pain Management   Plan of Care   Problem List Items Addressed This Visit      High    osteoarthritis of hip (Right) (Chronic)   Relevant Medications   oxyCODONE-acetaminophen (PERCOCET) 10-325 MG tablet   Chronic hip pain (Location of Primary Source of Pain) (Right) - Primary (Chronic)   Chronic low back pain (Location of Secondary source of pain) (Bilateral) (R>L) (Chronic)   Relevant Medications   oxyCODONE-acetaminophen (PERCOCET) 10-325 MG tablet   Other Relevant Orders   LUMBAR FACET(MEDIAL BRANCH NERVE BLOCK) MBNB   Chronic pain (Chronic)   Relevant Medications   oxyCODONE-acetaminophen (PERCOCET) 10-325 MG tablet   gabapentin (NEURONTIN) 300 MG capsule   Lumbar facet arthropathy (Chronic)   Relevant Orders   LUMBAR FACET(MEDIAL BRANCH NERVE BLOCK) MBNB   Lumbar facet syndrome (Location of Primary Source of Pain) (Bilateral) (R>L) (Chronic)   Relevant Medications   oxyCODONE-acetaminophen (PERCOCET) 10-325 MG tablet   Lumbar spondylosis (Chronic)   Relevant Medications   oxyCODONE-acetaminophen (PERCOCET) 10-325 MG tablet   Other Relevant Orders   LUMBAR FACET(MEDIAL BRANCH NERVE BLOCK) MBNB   Neuropathic pain (Chronic)   Relevant Medications   gabapentin (NEURONTIN) 300 MG capsule   Osteoarthritis of hips (Bilateral) (Chronic)   Relevant Medications   oxyCODONE-acetaminophen (PERCOCET) 10-325 MG tablet     Medium   Encounter for therapeutic drug level monitoring   Long term current use of opiate analgesic (Chronic)   Relevant Orders   ToxASSURE Select 13 (MW), Urine       Pharmacotherapy (Medications Ordered): Meds ordered this encounter  Medications  . oxyCODONE-acetaminophen (PERCOCET) 10-325 MG tablet    Sig: Take 1 tablet by  mouth daily as needed for pain.    Dispense:  30 tablet  Refill:  0    Do not place this medication, or any other prescription from our practice, on "Automatic Refill". Patient may have prescription filled one day early if pharmacy is closed on scheduled refill date. Do not fill until: 02/19/16 To last until: 03/20/16  . gabapentin (NEURONTIN) 300 MG capsule    Sig: Take 3 capsules (900 mg total) by mouth 3 (three) times daily. Follow titration schedule.    Dispense:  810 capsule    Refill:  0    This is a 90 day refill to be mailed out.    Lab-work & Procedure Ordered: Orders Placed This Encounter  Procedures  . LUMBAR FACET(MEDIAL BRANCH NERVE BLOCK) MBNB  . ToxASSURE Select 13 (MW), Urine    Imaging Ordered: None  Interventional Therapies: Scheduled:  Diagnostic bilateral lumbar facet block under fluoroscopic guidance and IV sedation.    Considering:  Possible lumbar facet radiofrequency ablation.    PRN Procedures:  None at this time.    Referral(s) or Consult(s): None at this time.  New Prescriptions   No medications on file    Medications administered during this visit: Mr. Oba had no medications administered during this visit.  Requested PM Follow-up: Return in about 3 weeks (around 02/29/2016) for Procedure (ASAP), Medication Management, (1-Mo).  Future Appointments Date Time Provider [place]  02/16/2016 9:40 AM Milinda Pointer, MD [facility] None  02/29/2016 9:40 AM Milinda Pointer, MD [facility] None  04/06/2016 8:30 AM Hollice Espy, MD [facility] None    Primary Care Physician: Tracie Harrier, MD Location: [facility] Note by: Kathlen Brunswick. Dossie Arbour, M.D, DABA, DABAPM, DABPM, DABIPP, FIPP  Pain Score Disclaimer: We use the NRS-11 scale. This is a self-reported, subjective measurement of pain severity with only modest accuracy. It is used primarily to identify changes within a particular patient. It must be  understood that outpatient pain scales are significantly less accurate that those used for research, where they can be applied under ideal controlled circumstances with minimal exposure to variables. In reality, the score is likely to be a combination of pain intensity and pain affect, where pain affect describes the degree of emotional arousal or changes in action readiness caused by the sensory experience of pain. Factors such as social and work situation, setting, emotional state, anxiety levels, expectation, and prior pain experience may influence pain perception and show large inter-individual differences that may also be affected by time variables.  Patient instructions provided during this appointment: Patient Instructions  Prescription given for oxycodone 10mg  to last until 03-20-2016  Facet Blocks Patient Information  Description: The facets are joints in the spine between the vertebrae.  Like any joints in the body, facets can become irritated and painful.  Arthritis can also effect the facets.  By injecting steroids and local anesthetic in and around these joints, we can temporarily block the nerve supply to them.  Steroids act directly on irritated nerves and tissues to reduce selling and inflammation which often leads to decreased pain.  Facet blocks may be done anywhere along the spine from the neck to the low back depending upon the location of your pain.   After numbing the skin with local anesthetic (like Novocaine), a small needle is passed onto the facet joints under x-ray guidance.  You may experience a sensation of pressure while this is being done.  The entire block usually lasts about 15-25 minutes.   Conditions which may be treated by facet blocks:   Low back/buttock pain  Neck/shoulder  pain  Certain types of headaches  Preparation for the injection:  1. Do not eat any solid food or dairy products within 8 hours of your appointment. 2. You may drink clear liquid up to 3  hours before appointment.  Clear liquids include water, black coffee, juice or soda.  No milk or cream please. 3. You may take your regular medication, including pain medications, with a sip of water before your appointment.  Diabetics should hold regular insulin (if taken separately) and take 1/2 normal NPH dose the morning of the procedure.  Carry some sugar containing items with you to your appointment. 4. A driver must accompany you and be prepared to drive you home after your procedure. 5. Bring all your current medications with you. 6. An IV may be inserted and sedation may be given at the discretion of the physician. 7. A blood pressure cuff, EKG and other monitors will often be applied during the procedure.  Some patients may need to have extra oxygen administered for a short period. 8. You will be asked to provide medical information, including your allergies and medications, prior to the procedure.  We must know immediately if you are taking blood thinners (like Coumadin/Warfarin) or if you are allergic to IV iodine contrast (dye).  We must know if you could possible be pregnant.  Possible side-effects:   Bleeding from needle site  Infection (rare, may require surgery)  Nerve injury (rare)  Numbness & tingling (temporary)  Difficulty urinating (rare, temporary)  Spinal headache (a headache worse with upright posture)  Light-headedness (temporary)  Pain at injection site (serveral days)  Decreased blood pressure (rare, temporary)  Weakness in arm/leg (temporary)  Pressure sensation in back/neck (temporary)   Call if you experience:   Fever/chills associated with headache or increased back/neck pain  Headache worsened by an upright position  New onset, weakness or numbness of an extremity below the injection site  Hives or difficulty breathing (go to the emergency room)  Inflammation or drainage at the injection site(s)  Severe back/neck pain greater than  usual  New symptoms which are concerning to you  Please note:  Although the local anesthetic injected can often make your back or neck feel good for several hours after the injection, the pain will likely return. It takes 3-7 days for steroids to work.  You may not notice any pain relief for at least one week.  If effective, we will often do a series of 2-3 injections spaced 3-6 weeks apart to maximally decrease your pain.  After the initial series, you may be a candidate for a more permanent nerve block of the facets.  If you have any questions, please call #[phone]) [facility]

## 2016-02-10 ENCOUNTER — Encounter: Payer: Self-pay | Admitting: Pain Medicine

## 2016-02-15 LAB — TOXASSURE SELECT 13 (MW), URINE

## 2016-02-16 ENCOUNTER — Ambulatory Visit: Payer: BLUE CROSS/BLUE SHIELD | Attending: Pain Medicine | Admitting: Pain Medicine

## 2016-02-16 ENCOUNTER — Encounter: Payer: Self-pay | Admitting: Pain Medicine

## 2016-02-16 VITALS — BP 112/77 | HR 96 | Temp 97.1°F | Resp 15 | Ht 68.0 in | Wt 150.0 lb

## 2016-02-16 DIAGNOSIS — E785 Hyperlipidemia, unspecified: Secondary | ICD-10-CM | POA: Diagnosis not present

## 2016-02-16 DIAGNOSIS — M47892 Other spondylosis, cervical region: Secondary | ICD-10-CM | POA: Insufficient documentation

## 2016-02-16 DIAGNOSIS — N529 Male erectile dysfunction, unspecified: Secondary | ICD-10-CM | POA: Diagnosis not present

## 2016-02-16 DIAGNOSIS — M545 Low back pain: Secondary | ICD-10-CM | POA: Diagnosis not present

## 2016-02-16 DIAGNOSIS — G8929 Other chronic pain: Secondary | ICD-10-CM | POA: Diagnosis not present

## 2016-02-16 DIAGNOSIS — M4806 Spinal stenosis, lumbar region: Secondary | ICD-10-CM | POA: Insufficient documentation

## 2016-02-16 DIAGNOSIS — M47896 Other spondylosis, lumbar region: Secondary | ICD-10-CM | POA: Insufficient documentation

## 2016-02-16 DIAGNOSIS — R270 Ataxia, unspecified: Secondary | ICD-10-CM | POA: Insufficient documentation

## 2016-02-16 DIAGNOSIS — J449 Chronic obstructive pulmonary disease, unspecified: Secondary | ICD-10-CM | POA: Diagnosis not present

## 2016-02-16 DIAGNOSIS — Z8673 Personal history of transient ischemic attack (TIA), and cerebral infarction without residual deficits: Secondary | ICD-10-CM | POA: Diagnosis not present

## 2016-02-16 DIAGNOSIS — F17210 Nicotine dependence, cigarettes, uncomplicated: Secondary | ICD-10-CM | POA: Diagnosis not present

## 2016-02-16 DIAGNOSIS — M25552 Pain in left hip: Secondary | ICD-10-CM | POA: Diagnosis not present

## 2016-02-16 DIAGNOSIS — Z79891 Long term (current) use of opiate analgesic: Secondary | ICD-10-CM | POA: Insufficient documentation

## 2016-02-16 DIAGNOSIS — R262 Difficulty in walking, not elsewhere classified: Secondary | ICD-10-CM | POA: Diagnosis not present

## 2016-02-16 DIAGNOSIS — R51 Headache: Secondary | ICD-10-CM | POA: Insufficient documentation

## 2016-02-16 DIAGNOSIS — F411 Generalized anxiety disorder: Secondary | ICD-10-CM | POA: Diagnosis not present

## 2016-02-16 DIAGNOSIS — M25559 Pain in unspecified hip: Secondary | ICD-10-CM | POA: Diagnosis present

## 2016-02-16 DIAGNOSIS — M79604 Pain in right leg: Secondary | ICD-10-CM | POA: Diagnosis not present

## 2016-02-16 DIAGNOSIS — M160 Bilateral primary osteoarthritis of hip: Secondary | ICD-10-CM | POA: Insufficient documentation

## 2016-02-16 DIAGNOSIS — R200 Anesthesia of skin: Secondary | ICD-10-CM | POA: Insufficient documentation

## 2016-02-16 DIAGNOSIS — M47816 Spondylosis without myelopathy or radiculopathy, lumbar region: Secondary | ICD-10-CM | POA: Diagnosis not present

## 2016-02-16 DIAGNOSIS — E7800 Pure hypercholesterolemia, unspecified: Secondary | ICD-10-CM | POA: Diagnosis not present

## 2016-02-16 DIAGNOSIS — M25551 Pain in right hip: Secondary | ICD-10-CM | POA: Diagnosis not present

## 2016-02-16 DIAGNOSIS — M542 Cervicalgia: Secondary | ICD-10-CM | POA: Insufficient documentation

## 2016-02-16 DIAGNOSIS — Z9181 History of falling: Secondary | ICD-10-CM | POA: Insufficient documentation

## 2016-02-16 DIAGNOSIS — Z8546 Personal history of malignant neoplasm of prostate: Secondary | ICD-10-CM | POA: Insufficient documentation

## 2016-02-16 MED ORDER — LIDOCAINE HCL (PF) 1 % IJ SOLN: 10.0000 mL | Freq: Once | INTRAMUSCULAR | Status: DC

## 2016-02-16 MED ORDER — FENTANYL CITRATE (PF) 100 MCG/2ML IJ SOLN
25.0000 ug | INTRAMUSCULAR | Status: DC | PRN
Start: 2016-02-16 — End: 2016-02-29
  Filled 2016-02-16: qty 2

## 2016-02-16 MED ORDER — LACTATED RINGERS IV SOLN: 1000.0000 mL | Freq: Once | INTRAVENOUS | Status: DC

## 2016-02-16 MED ORDER — ROPIVACAINE HCL 2 MG/ML IJ SOLN
9.0000 mL | Freq: Once | INTRAMUSCULAR | Status: AC
  Administered 2016-02-16: 9 mL via EPIDURAL
  Filled 2016-02-16: qty 10

## 2016-02-16 MED ORDER — TRIAMCINOLONE ACETONIDE 40 MG/ML IJ SUSP
40.0000 mg | Freq: Once | INTRAMUSCULAR | Status: AC
  Administered 2016-02-16: 40 mg

## 2016-02-16 MED ORDER — MIDAZOLAM HCL 5 MG/5ML IJ SOLN
1.0000 mg | INTRAMUSCULAR | Status: DC | PRN
  Filled 2016-02-16: qty 5

## 2016-02-16 NOTE — Progress Notes (Signed)
Safety precautions to be maintained throughout the outpatient stay will include: orient to surroundings, keep bed in low position, maintain call bell within reach at all times, provide assistance with transfer out of bed and ambulation.  

## 2016-02-16 NOTE — Patient Instructions (Signed)
Pain Management Discharge Instructions  General Discharge Instructions :  If you need to reach your doctor call: Monday-Friday 8:00 am - 4:00 pm at [phone] or toll free [phone].  After clinic hours [phone] to have operator reach doctor.  Bring all of your medication bottles to all your appointments in the pain clinic.  To cancel or reschedule your appointment with Pain Management please remember to call 24 hours in advance to avoid a fee.  Refer to the educational materials which you have been given on: General Risks, I had my Procedure. Discharge Instructions, Post Sedation.  Post Procedure Instructions:  The drugs you were given will stay in your system until tomorrow, so for the next 24 hours you should not drive, make any legal decisions or drink any alcoholic beverages.  You may eat anything you prefer, but it is better to start with liquids then soups and crackers, and gradually work up to solid foods.  Please notify your doctor immediately if you have any unusual bleeding, trouble breathing or pain that is not related to your normal pain.  Depending on the type of procedure that was done, some parts of your body may feel week and/or numb.  This usually clears up by tonight or the next day.  Walk with the use of an assistive device or accompanied by an adult for the 24 hours.  You may use ice on the affected area for the first 24 hours.  Put ice in a Ziploc bag and cover with a towel and place against area 15 minutes on 15 minutes off.  You may switch to heat after 24 hours.GENERAL RISKS AND COMPLICATIONS  What are the risk, side effects and possible complications? Generally speaking, most procedures are safe.  However, with any procedure there are risks, side effects, and the possibility of complications.  The risks and complications are dependent upon the sites that are lesioned, or the type of nerve block to be performed.  The closer the procedure is to the spine,  the more serious the risks are.  Great care is taken when placing the radio frequency needles, block needles or lesioning probes, but sometimes complications can occur. 1. Infection: Any time there is an injection through the skin, there is a risk of infection.  This is why sterile conditions are used for these blocks.  There are four possible types of infection. 1. Localized skin infection. 2. Central Nervous System Infection-This can be in the form of Meningitis, which can be deadly. 3. Epidural Infections-This can be in the form of an epidural abscess, which can cause pressure inside of the spine, causing compression of the spinal cord with subsequent paralysis. This would require an emergency surgery to decompress, and there are no guarantees that the patient would recover from the paralysis. 4. Discitis-This is an infection of the intervertebral discs.  It occurs in about 1% of discography procedures.  It is difficult to treat and it may lead to surgery.        2. Pain: the needles have to go through skin and soft tissues, will cause soreness.       3. Damage to internal structures:  The nerves to be lesioned may be near blood vessels or    other nerves which can be potentially damaged.       4. Bleeding: Bleeding is more common if the patient is taking blood thinners such as  aspirin, Coumadin, Ticiid, Plavix, etc., or if he/she have some genetic predisposition  such as   hemophilia. Bleeding into the spinal canal can cause compression of the spinal  cord with subsequent paralysis.  This would require an emergency surgery to  decompress and there are no guarantees that the patient would recover from the  paralysis.       5. Pneumothorax:  Puncturing of a lung is a possibility, every time a needle is introduced in  the area of the chest or upper back.  Pneumothorax refers to free air around the  collapsed lung(s), inside of the thoracic cavity (chest cavity).  Another two possible  complications  related to a similar event would include: Hemothorax and Chylothorax.   These are variations of the Pneumothorax, where instead of air around the collapsed  lung(s), you may have blood or chyle, respectively.       6. Spinal headaches: They may occur with any procedures in the area of the spine.       7. Persistent CSF (Cerebro-Spinal Fluid) leakage: This is a rare problem, but may occur  with prolonged intrathecal or epidural catheters either due to the formation of a fistulous  track or a dural tear.       8. Nerve damage: By working so close to the spinal cord, there is always a possibility of  nerve damage, which could be as serious as a permanent spinal cord injury with  paralysis.       9. Death:  Although rare, severe deadly allergic reactions known as "Anaphylactic  reaction" can occur to any of the medications used.      10. Worsening of the symptoms:  We can always make thing worse.  What are the chances of something like this happening? Chances of any of this occuring are extremely low.  By statistics, you have more of a chance of getting killed in a motor vehicle accident: while driving to the hospital than any of the above occurring .  Nevertheless, you should be aware that they are possibilities.  In general, it is similar to taking a shower.  Everybody knows that you can slip, hit your head and get killed.  Does that mean that you should not shower again?  Nevertheless always keep in mind that statistics do not mean anything if you happen to be on the wrong side of them.  Even if a procedure has a 1 (one) in a 1,000,000 (million) chance of going wrong, it you happen to be that one..Also, keep in mind that by statistics, you have more of a chance of having something go wrong when taking medications.  Who should not have this procedure? If you are on a blood thinning medication (e.g. Coumadin, Plavix, see list of "Blood Thinners"), or if you have an active infection going on, you should not  have the procedure.  If you are taking any blood thinners, please inform your physician.  How should I prepare for this procedure?  Do not eat or drink anything at least six hours prior to the procedure.  Bring a driver with you .  It cannot be a taxi.  Come accompanied by an adult that can drive you back, and that is strong enough to help you if your legs get weak or numb from the local anesthetic.  Take all of your medicines the morning of the procedure with just enough water to swallow them.  If you have diabetes, make sure that you are scheduled to have your procedure done first thing in the morning, whenever possible.  If you have diabetes,   take only half of your insulin dose and notify our nurse that you have done so as soon as you arrive at the clinic.  If you are diabetic, but only take blood sugar pills (oral hypoglycemic), then do not take them on the morning of your procedure.  You may take them after you have had the procedure.  Do not take aspirin or any aspirin-containing medications, at least eleven (11) days prior to the procedure.  They may prolong bleeding.  Wear loose fitting clothing that may be easy to take off and that you would not mind if it got stained with Betadine or blood.  Do not wear any jewelry or perfume  Remove any nail coloring.  It will interfere with some of our monitoring equipment.  NOTE: Remember that this is not meant to be interpreted as a complete list of all possible complications.  Unforeseen problems may occur.  BLOOD THINNERS The following drugs contain aspirin or other products, which can cause increased bleeding during surgery and should not be taken for 2 weeks prior to and 1 week after surgery.  If you should need take something for relief of minor pain, you may take acetaminophen which is found in Tylenol,m Datril, Anacin-3 and Panadol. It is not blood thinner. The products listed below are.  Do not take any of the products listed below  in addition to any listed on your instruction sheet.  A.P.C or A.P.C with Codeine Codeine Phosphate Capsules #3 Ibuprofen Ridaura  ABC compound Congesprin Imuran rimadil  Advil Cope Indocin Robaxisal  Alka-Seltzer Effervescent Pain Reliever and Antacid Coricidin or Coricidin-D  Indomethacin Rufen  Alka-Seltzer plus Cold Medicine Cosprin Ketoprofen S-A-C Tablets  Anacin Analgesic Tablets or Capsules Coumadin Korlgesic Salflex  Anacin Extra Strength Analgesic tablets or capsules CP-2 Tablets Lanoril Salicylate  Anaprox Cuprimine Capsules Levenox Salocol  Anexsia-D Dalteparin Magan Salsalate  Anodynos Darvon compound Magnesium Salicylate Sine-off  Ansaid Dasin Capsules Magsal Sodium Salicylate  Anturane Depen Capsules Marnal Soma  APF Arthritis pain formula Dewitt's Pills Measurin Stanback  Argesic Dia-Gesic Meclofenamic Sulfinpyrazone  Arthritis Bayer Timed Release Aspirin Diclofenac Meclomen Sulindac  Arthritis pain formula Anacin Dicumarol Medipren Supac  Analgesic (Safety coated) Arthralgen Diffunasal Mefanamic Suprofen  Arthritis Strength Bufferin Dihydrocodeine Mepro Compound Suprol  Arthropan liquid Dopirydamole Methcarbomol with Aspirin Synalgos  ASA tablets/Enseals Disalcid Micrainin Tagament  Ascriptin Doan's Midol Talwin  Ascriptin A/D Dolene Mobidin Tanderil  Ascriptin Extra Strength Dolobid Moblgesic Ticlid  Ascriptin with Codeine Doloprin or Doloprin with Codeine Momentum Tolectin  Asperbuf Duoprin Mono-gesic Trendar  Aspergum Duradyne Motrin or Motrin IB Triminicin  Aspirin plain, buffered or enteric coated Durasal Myochrisine Trigesic  Aspirin Suppositories Easprin Nalfon Trillsate  Aspirin with Codeine Ecotrin Regular or Extra Strength Naprosyn Uracel  Atromid-S Efficin Naproxen Ursinus  Auranofin Capsules Elmiron Neocylate Vanquish  Axotal Emagrin Norgesic Verin  Azathioprine Empirin or Empirin with Codeine Normiflo Vitamin E  Azolid Emprazil Nuprin Voltaren  Bayer  Aspirin plain, buffered or children's or timed BC Tablets or powders Encaprin Orgaran Warfarin Sodium  Buff-a-Comp Enoxaparin Orudis Zorpin  Buff-a-Comp with Codeine Equegesic Os-Cal-Gesic   Buffaprin Excedrin plain, buffered or Extra Strength Oxalid   Bufferin Arthritis Strength Feldene Oxphenbutazone   Bufferin plain or Extra Strength Feldene Capsules Oxycodone with Aspirin   Bufferin with Codeine Fenoprofen Fenoprofen Pabalate or Pabalate-SF   Buffets II Flogesic Panagesic   Buffinol plain or Extra Strength Florinal or Florinal with Codeine Panwarfarin   Buf-Tabs Flurbiprofen Penicillamine   Butalbital Compound Four-way cold tablets   Penicillin   Butazolidin Fragmin Pepto-Bismol   Carbenicillin Geminisyn Percodan   Carna Arthritis Reliever Geopen Persantine   Carprofen Gold's salt Persistin   Chloramphenicol Goody's Phenylbutazone   Chloromycetin Haltrain Piroxlcam   Clmetidine heparin Plaquenil   Cllnoril Hyco-pap Ponstel   Clofibrate Hydroxy chloroquine Propoxyphen         Before stopping any of these medications, be sure to consult the physician who ordered them.  Some, such as Coumadin (Warfarin) are ordered to prevent or treat serious conditions such as "deep thrombosis", "pumonary embolisms", and other heart problems.  The amount of time that you may need off of the medication may also vary with the medication and the reason for which you were taking it.  If you are taking any of these medications, please make sure you notify your pain physician before you undergo any procedures.         Facet Joint Block, Care After Refer to this sheet in the next few weeks. These instructions provide you with information on caring for yourself after your procedure. Your health care provider may also give you more specific instructions. Your treatment has been planned according to current medical practices, but problems sometimes occur. Call your health care provider if you have any problems  or questions after your procedure. HOME CARE INSTRUCTIONS  2. Keep track of the amount of pain relief you feel and how long it lasts. 3. Limit pain medicine within the first 4-6 hours after the procedure as directed by your health care provider. 4. Resume taking dietary supplements and medicines as directed by your health care provider. 5. You may resume your regular diet. 6. Do not apply heat near or over the injection site(s) for 24 hours.  7. Do not take a bath or soak in water (such as a pool or lake) for 24 hours. 8. Do not drive for 24 hours unless approved by your health care provider. 9. Avoid strenuous activity for 24 hours. 10. Remove your bandages the morning after the procedure.  11. If the injection site is tender, applying an ice pack may relieve some tenderness. To do this: 1. Put ice in a bag. 2. Place a towel between your skin and the bag. 3. Leave the ice on for 15-20 minutes, 3-4 times a day. 12. Keep follow-up appointments as directed by your health care provider. SEEK MEDICAL CARE IF:   Your pain is not controlled by your medicines.   There is drainage from the injection site.   There is significant bleeding or swelling at the injection site.  You have diabetes and your blood sugar is above 180 mg/dL. SEEK IMMEDIATE MEDICAL CARE IF:   You develop a fever of 101F (38.3C) or greater.   You have worsening pain or swelling around the injection site.   You have red streaking around the injection site.   You develop severe pain that is not controlled by your medicines.   You develop a headache, stiff neck, nausea, or vomiting.   Your eyes become very sensitive to light.   You have weakness, paralysis, or tingling in your arms or legs that was not present before the procedure.   You develop difficulty urinating or breathing.    This information is not intended to replace advice given to you by your health care provider. Make sure you discuss any  questions you have with your health care provider.   Document Released: 08/29/2012 Document Revised: 10/03/2014 Document Reviewed: 08/29/2012 Elsevier Interactive Patient Education 2016   [employer] Facet Joint Block The facet joints connect the bones of the spine (vertebrae). They make it possible for you to bend, twist, and make other movements with your spine. They also prevent you from overbending, overtwisting, and making other excessive movements.  A facet joint block is a procedure where a numbing medicine (anesthetic) is injected into a facet joint. Often, a type of anti-inflammatory medicine called a steroid is also injected. A facet joint block may be done for two reasons:  13. Diagnosis. A facet joint block may be done as a test to see whether neck or back pain is caused by a worn-down or infected facet joint. If the pain gets better after a facet joint block, it means the pain is probably coming from the facet joint. If the pain does not get better, it means the pain is probably not coming from the facet joint.  14. Therapy. A facet joint block may be done to relieve neck or back pain caused by a facet joint. A facet joint block is only done as a therapy if the pain does not improve with medicine, exercise programs, physical therapy, and other forms of pain management. LET YOUR HEALTH CARE PROVIDER KNOW ABOUT:   Any allergies you have.   All medicines you are taking, including vitamins, herbs, eyedrops, and over-the-counter medicines and creams.   Previous problems you or members of your family have had with the use of anesthetics.   Any blood disorders you have had.   Other health problems you have. RISKS AND COMPLICATIONS Generally, having a facet joint block is safe. However, as with any procedure, complications can occur. Possible complications associated with having a facet joint block include:   Bleeding.   Injury to a nerve near the injection site.   Pain at the  injection site.   Weakness or numbness in areas controlled by nerves near the injection site.   Infection.   Temporary fluid retention.   Allergic reaction to anesthetics or medicines used during the procedure. BEFORE THE PROCEDURE   Follow your health care provider's instructions if you are taking dietary supplements or medicines. You may need to stop taking them or reduce your dosage.   Do not take any new dietary supplements or medicines without asking your health care provider first.   Follow your health care provider's instructions about eating and drinking before the procedure. You may need to stop eating and drinking several hours before the procedure.   Arrange to have an adult drive you home after the procedure. PROCEDURE 12. You may need to remove your clothing and dress in an open-back gown so that your health care provider can access your spine.  13. The procedure will be done while you are lying on an X-ray table. Most of the time you will be asked to lie on your stomach, but you may be asked to lie in a different position if an injection will be made in your neck.  14. Special machines will be used to monitor your oxygen levels, heart rate, and blood pressure.  15. If an injection will be made in your neck, an intravenous (IV) tube will be inserted into one of your veins. Fluids and medicine will flow directly into your body through the IV tube.  16. The area over the facet joint where the injection will be made will be cleaned with an antiseptic soap. The surrounding skin will be covered with sterile drapes.  17. An anesthetic will be applied to   your skin to make the injection area numb. You may feel a temporary stinging or burning sensation.  18. A video X-ray machine will be used to locate the joint. A contrast dye may be injected into the facet joint area to help with locating the joint.  19. When the joint is located, an anesthetic medicine will be injected  into the joint through the needle.  20. Your health care provider will ask you whether you feel pain relief. If you do feel relief, a steroid may be injected to provide pain relief for a longer period of time. If you do not feel relief or feel only partial relief, additional injections of an anesthetic may be made in other facet joints.  21. The needle will be removed, the skin will be cleansed, and bandages will be applied.  AFTER THE PROCEDURE   You will be observed for 15-30 minutes before being allowed to go home. Do not drive. Have an adult drive you or take a taxi or public transportation instead.   If you feel pain relief, the pain will return in several hours or days when the anesthetic wears off.   You may feel pain relief 2-14 days after the procedure. The amount of time this relief lasts varies from person to person.   It is normal to feel some tenderness over the injected area(s) for 2 days following the procedure.   If you have diabetes, you may have a temporary increase in blood sugar.   This information is not intended to replace advice given to you by your health care provider. Make sure you discuss any questions you have with your health care provider.   Document Released: 02/01/2007 Document Revised: 10/03/2014 Document Reviewed: 07/02/2012 Elsevier Interactive Patient Education 2016 [employer]  

## 2016-02-16 NOTE — Progress Notes (Signed)
1049 Versed 2 mg IV, Fentanyl 50 mcg IV 1052 Versed 2 mg IV, Fentanyl 50 mcg IV

## 2016-02-16 NOTE — Progress Notes (Signed)
Patient's Name: Mark Hodges  Patient type: Established  MRN: [id number]  Service setting: Ambulatory outpatient  DOB: Jun 23, 1956  Location: [facility]  DOS: 02/16/2016  Primary Care Physician: Tracie Harrier, MD  Note by: Kathlen Brunswick. Dossie Arbour, M.D, DABA, DABAPM, DABPM, Milagros Evener, FIPP  Referring Physician: Tracie Harrier, MD  Specialty: Board-Certified Interventional Pain Management  Last Visit to Pain Management: 02/08/2016   Primary Reason(s) for Visit: Interventional Pain Management Treatment. CC: Hip Pain  Primary Diagnosis: Facet syndrome, lumbar [M54.5]   Procedure:  Anesthesia, Analgesia, Anxiolysis:  Type: Diagnostic Medial Branch Facet Block Region: Lumbar Level: L2, L3, L4, L5, & S1 Medial Branch Level(s) Laterality: Bilateral  Indications: 1. Lumbar facet syndrome (Location of Primary Source of Pain) (Bilateral) (R>L)   2. Lumbar spondylosis, unspecified spinal osteoarthritis   3. Chronic low back pain (Location of Secondary source of pain) (Bilateral) (R>L)     Pre-procedure Pain Score: 5/10 Reported level of pain is compatible with clinical observations Post-procedure Pain Score: 4   Type: Moderate (Conscious) Sedation & Local Anesthesia Local Anesthetic: Lidocaine 1% Route: Intravenous (IV) IV Access: Secured Sedation: Meaningful verbal contact was maintained at all times during the procedure  Indication(s): Analgesia & Anxiolysis   Pre-Procedure Assessment:  Mr. Cerro is a 60 y.o. year old, male patient, seen today for interventional treatment. He has Difficulty in walking; Compulsive tobacco user syndrome; Hypercholesterolemia without hypertriglyceridemia; Long term current use of opiate analgesic; Long term prescription opiate use; Opiate use; Opiate dependence ([place]); Encounter for therapeutic drug level monitoring; Chronic pain; Chronic low back pain (Location of Secondary source of pain) (Bilateral) (R>L); Chronic lumbar radicular  pain (Polyradiculopathy) (Right); Ataxia; HLD (hyperlipidemia); Current tobacco use; Pure hypercholesterolemia; Arm numbness; Substance use disorder Risk: LOW; Chronic upper back pain; Cervical spondylosis; Chronic neck pain; Cervicogenic headache; Chronic radicular cervical pain; Chronic hip pain (Location of Primary Source of Pain) (Right); Generalized anxiety disorder; Depression; At high risk for falls; Erectile dysfunction; History of TIA (transient ischemic attack); 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 His primarily concern today is the Hip Pain   Pain Type: Chronic pain Pain Location: Hip Pain Orientation: Left Pain Descriptors / Indicators: Aching, Sharp Pain Frequency: Constant  Date of Last Visit: 02/08/16 Service Provided on Last Visit: Med Refill  Verification of the correct person, correct site (including marking of site), and correct procedure were performed and confirmed by the patient.  Consent: Secured. Under the influence of no sedatives a written informed consent was obtained, after having provided information on the risks and possible complications. To fulfill our ethical and legal obligations, as recommended by the American Medical Association's Code of Ethics, we have provided information to the patient about our clinical impression; the nature and purpose of the treatment or procedure;  the risks, benefits, and possible complications of the intervention; alternatives; the risk(s) and benefit(s) of the alternative treatment(s) or procedure(s); and the risk(s) and benefit(s) of doing nothing. The patient was provided information about the risks and possible complications associated with the procedure. These include,  but are not limited to, failure to achieve desired goals, infection, bleeding, organ or nerve damage, allergic reactions, paralysis, and death. In the case of spinal procedures these may include, but are not limited to, failure to achieve desired goals, infection, bleeding, organ or nerve damage, allergic reactions, paralysis, and death. In addition, the patient was informed that Medicine is not an exact science; therefore, there is also the possibility of unforeseen risks and possible complications that may result in a catastrophic outcome. The patient indicated having understood very clearly. We have given the patient no guarantees and we have made no promises. Enough time was given to the patient to ask questions, all of which were answered to the patient's satisfaction.  Consent Attestation: I, the ordering provider, attest that I have discussed with the patient the benefits, risks, side-effects, alternatives, likelihood of achieving goals, and potential problems during recovery for the procedure that I have provided informed consent.  Pre-Procedure Preparation: Safety Precautions: Allergies reviewed. Appropriate site, procedure, and patient were confirmed by following the Joint Commission's Universal Protocol (UP.01.01.01), in the form of a "Time Out". The patient was asked to confirm marked site and procedure, before commencing. The patient was asked about blood thinners, or active infections, both of which were denied. Patient was assessed for positional comfort and all pressure points were checked before starting procedure. Allergies: He has No Known Allergies.. Infection  Control Precautions: Sterile technique used. Standard Universal Precautions were taken as recommended by the Department of [facility] for Disease Control and Prevention (CDC). Standard pre-surgical skin prep was conducted. Respiratory hygiene and cough etiquette was practiced. Hand hygiene observed. Safe injection practices and needle disposal techniques followed. SDV (single dose vial) medications used. Medications properly checked for expiration dates and contaminants. Personal protective equipment (PPE) used: Sterile Radiation-resistant gloves. Monitoring:  As per clinic protocol. Filed Vitals:   02/16/16 1112 02/16/16 1122 02/16/16 1132 02/16/16 1136  BP: 112/78 101/70 111/73 112/77  Pulse: 92 99 95 96  Temp: 97.1 F (36.2 C)     Resp: _0 Height:      Weight:      SpO2: 95% 97% 97% 96%  Calculated BMI: Body mass index is 22.81 kg/(m^2).  Description of Procedure Process:   Time-out: "Time-out" completed before starting procedure, as per protocol. Position: Prone Target Area: For Lumbar Facet blocks, the target is the groove formed by the junction of the transverse process and superior articular process. For the L5 dorsal ramus, the target is the notch between superior articular process and sacral ala. For the S1 dorsal ramus, the target is the superior and lateral edge of the posterior S1 Sacral foramen. Approach: Paramedial approach. Area Prepped: Entire Posterior Lumbosacral Region Prepping solution: ChloraPrep (2% chlorhexidine gluconate and 70% isopropyl alcohol) Safety Precautions: Aspiration looking for blood return was conducted prior to all injections. At no point did we inject any substances, as a needle was being advanced. No attempts were made at seeking any paresthesias. Safe injection practices and needle disposal techniques used. Medications properly checked for expiration dates. SDV (single dose vial) medications used.   Description of the Procedure:  Protocol guidelines were followed. The patient was placed in position over the fluoroscopy table. The target area was identified and the area prepped in the usual manner. Skin desensitized using vapocoolant spray. Skin & deeper tissues infiltrated with local anesthetic. Appropriate amount of time allowed to pass for local anesthetics to take effect. The procedure needle was introduced through the  skin, ipsilateral to the reported pain, and advanced to the target area. Employing the "Medial Branch Technique", the needles were advanced to the angle made by the superior and medial portion of the transverse process, and the lateral and inferior portion of the superior articulating process of the targeted vertebral bodies. This area is known as "Burton's Eye" or the "Eye of the [place] Dog". A procedure needle was introduced through the skin, and this time advanced to the angle made by the superior and medial border of the sacral ala, and the lateral border of the S1 vertebral body. This last needle was later repositioned at the superior and lateral border of the posterior S1 foramen. Negative aspiration confirmed. Solution injected in intermittent fashion, asking for systemic symptoms every 0.5cc of injectate. The needles were then removed and the area cleansed, making sure to leave some of the prepping solution back to take advantage of its long term bactericidal properties. EBL: None Materials & Medications Used:  Needle(s) Used: 22g - 3.5" Spinal Needle(s)  Imaging Guidance:   Type of Imaging Technique: Fluoroscopy Guidance (Spinal) Indication(s): Assistance in needle guidance and placement for procedures requiring needle placement in or near specific anatomical locations not easily accessible without such assistance. Exposure Time: Please see nurses notes. Contrast: None required. Fluoroscopic Guidance: I was personally present in the fluoroscopy suite, where the patient was placed in position for the  procedure, over the fluoroscopy-compatible table. Fluoroscopy was manipulated, using "Tunnel Vision Technique", to obtain the best possible view of the target area, on the affected side. Parallax error was corrected before commencing the procedure. A "direction-depth-direction" technique was used to introduce the needle under continuous pulsed fluoroscopic guidance. Once the target was reached, antero-posterior, oblique, and lateral fluoroscopic projection views were taken to confirm needle placement in all planes. Permanently recorded images stored by scanning into EMR. Interpretation: Intraoperative imaging interpretation by performing Physician. Adequate needle placement confirmed. Adequate needle placement confirmed in AP, lateral, & Oblique Views. No contrast injected.  Antibiotic Prophylaxis:  Indication(s): No indications identified. Type:  Antibiotics Given (last 72 hours)    None       Post-operative Assessment:   Complications: No immediate post-treatment complications were observed. Disposition: Return to clinic for follow-up evaluation. The patient tolerated the entire procedure well. A repeat set of vitals were taken after the procedure and the patient was kept under observation following institutional policy, for this procedure. Post-procedural neurological assessment was performed, showing return to baseline, prior to discharge. The patient was discharged home, once institutional criteria were met. The patient was provided with post-procedure discharge instructions, including a section on how to identify potential problems. Should any problems arise concerning this procedure, the patient was given instructions to immediately contact us, at any time, without hesitation. In any case, we plan to contact the patient by telephone for a follow-up status report regarding this interventional procedure. Comments:  No additional relevant information.  Medications administered during this  visit: We administered triamcinolone acetonide and ropivacaine (PF) 2 mg/ml (0.2%).  Prescriptions ordered during this visit: New Prescriptions   No medications on file    Requested PM Follow-up: Return for Post-Procedure Eval (2 weeks).  Future Appointments Date Time Provider [place]  02/29/2016 9:40 AM Milinda Pointer, MD [facility] None  04/06/2016 8:30 AM Hollice Espy, MD [facility] None    Primary Care Physician: Tracie Harrier, MD Location: [facility] Note by: Kathlen Brunswick. Dossie Arbour, M.D, DABA, DABAPM, DABPM, DABIPP, FIPP   Illustration of the posterior  view of the lumbar spine and the posterior neural structures. Laminae of L2 through S1 are labeled. DPRL5, dorsal primary ramus of L5; DPRS1, dorsal primary ramus of S1; DPR3, dorsal primary ramus of L3; FJ, facet (zygapophyseal) joint L3-L4; I, inferior articular process of L4; LB1, lateral branch of dorsal primary ramus of L1; IAB, inferior articular branches from L3 medial branch (supplies L4-L5 facet joint); IBP, intermediate branch plexus; MB3, medial branch of dorsal primary ramus of L3; NR3, third lumbar nerve root; S, superior articular process of L5; SAB, superior articular branches from L4 (supplies L4-5 facet joint also); TP3, transverse process of L3.  Disclaimer:  Medicine is not an Chief Strategy Officer. The only guarantee in medicine is that nothing is guaranteed. It is important to note that the decision to proceed with this intervention was based on the information collected from the patient. The Data and conclusions were drawn from the patient's questionnaire, the interview, and the physical examination. Because the information was provided in large part by the patient, it cannot be guaranteed that it has not been purposely or unconsciously manipulated. Every effort has been made to obtain as much relevant data as possible for this evaluation. It is important to note that the conclusions  that lead to this procedure are derived in large part from the available data. Always take into account that the treatment will also be dependent on availability of resources and existing treatment guidelines, considered by other Pain Management Practitioners as being common knowledge and practice, at the time of the intervention. For Medico-Legal purposes, it is also important to point out that variation in procedural techniques and pharmacological choices are the acceptable norm. The indications, contraindications, technique, and results of the above procedure should only be interpreted and judged by a Board-Certified Interventional Pain Specialist with extensive familiarity and expertise in the same exact procedure and technique. Attempts at providing opinions without similar or greater experience and expertise than that of the treating physician will be considered as inappropriate and unethical, and shall result in a formal complaint to the state medical board and applicable specialty societies.

## 2016-02-17 ENCOUNTER — Telehealth: Payer: Self-pay | Admitting: *Deleted

## 2016-02-17 NOTE — Telephone Encounter (Signed)
Spoke with patient re; procedure on yesterday. Denies any questions or concerns.  

## 2016-02-29 ENCOUNTER — Encounter: Payer: Self-pay | Admitting: Pain Medicine

## 2016-02-29 ENCOUNTER — Ambulatory Visit: Payer: BLUE CROSS/BLUE SHIELD | Attending: Pain Medicine | Admitting: Pain Medicine

## 2016-02-29 VITALS — BP 117/100 | HR 95 | Temp 98.1°F | Resp 18 | Ht 68.0 in | Wt 150.0 lb

## 2016-02-29 DIAGNOSIS — F411 Generalized anxiety disorder: Secondary | ICD-10-CM | POA: Diagnosis not present

## 2016-02-29 DIAGNOSIS — Z5181 Encounter for therapeutic drug level monitoring: Secondary | ICD-10-CM

## 2016-02-29 DIAGNOSIS — Z7982 Long term (current) use of aspirin: Secondary | ICD-10-CM | POA: Insufficient documentation

## 2016-02-29 DIAGNOSIS — R51 Headache: Secondary | ICD-10-CM | POA: Diagnosis not present

## 2016-02-29 DIAGNOSIS — E7800 Pure hypercholesterolemia, unspecified: Secondary | ICD-10-CM | POA: Insufficient documentation

## 2016-02-29 DIAGNOSIS — M47816 Spondylosis without myelopathy or radiculopathy, lumbar region: Secondary | ICD-10-CM | POA: Diagnosis not present

## 2016-02-29 DIAGNOSIS — M1611 Unilateral primary osteoarthritis, right hip: Secondary | ICD-10-CM | POA: Insufficient documentation

## 2016-02-29 DIAGNOSIS — Z79891 Long term (current) use of opiate analgesic: Secondary | ICD-10-CM | POA: Diagnosis not present

## 2016-02-29 DIAGNOSIS — Z8546 Personal history of malignant neoplasm of prostate: Secondary | ICD-10-CM | POA: Diagnosis not present

## 2016-02-29 DIAGNOSIS — M47812 Spondylosis without myelopathy or radiculopathy, cervical region: Secondary | ICD-10-CM | POA: Diagnosis not present

## 2016-02-29 DIAGNOSIS — M25551 Pain in right hip: Secondary | ICD-10-CM | POA: Diagnosis not present

## 2016-02-29 DIAGNOSIS — Z8673 Personal history of transient ischemic attack (TIA), and cerebral infarction without residual deficits: Secondary | ICD-10-CM | POA: Insufficient documentation

## 2016-02-29 DIAGNOSIS — M25569 Pain in unspecified knee: Secondary | ICD-10-CM | POA: Diagnosis present

## 2016-02-29 DIAGNOSIS — R262 Difficulty in walking, not elsewhere classified: Secondary | ICD-10-CM | POA: Diagnosis not present

## 2016-02-29 DIAGNOSIS — K449 Diaphragmatic hernia without obstruction or gangrene: Secondary | ICD-10-CM | POA: Insufficient documentation

## 2016-02-29 DIAGNOSIS — R1310 Dysphagia, unspecified: Secondary | ICD-10-CM | POA: Insufficient documentation

## 2016-02-29 DIAGNOSIS — Z87891 Personal history of nicotine dependence: Secondary | ICD-10-CM | POA: Insufficient documentation

## 2016-02-29 DIAGNOSIS — M549 Dorsalgia, unspecified: Secondary | ICD-10-CM | POA: Diagnosis present

## 2016-02-29 DIAGNOSIS — N529 Male erectile dysfunction, unspecified: Secondary | ICD-10-CM | POA: Diagnosis not present

## 2016-02-29 DIAGNOSIS — G8929 Other chronic pain: Secondary | ICD-10-CM | POA: Diagnosis not present

## 2016-02-29 DIAGNOSIS — J449 Chronic obstructive pulmonary disease, unspecified: Secondary | ICD-10-CM | POA: Insufficient documentation

## 2016-02-29 DIAGNOSIS — M545 Low back pain, unspecified: Secondary | ICD-10-CM

## 2016-02-29 DIAGNOSIS — E785 Hyperlipidemia, unspecified: Secondary | ICD-10-CM | POA: Diagnosis not present

## 2016-02-29 DIAGNOSIS — M546 Pain in thoracic spine: Secondary | ICD-10-CM | POA: Diagnosis not present

## 2016-02-29 DIAGNOSIS — R270 Ataxia, unspecified: Secondary | ICD-10-CM | POA: Diagnosis not present

## 2016-02-29 MED ORDER — OXYCODONE-ACETAMINOPHEN 10-325 MG PO TABS: 1.0000 | ORAL_TABLET | Freq: Every day | ORAL | Status: DC | PRN

## 2016-02-29 NOTE — Patient Instructions (Signed)
Facet Blocks Patient Information  Description: The facets are joints in the spine between the vertebrae.  Like any joints in the body, facets can become irritated and painful.  Arthritis can also effect the facets.  By injecting steroids and local anesthetic in and around these joints, we can temporarily block the nerve supply to them.  Steroids act directly on irritated nerves and tissues to reduce selling and inflammation which often leads to decreased pain.  Facet blocks may be done anywhere along the spine from the neck to the low back depending upon the location of your pain.   After numbing the skin with local anesthetic (like Novocaine), a small needle is passed onto the facet joints under x-ray guidance.  You may experience a sensation of pressure while this is being done.  The entire block usually lasts about 15-25 minutes.   Conditions which may be treated by facet blocks:   Low back/buttock pain  Neck/shoulder pain  Certain types of headaches  Preparation for the injection:  1. Do not eat any solid food or dairy products within 8 hours of your appointment. 2. You may drink clear liquid up to 3 hours before appointment.  Clear liquids include water, black coffee, juice or soda.  No milk or cream please. 3. You may take your regular medication, including pain medications, with a sip of water before your appointment.  Diabetics should hold regular insulin (if taken separately) and take 1/2 normal NPH dose the morning of the procedure.  Carry some sugar containing items with you to your appointment. 4. A driver must accompany you and be prepared to drive you home after your procedure. 5. Bring all your current medications with you. 6. An IV may be inserted and sedation may be given at the discretion of the physician. 7. A blood pressure cuff, EKG and other monitors will often be applied during the procedure.  Some patients may need to have extra oxygen administered for a short  period. 8. You will be asked to provide medical information, including your allergies and medications, prior to the procedure.  We must know immediately if you are taking blood thinners (like Coumadin/Warfarin) or if you are allergic to IV iodine contrast (dye).  We must know if you could possible be pregnant.  Possible side-effects:   Bleeding from needle site  Infection (rare, may require surgery)  Nerve injury (rare)  Numbness & tingling (temporary)  Difficulty urinating (rare, temporary)  Spinal headache (a headache worse with upright posture)  Light-headedness (temporary)  Pain at injection site (serveral days)  Decreased blood pressure (rare, temporary)  Weakness in arm/leg (temporary)  Pressure sensation in back/neck (temporary)   Call if you experience:   Fever/chills associated with headache or increased back/neck pain  Headache worsened by an upright position  New onset, weakness or numbness of an extremity below the injection site  Hives or difficulty breathing (go to the emergency room)  Inflammation or drainage at the injection site(s)  Severe back/neck pain greater than usual  New symptoms which are concerning to you  Please note:  Although the local anesthetic injected can often make your back or neck feel good for several hours after the injection, the pain will likely return. It takes 3-7 days for steroids to work.  You may not notice any pain relief for at least one week.  If effective, we will often do a series of 2-3 injections spaced 3-6 weeks apart to maximally decrease your pain.  After the initial series, you may be a candidate for a more permanent nerve block of the facets.  If you have any questions, please call #[phone] [facility]  Prescription given for Percocet x1.

## 2016-02-29 NOTE — Progress Notes (Signed)
Safety precautions to be maintained throughout the outpatient stay will include: orient to surroundings, keep bed in low position, maintain call bell within reach at all times, provide assistance with transfer out of bed and ambulation. Percocet pill count # 24/30  Filled 02-26-16

## 2016-02-29 NOTE — Progress Notes (Signed)
Patient's Name: Mark Hodges  Patient type: Established  MRN: [id number]  Service setting: Ambulatory outpatient  DOB: 05-26-1956  Location: [facility]  DOS: 02/29/2016  Primary Care Physician: Tracie Harrier, MD  Note by: Kathlen Brunswick. Dossie Arbour, M.D, DABA, DABAPM, DABPM, Milagros Evener, FIPP  Referring Physician: Tracie Harrier, MD  Specialty: Board-Certified Interventional Pain Management  Last Visit to Pain Management: 02/17/2016   Primary Reason(s) for Visit: Encounter for prescription drug management & post-procedure evaluation of chronic illness with mild to moderate exacerbation(Level of risk: moderate) CC: Back Pain and Knee Pain   HPI  Mark Hodges is a 60 y.o. year old, male patient, who returns today as an established patient. He has Difficulty in walking; Compulsive tobacco user syndrome; Hypercholesterolemia without hypertriglyceridemia; Long term current use of opiate analgesic; Long term prescription opiate use; Opiate use; Opiate dependence ([place]); Encounter for therapeutic drug level monitoring; Chronic pain; Chronic low back pain (Location of Primary Source of Pain) (Bilateral) (R>L); Chronic lumbar radicular pain (Polyradiculopathy) (Right); Ataxia; HLD (hyperlipidemia); Current tobacco use; Pure hypercholesterolemia; Arm numbness; Substance use disorder Risk: LOW; Chronic upper back pain; Cervical spondylosis; Chronic neck pain; Cervicogenic headache; Chronic radicular cervical pain; Chronic hip pain (Location of Secondary source of pain) (Right); Generalized anxiety disorder; Depression; At high risk for falls; Erectile dysfunction; History of TIA (transient ischemic attack); 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 His primarily concern today is the Back Pain and Knee Pain   Pain Assessment: Self-Reported Pain Score: 8 , clinically he looks like a 3/10. Reported level is inconsistent with clinical obrservations Pain Type: Chronic pain Pain Location: Back Pain Orientation: Mid, Upper Pain Descriptors / Indicators: Aching, Sharp Pain Frequency: Constant  The patient comes into the clinics today for post-procedure evaluation on the interventional treatment done on 02/16/2016. In addition, he comes in today for pharmacological management of his chronic pain.  The patient  reports that he does not use illicit drugs.  Date of Last Visit: 02/16/16 Service Provided on Last Visit: Procedure (blf)  Controlled Substance Pharmacotherapy Assessment & REMS (Risk Evaluation and Mitigation Strategy)  Analgesic: Previously he was taking oxycodone/APAP 10/325 one tablet by mouth every 6 hours (40 mg/day). Pill Count: Percocet pill count # 24/30 Filled 02-26-16 MME/day: 60 mg/day Pharmacokinetics: Onset of action (Liberation/Absorption): Within expected pharmacological parameters Time to Peak effect (Distribution): Timing and results are as within normal expected parameters Duration of action (Metabolism/Excretion): Within normal limits for medication Pharmacodynamics: Analgesic Effect: More than 50% Activity Facilitation: Medication(s) allow patient to sit, stand, walk, and do the basic ADLs Perceived Effectiveness: Described as relatively effective, allowing for increase in activities of daily living (ADL) Side-effects or Adverse reactions: None reported Monitoring: [place]  PMP: Online review of the past 38-month period conducted. Compliant with practice rules and regulations Last UDS on record: TOXASSURE SELECT 13  Date Value Ref Range Status  02/08/2016 FINAL  Final    Comment:    ==================================================================== TOXASSURE SELECT 13 (MW) ==================================================================== Test  Result       Flag       Units Drug Present and Declared for Prescription Verification   Oxycodone                      163          EXPECTED   ng/mg creat   Oxymorphone                    1192         EXPECTED   ng/mg creat   Noroxycodone                   573          EXPECTED   ng/mg creat   Noroxymorphone                 487          EXPECTED   ng/mg creat    Sources of oxycodone are scheduled prescription medications.    Oxymorphone, noroxycodone, and noroxymorphone are expected    metabolites of oxycodone. Oxymorphone is also available as a    scheduled prescription medication. ==================================================================== Test                      Result    Flag   Units      Ref Range   Creatinine              78               mg/dL      >=20 ==================================================================== Declared Medications:  The flagging and interpretation on this report are based on the  following declared medications.  Unexpected results may arise from  inaccuracies in the declared medications.  **Note: The testing scope of this panel includes these medications:  Oxycodone (Oxycodone Acetaminophen)  **Note: The testing scope of this panel does not include following  reported medications:  Acetaminophen (Oxycodone Acetaminophen)  Alprostadil  Aspirin  Atorvastatin  Gabapentin  Nortriptyline  Tamsulosin  Venlafaxine ==================================================================== For clinical consultation, please call [phone]. ====================================================================    UDS interpretation: Compliant Medication Assessment Form: Reviewed. Patient indicates being compliant with therapy Treatment compliance: Compliant Risk Assessment: Aberrant Behavior: None observed today Substance Use Disorder (SUD) Risk Level: Moderate-to-high Risk of opioid abuse or dependence: 0.7-3.0% with doses ? 36 MME/day and 6.1-26% with doses ? 120 MME/day. Opioid Risk Tool (ORT) Score:  4 Moderate Risk for SUD (Score between 4-7) Depression Scale Score: PHQ-2: PHQ-2 Total Score: 6 78.6% Probability of major depressive disorder (6) PHQ-9: PHQ-9 Total Score: 22 Severe depression (20-27): 2.4 times higher risk of SUD and 2.89 times higher risk of overuse  Pharmacologic Plan: No change in therapy, at this time. He indicates that he already has a psychiatrist and he is pending to see again for medication adjustment.  Post-Procedure Assessment  Procedure done on last visit: Diagnostic bilateral lumbar facet block #1 under fluoroscopic guidance and IV sedation. Side-effects or Adverse reactions: None reported Sedation: Sedation given  Results: Ultra-Short Term Relief (First 1 hour after procedure): 100 %  Analgesia during this period is likely to be Local Anesthetic and/or IV Sedative (Analgesic/Anxiolitic) related Short Term Relief (Initial 4-6 hrs after procedure): 100 % Complete relief confirms area to be the source of pain Long Term Relief : 25 % (Right leg pain and lower back was better, left knee, upper and mid back was not better.)  Long-term benefit would suggest an inflammatory etiology to the pain   Current Relief (Now): 25%  Persistent relief would suggest effective anti-inflammatory effects from steroids Interpretation of Results: Based on the results of this diagnostic injection, clearly a large percentage of his pain is coming from the lumbar facet joints. This means that he may be a good  candidate for radiofrequency ablation.We will bring the patient back for diagnostic injection #2.  Laboratory Chemistry  Inflammation Markers Lab Results  Component Value Date   ESRSEDRATE 5 11/19/2015   CRP 1.4* 11/19/2015    Renal Function Lab Results  Component Value Date   BUN 5* 11/19/2015   CREATININE 0.73 11/19/2015   GFRAA >60 11/19/2015   GFRNONAA >60 11/19/2015    Hepatic Function Lab Results  Component Value Date   AST 25 11/19/2015   ALT 24 11/19/2015   ALBUMIN 4.3 11/19/2015    Electrolytes Lab Results  Component Value Date   NA 140 11/19/2015   K 3.9 11/19/2015   CL 104 11/19/2015   CALCIUM 9.7 11/19/2015   MG 2.0 11/19/2015    Pain Modulating Vitamins No results found for: VD25OH, VD125OH2TOT, [id number], [id number], VITAMINB12  Coagulation Parameters No results found for: INR, LABPROT, APTT, PLT  Note: Labs reviewed. Results made available to patient  Recent Diagnostic Imaging  Dg Esophagus  01/19/2016  CLINICAL DATA:  Dysphagia. EXAM: ESOPHOGRAM / BARIUM SWALLOW / BARIUM TABLET STUDY TECHNIQUE: Combined double contrast and single contrast examination performed using effervescent crystals, thick barium liquid, and thin barium liquid. The patient was observed with fluoroscopy swallowing a 13 mm barium sulphate tablet. FLUOROSCOPY TIME:  Radiation Exposure Index (as provided by the fluoroscopic device): 14.1 mGy COMPARISON:  None. FINDINGS: Mild prominence of the cricopharyngeus fold noted. Cervical esophagus otherwise widely patent. Normal peristalsis. Thoracic esophagus widely patent. Small sliding hiatal hernia without evidence of reflux. Standardized barium tablet passed normally. IMPRESSION: 1. Slightly prominent cricopharyngeus muscle. 2. Small sliding hiatal hernia. No significant reflux. Exam is otherwise unremarkable. Electronically Signed   By: Marcello Moores  Register   On: 01/19/2016 09:21    Meds  The patient has a current medication list which  includes the following prescription(s): alprostadil, aspirin, atorvastatin, clonazepam, nortriptyline, oxycodone-acetaminophen, pantoprazole, tamsulosin, venlafaxine hcl, and gabapentin.  Current Outpatient Prescriptions on File Prior to Visit  Medication Sig  . alprostadil (EDEX) 10 MCG injection 10 mcg by Intracavitary route as needed for erectile dysfunction. use no more than 3 times per week  . aspirin 325 MG tablet Take by mouth.  Marland Kitchen atorvastatin (LIPITOR) 10 MG tablet Take 10 mg by mouth daily.   . clonazePAM (KLONOPIN) 0.5 MG tablet Take by mouth.  . nortriptyline (PAMELOR) 10 MG capsule Take 10 mg by mouth 3 (three) times daily.   . tamsulosin (FLOMAX) 0.4 MG CAPS capsule Take 1 capsule (0.4 mg total) by mouth daily.  . Venlafaxine HCl 225 MG TB24 Take 2 tablets by mouth daily.   Marland Kitchen gabapentin (NEURONTIN) 300 MG capsule Take 3 capsules (900 mg total) by mouth 3 (three) times daily. Follow titration schedule.   No current facility-administered medications on file prior to visit.    ROS  Constitutional: Denies any fever or chills Gastrointestinal: No reported hemesis, hematochezia, vomiting, or acute GI distress Musculoskeletal: Denies any acute onset joint swelling, redness, loss of ROM, or weakness Neurological: No reported episodes of acute onset apraxia, aphasia, dysarthria, agnosia, amnesia, paralysis, loss of coordination, or loss of consciousness  Allergies  Mark Hodges has No Known  Allergies.  [place]  Medical:  Mark Hodges  has a past medical history of Prostate cancer [facility]); BPH (benign prostatic hyperplasia); Erectile dysfunction; Premature ejaculation; Hypogonadism in male; Hyperlipidemia; Lower urinary tract infection; TIA (transient ischemic attack); Stroke [facility]); Arthritis; Depression; and Clinical depression (10/23/2014). Family: family history includes Heart disease in his father. Surgical:  has past surgical history that includes Tonsillectomy; Colonoscopy with propofol  (N/A, 01/26/2016); and Esophagogastroduodenoscopy (egd) with propofol (N/A, 01/26/2016). Tobacco:  reports that he has quit smoking. His smokeless tobacco use includes Chew. Alcohol:  reports that he does not drink alcohol. Drug:  reports that he does not use illicit drugs.  Constitutional Exam  Vitals: Blood pressure 117/100, pulse 95, temperature 98.1 F (36.7 C), resp. rate 18, height 5\' 8"  (1.727 m), weight 150 lb (68.04 kg), SpO2 100 %. General appearance: Well nourished, well developed, and well hydrated. In no acute distress Calculated BMI/Body habitus: Body mass index is 22.81 kg/(m^2). (18.5-24.9 kg/m2) Ideal body weight Psych/Mental status: Alert and oriented x 3 (person, place, & time) Eyes: PERLA Respiratory: No evidence of acute respiratory distress  Cervical Spine Exam  Inspection: No masses, redness, or swelling Alignment: Symmetrical ROM: Functional: ROM is within functional limits [facility]) Stability: No instability detected Muscle strength & Tone: Functionally intact Sensory: Unimpaired Palpation: No complaints of tenderness  Upper Extremity (UE) Exam    Side: Right upper extremity  Side: Left upper extremity  Inspection: No masses, redness, swelling, or asymmetry  Inspection: No masses, redness, swelling, or asymmetry  ROM:  ROM:  Functional: ROM is within functional limits [facility])  Functional: ROM is within functional limits [facility])  Muscle strength & Tone: Functionally intact  Muscle strength & Tone: Functionally intact  Sensory: Unimpaired  Sensory: Unimpaired  Palpation: Non-contributory  Palpation: Non-contributory   Thoracic Spine Exam  Inspection: No masses, redness, or swelling Alignment: Symmetrical ROM: Functional: ROM is within functional limits [facility]) Stability: No instability detected Sensory: Unimpaired Muscle strength & Tone: Functionally intact Palpation: No complaints of tenderness  Lumbar Spine Exam  Inspection: No masses, redness, or  swelling Alignment: Symmetrical ROM: Functional: Decreased ROM Stability: No instability detected Muscle strength & Tone: Functionally intact Sensory: Unimpaired Palpation: Tender Provocative Tests: Lumbar Hyperextension and rotation test: Positive for a bilateral lumbar facet pain. Patrick's Maneuver: deferred  Gait & Posture Assessment  Ambulation: Patient ambulates using a walker Gait: Antalgic Posture: WNL  Lower Extremity Exam    Side: Right lower extremity  Side: Left lower extremity  Inspection: No masses, redness, swelling, or asymmetry ROM:  Inspection: No masses, redness, swelling, or asymmetry ROM:  Functional: ROM is within functional limits [facility])  Functional: Reduced REM  Muscle strength & Tone: Functionally intact  Muscle strength & Tone: Functionally intact  Sensory: Unimpaired  Sensory: Abnormal, lack of sensation (Anesthesia) over the medial aspect of the knee.   Palpation: Non-contributory  Palpation: Non-contributory   Assessment & Plan  Primary Diagnosis & Pertinent Problem List: The primary encounter diagnosis was Chronic pain. Diagnoses of Encounter for therapeutic drug level monitoring, Long term current use of opiate analgesic, Chronic low back pain (Location of Secondary source of pain) (Bilateral) (R>L), Lumbar facet syndrome (Location of Primary Source of Pain) (Bilateral) (R>L), and Lumbar spondylosis, unspecified spinal osteoarthritis were also pertinent to this visit.  Visit Diagnosis: 1. Chronic pain   2. Encounter for therapeutic drug level monitoring   3. Long term current use of opiate analgesic   4. Chronic low back pain (Location of Secondary source of pain) (  Bilateral) (R>L)   5. Lumbar facet syndrome (Location of Primary Source of Pain) (Bilateral) (R>L)   6. Lumbar spondylosis, unspecified spinal osteoarthritis     Problems updated and reviewed during this visit: Problem  Chronic hip pain (Location of Secondary source of pain) (Right)   Chronic low back pain (Location of Primary Source of Pain) (Bilateral) (R>L)    Problem-specific Plan(s): No problem-specific assessment & plan notes found for this encounter.  No new assessment & plan notes have been filed under this hospital service since the last note was generated. Service: Pain Management   Plan of Care   Problem List Items Addressed This Visit      High   Chronic low back pain (Location of Primary Source of Pain) (Bilateral) (R>L) (Chronic)   Relevant Medications   oxyCODONE-acetaminophen (PERCOCET) 10-325 MG tablet   Chronic pain - Primary (Chronic)   Relevant Medications   oxyCODONE-acetaminophen (PERCOCET) 10-325 MG tablet   Lumbar facet syndrome (Location of Primary Source of Pain) (Bilateral) (R>L) (Chronic)   Relevant Medications   oxyCODONE-acetaminophen (PERCOCET) 10-325 MG tablet   Other Relevant Orders   LUMBAR FACET(MEDIAL BRANCH NERVE BLOCK) MBNB   Lumbar spondylosis (Chronic)   Relevant Medications   oxyCODONE-acetaminophen (PERCOCET) 10-325 MG tablet     Medium   Encounter for therapeutic drug level monitoring   Long term current use of opiate analgesic (Chronic)       Pharmacotherapy (Medications Ordered): Meds ordered this encounter  Medications  . oxyCODONE-acetaminophen (PERCOCET) 10-325 MG tablet    Sig: Take 1 tablet by mouth daily as needed for pain.    Dispense:  30 tablet    Refill:  0    Do not place this medication, or any other prescription from our practice, on "Automatic Refill". Patient may have prescription filled one day early if pharmacy is closed on scheduled refill date. Do not fill until: 03/20/16 To last until: 04/19/16    [facility] & Procedure Ordered: Orders Placed This Encounter  Procedures  . LUMBAR FACET(MEDIAL BRANCH NERVE BLOCK) MBNB    Imaging Ordered: None  Interventional Therapies: Scheduled:  Diagnostic bilateral lumbar facet block under fluoroscopic guidance and IV sedation #2.     Considering:  Bilateral lumbar facet radiofrequency ablation.    PRN Procedures:  None at this time.    Referral(s) or Consult(s): None at this time.  New Prescriptions   No medications on file    Medications administered during this visit: Mark Hodges no medications administered during this visit.  Requested PM Follow-up: Return in about 3 weeks (around 03/21/2016) for Procedure (Scheduled), Medication Management, (1-Mo).  Future Appointments Date Time Provider [place]  03/03/2016 1:00 PM Milinda Pointer, MD [facility] None  04/06/2016 8:30 AM Hollice Espy, MD [facility] None  04/12/2016 8:20 AM Milinda Pointer, MD [facility] None    Primary Care Physician: Tracie Harrier, MD Location: [facility] Note by: Kathlen Brunswick. Dossie Arbour, M.D, DABA, DABAPM, DABPM, DABIPP, FIPP  Pain Score Disclaimer: We use the NRS-11 scale. This is a self-reported, subjective measurement of pain severity with only modest accuracy. It is used primarily to identify changes within a particular patient. It must be understood that outpatient pain scales are significantly less accurate that those used for research, where they can be applied under ideal controlled circumstances with minimal exposure to variables. In reality, the score is likely to be a combination of pain intensity and pain affect, where pain affect describes the degree of emotional arousal or changes  in action readiness caused by the sensory experience of pain. Factors such as social and work situation, setting, emotional state, anxiety levels, expectation, and prior pain experience may influence pain perception and show large inter-individual differences that may also be affected by time variables.  Patient instructions provided at this appointment:: Patient Instructions      Facet Blocks Patient Information  Description: The facets are joints in the spine between the vertebrae.  Like any joints in  the body, facets can become irritated and painful.  Arthritis can also effect the facets.  By injecting steroids and local anesthetic in and around these joints, we can temporarily block the nerve supply to them.  Steroids act directly on irritated nerves and tissues to reduce selling and inflammation which often leads to decreased pain.  Facet blocks may be done anywhere along the spine from the neck to the low back depending upon the location of your pain.   After numbing the skin with local anesthetic (like Novocaine), a small needle is passed onto the facet joints under x-ray guidance.  You may experience a sensation of pressure while this is being done.  The entire block usually lasts about 15-25 minutes.   Conditions which may be treated by facet blocks:   Low back/buttock pain  Neck/shoulder pain  Certain types of headaches  Preparation for the injection:  1. Do not eat any solid food or dairy products within 8 hours of your appointment. 2. You may drink clear liquid up to 3 hours before appointment.  Clear liquids include water, black coffee, juice or soda.  No milk or cream please. 3. You may take your regular medication, including pain medications, with a sip of water before your appointment.  Diabetics should hold regular insulin (if taken separately) and take 1/2 normal NPH dose the morning of the procedure.  Carry some sugar containing items with you to your appointment. 4. A driver must accompany you and be prepared to drive you home after your procedure. 5. Bring all your current medications with you. 6. An IV may be inserted and sedation may be given at the discretion of the physician. 7. A blood pressure cuff, EKG and other monitors will often be applied during the procedure.  Some patients may need to have extra oxygen administered for a short period. 8. You will be asked to provide medical information, including your allergies and medications, prior to the procedure.  We must  know immediately if you are taking blood thinners (like Coumadin/Warfarin) or if you are allergic to IV iodine contrast (dye).  We must know if you could possible be pregnant.  Possible side-effects:   Bleeding from needle site  Infection (rare, may require surgery)  Nerve injury (rare)  Numbness & tingling (temporary)  Difficulty urinating (rare, temporary)  Spinal headache (a headache worse with upright posture)  Light-headedness (temporary)  Pain at injection site (serveral days)  Decreased blood pressure (rare, temporary)  Weakness in arm/leg (temporary)  Pressure sensation in back/neck (temporary)   Call if you experience:   Fever/chills associated with headache or increased back/neck pain  Headache worsened by an upright position  New onset, weakness or numbness of an extremity below the injection site  Hives or difficulty breathing (go to the emergency room)  Inflammation or drainage at the injection site(s)  Severe back/neck pain greater than usual  New symptoms which are concerning to you  Please note:  Although the local anesthetic injected can often make your back or neck  feel good for several hours after the injection, the pain will likely return. It takes 3-7 days for steroids to work.  You may not notice any pain relief for at least one week.  If effective, we will often do a series of 2-3 injections spaced 3-6 weeks apart to maximally decrease your pain.  After the initial series, you may be a candidate for a more permanent nerve block of the facets.  If you have any questions, please call #[phone]) [facility]  Prescription given for Percocet x1.

## 2016-03-03 ENCOUNTER — Ambulatory Visit: Payer: BLUE CROSS/BLUE SHIELD | Admitting: Pain Medicine

## 2016-03-08 ENCOUNTER — Encounter: Payer: Self-pay | Admitting: Pain Medicine

## 2016-03-08 ENCOUNTER — Ambulatory Visit: Payer: BLUE CROSS/BLUE SHIELD | Attending: Pain Medicine | Admitting: Pain Medicine

## 2016-03-08 VITALS — BP 126/85 | HR 99 | Temp 97.7°F | Resp 12 | Ht 68.0 in | Wt 150.0 lb

## 2016-03-08 DIAGNOSIS — G8929 Other chronic pain: Secondary | ICD-10-CM | POA: Insufficient documentation

## 2016-03-08 DIAGNOSIS — Z8673 Personal history of transient ischemic attack (TIA), and cerebral infarction without residual deficits: Secondary | ICD-10-CM | POA: Diagnosis not present

## 2016-03-08 DIAGNOSIS — M549 Dorsalgia, unspecified: Secondary | ICD-10-CM | POA: Diagnosis present

## 2016-03-08 DIAGNOSIS — E7800 Pure hypercholesterolemia, unspecified: Secondary | ICD-10-CM | POA: Insufficient documentation

## 2016-03-08 DIAGNOSIS — N529 Male erectile dysfunction, unspecified: Secondary | ICD-10-CM | POA: Insufficient documentation

## 2016-03-08 DIAGNOSIS — R262 Difficulty in walking, not elsewhere classified: Secondary | ICD-10-CM | POA: Insufficient documentation

## 2016-03-08 DIAGNOSIS — M545 Low back pain: Secondary | ICD-10-CM | POA: Insufficient documentation

## 2016-03-08 DIAGNOSIS — F17210 Nicotine dependence, cigarettes, uncomplicated: Secondary | ICD-10-CM | POA: Diagnosis not present

## 2016-03-08 DIAGNOSIS — M47816 Spondylosis without myelopathy or radiculopathy, lumbar region: Secondary | ICD-10-CM | POA: Insufficient documentation

## 2016-03-08 DIAGNOSIS — M47892 Other spondylosis, cervical region: Secondary | ICD-10-CM | POA: Insufficient documentation

## 2016-03-08 DIAGNOSIS — Z79891 Long term (current) use of opiate analgesic: Secondary | ICD-10-CM | POA: Insufficient documentation

## 2016-03-08 DIAGNOSIS — M160 Bilateral primary osteoarthritis of hip: Secondary | ICD-10-CM | POA: Insufficient documentation

## 2016-03-08 DIAGNOSIS — M5416 Radiculopathy, lumbar region: Secondary | ICD-10-CM | POA: Insufficient documentation

## 2016-03-08 DIAGNOSIS — Z8546 Personal history of malignant neoplasm of prostate: Secondary | ICD-10-CM | POA: Insufficient documentation

## 2016-03-08 DIAGNOSIS — E785 Hyperlipidemia, unspecified: Secondary | ICD-10-CM | POA: Diagnosis not present

## 2016-03-08 DIAGNOSIS — M546 Pain in thoracic spine: Secondary | ICD-10-CM | POA: Diagnosis not present

## 2016-03-08 DIAGNOSIS — J449 Chronic obstructive pulmonary disease, unspecified: Secondary | ICD-10-CM | POA: Diagnosis not present

## 2016-03-08 MED ORDER — LIDOCAINE HCL (PF) 1 % IJ SOLN
10.0000 mL | Freq: Once | INTRAMUSCULAR | Status: AC
  Administered 2016-03-08: 10 mL

## 2016-03-08 MED ORDER — MIDAZOLAM HCL 5 MG/5ML IJ SOLN
1.0000 mg | INTRAMUSCULAR | Status: DC | PRN
  Filled 2016-03-08: qty 5

## 2016-03-08 MED ORDER — ROPIVACAINE HCL 2 MG/ML IJ SOLN
9.0000 mL | Freq: Once | INTRAMUSCULAR | Status: AC
  Administered 2016-03-08: 9 mL
  Filled 2016-03-08: qty 10

## 2016-03-08 MED ORDER — FENTANYL CITRATE (PF) 100 MCG/2ML IJ SOLN
25.0000 ug | INTRAMUSCULAR | Status: DC | PRN
  Filled 2016-03-08: qty 2

## 2016-03-08 MED ORDER — LACTATED RINGERS IV SOLN
1000.0000 mL | Freq: Once | INTRAVENOUS | Status: AC
  Administered 2016-03-08: 1000 mL via INTRAVENOUS

## 2016-03-08 MED ORDER — TRIAMCINOLONE ACETONIDE 40 MG/ML IJ SUSP
40.0000 mg | Freq: Once | INTRAMUSCULAR | Status: AC
  Administered 2016-03-08: 40 mg
  Filled 2016-03-08: qty 1

## 2016-03-08 MED ORDER — ROPIVACAINE HCL 2 MG/ML IJ SOLN
INTRAMUSCULAR | Status: AC
  Administered 2016-03-08: 14:00:00
  Filled 2016-03-08: qty 10

## 2016-03-08 NOTE — Patient Instructions (Signed)
Facet Blocks Patient Information  Description: The facets are joints in the spine between the vertebrae.  Like any joints in the body, facets can become irritated and painful.  Arthritis can also effect the facets.  By injecting steroids and local anesthetic in and around these joints, we can temporarily block the nerve supply to them.  Steroids act directly on irritated nerves and tissues to reduce selling and inflammation which often leads to decreased pain.  Facet blocks may be done anywhere along the spine from the neck to the low back depending upon the location of your pain.   After numbing the skin with local anesthetic (like Novocaine), a small needle is passed onto the facet joints under x-ray guidance.  You may experience a sensation of pressure while this is being done.  The entire block usually lasts about 15-25 minutes.   Conditions which may be treated by facet blocks:   Low back/buttock pain  Neck/shoulder pain  Certain types of headaches  Preparation for the injection:  1. Do not eat any solid food or dairy products within 8 hours of your appointment. 2. You may drink clear liquid up to 3 hours before appointment.  Clear liquids include water, black coffee, juice or soda.  No milk or cream please. 3. You may take your regular medication, including pain medications, with a sip of water before your appointment.  Diabetics should hold regular insulin (if taken separately) and take 1/2 normal NPH dose the morning of the procedure.  Carry some sugar containing items with you to your appointment. 4. A driver must accompany you and be prepared to drive you home after your procedure. 5. Bring all your current medications with you. 6. An IV may be inserted and sedation may be given at the discretion of the physician. 7. A blood pressure cuff, EKG and other monitors will often be applied during the procedure.  Some patients may need to have extra oxygen administered for a short  period. 8. You will be asked to provide medical information, including your allergies and medications, prior to the procedure.  We must know immediately if you are taking blood thinners (like Coumadin/Warfarin) or if you are allergic to IV iodine contrast (dye).  We must know if you could possible be pregnant.  Possible side-effects:   Bleeding from needle site  Infection (rare, may require surgery)  Nerve injury (rare)  Numbness & tingling (temporary)  Difficulty urinating (rare, temporary)  Spinal headache (a headache worse with upright posture)  Light-headedness (temporary)  Pain at injection site (serveral days)  Decreased blood pressure (rare, temporary)  Weakness in arm/leg (temporary)  Pressure sensation in back/neck (temporary)   Call if you experience:   Fever/chills associated with headache or increased back/neck pain  Headache worsened by an upright position  New onset, weakness or numbness of an extremity below the injection site  Hives or difficulty breathing (go to the emergency room)  Inflammation or drainage at the injection site(s)  Severe back/neck pain greater than usual  New symptoms which are concerning to you  Please note:  Although the local anesthetic injected can often make your back or neck feel good for several hours after the injection, the pain will likely return. It takes 3-7 days for steroids to work.  You may not notice any pain relief for at least one week.  If effective, we will often do a series of 2-3 injections spaced 3-6 weeks apart to maximally decrease your pain.  After the initial   series, you may be a candidate for a more permanent nerve block of the facets.  If you have any questions, please call #[phone] [place]: Please fill out the post procedure pain diary and bring it with you at your next appointment.  Pain Management Discharge Instructions  General  Discharge Instructions :  If you need to reach your doctor call: Monday-Friday 8:00 am - 4:00 pm at [phone] or toll free [phone].  After clinic hours [phone] to have operator reach doctor.  Bring all of your medication bottles to all your appointments in the pain clinic.  To cancel or reschedule your appointment with Pain Management please remember to call 24 hours in advance to avoid a fee.  Refer to the educational materials which you have been given on: General Risks, I had my Procedure. Discharge Instructions, Post Sedation.  Post Procedure Instructions:  The drugs you were given will stay in your system until tomorrow, so for the next 24 hours you should not drive, make any legal decisions or drink any alcoholic beverages.  You may eat anything you prefer, but it is better to start with liquids then soups and crackers, and gradually work up to solid foods.  Please notify your doctor immediately if you have any unusual bleeding, trouble breathing or pain that is not related to your normal pain.  Depending on the type of procedure that was done, some parts of your body may feel week and/or numb.  This usually clears up by tonight or the next day.  Walk with the use of an assistive device or accompanied by an adult for the 24 hours.  You may use ice on the affected area for the first 24 hours.  Put ice in a Ziploc bag and cover with a towel and place against area 15 minutes on 15 minutes off.  You may switch to heat after 24 hours.  Be careful moving about. Muscle spasms in the area of the injection may occur.  Use ice for the next 24 hours (15 minutes on, 15 minutes off).  After 24 hours, you may use heat for comfort if you wish.  Post procedure numbness or redness is expected, average 4-6 hours.  If numbness develops after 4-6 hours and is felt to be progressing and worsening, immediately contact your physician.

## 2016-03-08 NOTE — Progress Notes (Signed)
Patient's Name: Mark Hodges  Patient type: Established  MRN: [id number]  Service setting: Ambulatory outpatient  DOB: 01/29/1956  Location: [facility]  DOS: 03/08/2016  Primary Care Physician: Tracie Harrier, MD  Note by: Kathlen Brunswick. Dossie Arbour, M.D, DABA, DABAPM, DABPM, Milagros Evener, FIPP  Referring Physician: Tracie Harrier, MD  Specialty: Board-Certified Interventional Pain Management  Last Visit to Pain Management: 02/29/2016   Primary Reason(s) for Visit: Interventional Pain Management Treatment. CC: Back Pain  Primary Diagnosis: Facet syndrome, lumbar [M54.5]   Procedure:  Anesthesia, Analgesia, Anxiolysis:  Type: Diagnostic Medial Branch Facet Block Region: Lumbar Level: L2, L3, L4, L5, & S1 Medial Branch Level(s) Laterality: Bilateral  Indications: 1. Lumbar facet syndrome (Location of Primary Source of Pain) (Bilateral) (R>L)   2. Chronic low back pain (Location of Primary Source of Pain) (Bilateral) (R>L)   3. Lumbar spondylosis, unspecified spinal osteoarthritis     Pre-procedure Pain Score: 6/10 Reported level of pain is compatible with clinical observations Post-procedure Pain Score: 0-No pain  Type: Moderate (Conscious) Sedation & Local Anesthesia Local Anesthetic: Lidocaine 1% Route: Intravenous (IV) IV Access: Secured Sedation: Meaningful verbal contact was maintained at all times during the procedure  Indication(s): Analgesia & Anxiolysis   Pre-Procedure Assessment:  Mark Hodges is a 60 y.o. year old, male patient, seen today for interventional treatment. He has Difficulty in walking; Compulsive tobacco user syndrome; Hypercholesterolemia without hypertriglyceridemia; Long term current use of opiate analgesic; Long term prescription opiate use; Opiate use; Opiate dependence ([place]); Encounter for therapeutic drug level monitoring; Chronic pain; Chronic low back pain (Location of Primary Source of Pain) (Bilateral) (R>L); Chronic lumbar  radicular pain (Polyradiculopathy) (Right); Ataxia; HLD (hyperlipidemia); Current tobacco use; Pure hypercholesterolemia; Arm numbness; Substance use disorder Risk: LOW; Chronic upper back pain; Cervical spondylosis; Chronic neck pain; Cervicogenic headache; Chronic radicular cervical pain; Chronic hip pain (Location of Secondary source of pain) (Right); Generalized anxiety disorder; Depression; At high risk for falls; Erectile dysfunction; History of TIA (transient ischemic attack); 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 His primarily concern today is the Back Pain   Pain Type: Chronic pain Pain Location: Back Pain Orientation: Lower Pain Descriptors / Indicators: Aching, Sharp Pain Frequency: Constant  Date of Last Visit: 02/29/16 Service Provided on Last Visit: Med Refill, Evaluation  Coagulation Parameters No results found for: INR, LABPROT, APTT, PLT  Verification of the correct person, correct site (including marking of site), and correct procedure were performed and confirmed by the patient.  Consent: Secured. Under the influence of no sedatives a written informed consent was obtained, after having provided information on the risks and possible complications. To fulfill our ethical and legal obligations, as recommended by the American Medical Association's Code of Ethics, we have provided information  to the patient about our clinical impression; the nature and purpose of the treatment or procedure; the risks, benefits, and possible complications of the intervention; alternatives; the risk(s) and benefit(s) of the alternative treatment(s) or procedure(s); and the risk(s) and benefit(s) of doing nothing. The patient was provided information  about the risks and possible complications associated with the procedure. These include, but are not limited to, failure to achieve desired goals, infection, bleeding, organ or nerve damage, allergic reactions, paralysis, and death. In the case of spinal procedures these may include, but are not limited to, failure to achieve desired goals, infection, bleeding, organ or nerve damage, allergic reactions, paralysis, and death. In addition, the patient was informed that Medicine is not an exact science; therefore, there is also the possibility of unforeseen risks and possible complications that may result in a catastrophic outcome. The patient indicated having understood very clearly. We have given the patient no guarantees and we have made no promises. Enough time was given to the patient to ask questions, all of which were answered to the patient's satisfaction.  Consent Attestation: I, the ordering provider, attest that I have discussed with the patient the benefits, risks, side-effects, alternatives, likelihood of achieving goals, and potential problems during recovery for the procedure that I have provided informed consent.  Pre-Procedure Preparation: Safety Precautions: Allergies reviewed. Appropriate site, procedure, and patient were confirmed by following the Joint Commission's Universal Protocol (UP.01.01.01), in the form of a "Time Out". The patient was asked to confirm marked site and procedure, before commencing. The patient was asked about blood thinners, or active infections, both of which were denied. Patient was assessed for positional comfort and all  pressure points were checked before starting procedure. Allergies: He has No Known Allergies.. Infection Control Precautions: Sterile technique used. Standard Universal Precautions were taken as recommended by the Department of [facility] for Disease Control and Prevention (CDC). Standard pre-surgical skin prep was conducted. Respiratory hygiene and cough etiquette was practiced. Hand hygiene observed. Safe injection practices and needle disposal techniques followed. SDV (single dose vial) medications used. Medications properly checked for expiration dates and contaminants. Personal protective equipment (PPE) used: Sterile Radiation-resistant gloves. Monitoring:  As per clinic protocol. Filed Vitals:   03/08/16 1408 03/08/16 1410 03/08/16 1417 03/08/16 1427  BP: 116/70 105/86 131/86 132/88  Pulse:      Temp:   96.9 F (36.1 C)   Resp: 19 15 14 13   Height:      Weight:      SpO2: 99% 98% 99% 98%  Calculated BMI: Body mass index is 22.81 kg/(m^2).  Description of Procedure Process:   Time-out: "Time-out" completed before starting procedure, as per protocol. Position: Prone Target Area: For Lumbar Facet blocks, the target is the groove formed by the junction of the transverse process and superior articular process. For the L5 dorsal ramus, the target is the notch between superior articular process and sacral ala. For the S1 dorsal ramus, the target is the superior and lateral edge of the posterior S1 Sacral foramen. Approach: Paramedial approach. Area Prepped: Entire Posterior Lumbosacral Region Prepping solution: ChloraPrep (2% chlorhexidine gluconate and 70% isopropyl alcohol) Safety Precautions: Aspiration looking for blood return was conducted prior to all injections. At no point did we inject any substances, as a needle was being advanced. No attempts were made at seeking any paresthesias. Safe injection practices and needle disposal techniques used. Medications properly checked  for expiration dates. SDV (single dose vial) medications used.   Description of the Procedure: Protocol guidelines were followed. The patient was placed in position over the fluoroscopy table. The target area was identified and the area prepped in the usual manner. Skin desensitized using vapocoolant spray. Skin & deeper tissues infiltrated with local anesthetic. Appropriate amount of time allowed to pass for  local anesthetics to take effect. The procedure needle was introduced through the skin, ipsilateral to the reported pain, and advanced to the target area. Employing the "Medial Branch Technique", the needles were advanced to the angle made by the superior and medial portion of the transverse process, and the lateral and inferior portion of the superior articulating process of the targeted vertebral bodies. This area is known as "Burton's Eye" or the "Eye of the [place] Dog". A procedure needle was introduced through the skin, and this time advanced to the angle made by the superior and medial border of the sacral ala, and the lateral border of the S1 vertebral body. This last needle was later repositioned at the superior and lateral border of the posterior S1 foramen. Negative aspiration confirmed. Solution injected in intermittent fashion, asking for systemic symptoms every 0.5cc of injectate. The needles were then removed and the area cleansed, making sure to leave some of the prepping solution back to take advantage of its long term bactericidal properties. EBL: None Materials & Medications Used:  Needle(s) Used: 22g - 3.5" Spinal Needle(s)  Imaging Guidance:   Type of Imaging Technique: Fluoroscopy Guidance (Spinal) Indication(s): Assistance in needle guidance and placement for procedures requiring needle placement in or near specific anatomical locations not easily accessible without such assistance. Exposure Time: Please see nurses notes. Contrast: None required. Fluoroscopic Guidance: I was  personally present in the fluoroscopy suite, where the patient was placed in position for the procedure, over the fluoroscopy-compatible table. Fluoroscopy was manipulated, using "Tunnel Vision Technique", to obtain the best possible view of the target area, on the affected side. Parallax error was corrected before commencing the procedure. A "direction-depth-direction" technique was used to introduce the needle under continuous pulsed fluoroscopic guidance. Once the target was reached, antero-posterior, oblique, and lateral fluoroscopic projection views were taken to confirm needle placement in all planes. Permanently recorded images stored by scanning into EMR. Interpretation: Intraoperative imaging interpretation by performing Physician. Adequate needle placement confirmed. Adequate needle placement confirmed in AP, lateral, & Oblique Views. No contrast injected.  Antibiotic Prophylaxis:  Indication(s): No indications identified. Type:  Antibiotics Given (last 72 hours)    None       Post-operative Assessment:   Complications: No immediate post-treatment complications were observed. Disposition: Return to clinic for follow-up evaluation. The patient tolerated the entire procedure well. A repeat set of vitals were taken after the procedure and the patient was kept under observation following institutional policy, for this procedure. Post-procedural neurological assessment was performed, showing return to baseline, prior to discharge. The patient was discharged home, once institutional criteria were met. The patient was provided with post-procedure discharge instructions, including a section on how to identify potential problems. Should any problems arise concerning this procedure, the patient was given instructions to immediately contact us, at any time, without hesitation. In any case, we plan to contact the patient by telephone for a follow-up status report regarding this interventional  procedure. Comments:  No additional relevant information.  Plan of Care   Problem List Items Addressed This Visit      High   Chronic low back pain (Location of Primary Source of Pain) (Bilateral) (R>L) (Chronic)   Relevant Medications   fentaNYL (SUBLIMAZE) injection 25-50 mcg   triamcinolone acetonide (KENALOG-40) injection 40 mg (Completed)   Lumbar facet syndrome (Location of Primary Source of Pain) (Bilateral) (R>L) - Primary (Chronic)   Relevant Medications   fentaNYL (SUBLIMAZE) injection 25-50 mcg   lactated ringers infusion 1,000 mL  midazolam (VERSED) 5 MG/5ML injection 1-2 mg   triamcinolone acetonide (KENALOG-40) injection 40 mg (Completed)   lidocaine (PF) (XYLOCAINE) 1 % injection 10 mL (Completed)   ropivacaine (PF) 2 mg/ml (0.2%) (NAROPIN) epidural 9 mL (Completed)   Other Relevant Orders   LUMBAR FACET(MEDIAL BRANCH NERVE BLOCK) MBNB   Lumbar spondylosis (Chronic)   Relevant Medications   fentaNYL (SUBLIMAZE) injection 25-50 mcg   triamcinolone acetonide (KENALOG-40) injection 40 mg (Completed)       Pharmacotherapy (Medications Ordered): Meds ordered this encounter  Medications  . fentaNYL (SUBLIMAZE) injection 25-50 mcg    Sig:     Make sure Narcan is available in the pyxis when using this medication. In the event of respiratory depression (RR< 8/min): Titrate NARCAN (naloxone) in increments of 0.1 to 0.2 mg IV at 2-3 minute intervals, until desired degree of reversal.  . lactated ringers infusion 1,000 mL    Sig:   . midazolam (VERSED) 5 MG/5ML injection 1-2 mg    Sig:     Make sure Flumazenil is available in the pyxis when using this medication. If oversedation occurs, administer 0.2 mg IV over 15 sec. If after 45 sec no response, administer 0.2 mg again over 1 min; may repeat at 1 min intervals; not to exceed 4 doses (1 mg)  . triamcinolone acetonide (KENALOG-40) injection 40 mg    Sig:   . lidocaine (PF) (XYLOCAINE) 1 % injection 10 mL    Sig:   .  ropivacaine (PF) 2 mg/ml (0.2%) (NAROPIN) epidural 9 mL    Sig:   . ropivacaine (PF) 2 mg/ml (0.2%) (NAROPIN) 2 MG/ML epidural    Sig:     Merrilee Seashore: cabinet override    Lab-work & Procedure Ordered: Orders Placed This Encounter  Procedures  . LUMBAR FACET(MEDIAL BRANCH NERVE BLOCK) MBNB    New medications started at this visit: New Prescriptions   No medications on file    Medications administered during this visit: We administered lactated ringers, triamcinolone acetonide, lidocaine (PF), ropivacaine (PF) 2 mg/ml (0.2%), and ropivacaine (PF) 2 mg/ml (0.2%).  Requested PM Follow-up: Return for Post-Procedure Eval (2 weeks).  Future Appointments Date Time Provider [place]  04/06/2016 8:30 AM Hollice Espy, MD [facility] None  04/14/2016 9:20 AM Milinda Pointer, MD [facility] None    Primary Care Physician: Tracie Harrier, MD Location: [facility] Note by: Kathlen Brunswick. Dossie Arbour, M.D, DABA, DABAPM, DABPM, DABIPP, FIPP   Illustration of the posterior view of the lumbar spine and the posterior neural structures. Laminae of L2 through S1 are labeled. DPRL5, dorsal primary ramus of L5; DPRS1, dorsal primary ramus of S1; DPR3, dorsal primary ramus of L3; FJ, facet (zygapophyseal) joint L3-L4; I, inferior articular process of L4; LB1, lateral branch of dorsal primary ramus of L1; IAB, inferior articular branches from L3 medial branch (supplies L4-L5 facet joint); IBP, intermediate branch plexus; MB3, medial branch of dorsal primary ramus of L3; NR3, third lumbar nerve root; S, superior articular process of L5; SAB, superior articular branches from L4 (supplies L4-5 facet joint also); TP3, transverse process of L3.  Disclaimer:  Medicine is not an Chief Strategy Officer. The only guarantee in medicine is that nothing is guaranteed. It is important to note that the decision to proceed with this intervention was based on the information collected from the  patient. The Data and conclusions were drawn from the patient's questionnaire, the interview, and the physical examination. Because the information was provided in large part by the  patient, it cannot be guaranteed that it has not been purposely or unconsciously manipulated. Every effort has been made to obtain as much relevant data as possible for this evaluation. It is important to note that the conclusions that lead to this procedure are derived in large part from the available data. Always take into account that the treatment will also be dependent on availability of resources and existing treatment guidelines, considered by other Pain Management Practitioners as being common knowledge and practice, at the time of the intervention. For Medico-Legal purposes, it is also important to point out that variation in procedural techniques and pharmacological choices are the acceptable norm. The indications, contraindications, technique, and results of the above procedure should only be interpreted and judged by a Board-Certified Interventional Pain Specialist with extensive familiarity and expertise in the same exact procedure and technique. Attempts at providing opinions without similar or greater experience and expertise than that of the treating physician will be considered as inappropriate and unethical, and shall result in a formal complaint to the state medical board and applicable specialty societies.

## 2016-03-08 NOTE — Progress Notes (Signed)
Safety precautions to be maintained throughout the outpatient stay will include: orient to surroundings, keep bed in low position, maintain call bell within reach at all times, provide assistance with transfer out of bed and ambulation.  

## 2016-03-09 ENCOUNTER — Telehealth: Payer: Self-pay | Admitting: *Deleted

## 2016-03-09 NOTE — Telephone Encounter (Signed)
Denies any problems post procedure

## 2016-04-05 ENCOUNTER — Ambulatory Visit: Payer: BLUE CROSS/BLUE SHIELD | Admitting: Urology

## 2016-04-06 ENCOUNTER — Ambulatory Visit: Payer: BLUE CROSS/BLUE SHIELD | Admitting: Urology

## 2016-04-12 ENCOUNTER — Encounter: Payer: BLUE CROSS/BLUE SHIELD | Admitting: Pain Medicine

## 2016-04-14 ENCOUNTER — Ambulatory Visit: Payer: BLUE CROSS/BLUE SHIELD | Attending: Pain Medicine | Admitting: Pain Medicine

## 2016-04-14 ENCOUNTER — Encounter: Payer: Self-pay | Admitting: Pain Medicine

## 2016-04-14 VITALS — BP 137/87 | HR 101 | Temp 98.4°F | Resp 16 | Ht 68.0 in | Wt 150.0 lb

## 2016-04-14 DIAGNOSIS — Z79891 Long term (current) use of opiate analgesic: Secondary | ICD-10-CM | POA: Diagnosis not present

## 2016-04-14 DIAGNOSIS — M47896 Other spondylosis, lumbar region: Secondary | ICD-10-CM | POA: Diagnosis not present

## 2016-04-14 DIAGNOSIS — K449 Diaphragmatic hernia without obstruction or gangrene: Secondary | ICD-10-CM | POA: Diagnosis not present

## 2016-04-14 DIAGNOSIS — Z87891 Personal history of nicotine dependence: Secondary | ICD-10-CM | POA: Diagnosis not present

## 2016-04-14 DIAGNOSIS — M5126 Other intervertebral disc displacement, lumbar region: Secondary | ICD-10-CM | POA: Insufficient documentation

## 2016-04-14 DIAGNOSIS — Z8546 Personal history of malignant neoplasm of prostate: Secondary | ICD-10-CM | POA: Diagnosis not present

## 2016-04-14 DIAGNOSIS — F17210 Nicotine dependence, cigarettes, uncomplicated: Secondary | ICD-10-CM | POA: Diagnosis not present

## 2016-04-14 DIAGNOSIS — M160 Bilateral primary osteoarthritis of hip: Secondary | ICD-10-CM | POA: Insufficient documentation

## 2016-04-14 DIAGNOSIS — Z8673 Personal history of transient ischemic attack (TIA), and cerebral infarction without residual deficits: Secondary | ICD-10-CM | POA: Diagnosis not present

## 2016-04-14 DIAGNOSIS — G8929 Other chronic pain: Secondary | ICD-10-CM | POA: Diagnosis not present

## 2016-04-14 DIAGNOSIS — E7800 Pure hypercholesterolemia, unspecified: Secondary | ICD-10-CM | POA: Diagnosis not present

## 2016-04-14 DIAGNOSIS — J449 Chronic obstructive pulmonary disease, unspecified: Secondary | ICD-10-CM | POA: Diagnosis not present

## 2016-04-14 DIAGNOSIS — N529 Male erectile dysfunction, unspecified: Secondary | ICD-10-CM | POA: Insufficient documentation

## 2016-04-14 DIAGNOSIS — R270 Ataxia, unspecified: Secondary | ICD-10-CM | POA: Diagnosis not present

## 2016-04-14 DIAGNOSIS — M545 Low back pain: Secondary | ICD-10-CM

## 2016-04-14 DIAGNOSIS — F329 Major depressive disorder, single episode, unspecified: Secondary | ICD-10-CM | POA: Insufficient documentation

## 2016-04-14 DIAGNOSIS — M25551 Pain in right hip: Secondary | ICD-10-CM | POA: Insufficient documentation

## 2016-04-14 DIAGNOSIS — E785 Hyperlipidemia, unspecified: Secondary | ICD-10-CM | POA: Diagnosis not present

## 2016-04-14 DIAGNOSIS — F411 Generalized anxiety disorder: Secondary | ICD-10-CM | POA: Insufficient documentation

## 2016-04-14 DIAGNOSIS — M4806 Spinal stenosis, lumbar region: Secondary | ICD-10-CM | POA: Diagnosis not present

## 2016-04-14 DIAGNOSIS — M792 Neuralgia and neuritis, unspecified: Secondary | ICD-10-CM

## 2016-04-14 DIAGNOSIS — F1190 Opioid use, unspecified, uncomplicated: Secondary | ICD-10-CM

## 2016-04-14 DIAGNOSIS — Z5181 Encounter for therapeutic drug level monitoring: Secondary | ICD-10-CM | POA: Diagnosis not present

## 2016-04-14 DIAGNOSIS — M549 Dorsalgia, unspecified: Secondary | ICD-10-CM | POA: Diagnosis present

## 2016-04-14 DIAGNOSIS — M25569 Pain in unspecified knee: Secondary | ICD-10-CM | POA: Diagnosis present

## 2016-04-14 DIAGNOSIS — M47816 Spondylosis without myelopathy or radiculopathy, lumbar region: Secondary | ICD-10-CM

## 2016-04-14 MED ORDER — OXYCODONE-ACETAMINOPHEN 10-325 MG PO TABS: 1.0000 | ORAL_TABLET | Freq: Every day | ORAL | Status: DC | PRN

## 2016-04-14 NOTE — Progress Notes (Signed)
Patient's Name: Mark Hodges  Patient type: Established  MRN: [id number]  Service setting: Ambulatory outpatient  DOB: 1956-05-05  Location: [facility]  DOS: 04/14/2016  Primary Care Physician: Tracie Harrier, MD  Note by: Kathlen Brunswick. Dossie Arbour, M.D, DABA, DABAPM, DABPM, Milagros Evener, FIPP  Referring Physician: Tracie Harrier, MD  Specialty: Board-Certified Interventional Pain Management  Last Visit to Pain Management: 03/09/2016   Primary Reason(s) for Visit: Encounter for prescription drug management & post-procedure evaluation of chronic illness with mild to moderate exacerbation(Level of risk: moderate) CC: Back Pain and Knee Pain   HPI  Mark Hodges is a 60 y.o. year old, male patient, who returns today as an established patient. He has Difficulty in walking; Compulsive tobacco user syndrome; Hypercholesterolemia without hypertriglyceridemia; Long term current use of opiate analgesic; Long term prescription opiate use; Opiate use (15 MME/Day); Opiate dependence ([place]); Encounter for therapeutic drug level monitoring; Chronic pain; Chronic low back pain (Location of Primary Source of Pain) (Bilateral) (R>L); Chronic lumbar radicular pain (Polyradiculopathy) (Right); Ataxia; HLD (hyperlipidemia); Current tobacco use; Pure hypercholesterolemia; Arm numbness; Substance use disorder Risk: LOW; Chronic upper back pain; Cervical spondylosis; Chronic neck pain; Cervicogenic headache; Chronic radicular cervical pain; Chronic hip pain (Location of Secondary source of pain) (Right); Generalized anxiety disorder; Depression; At high risk for falls; Erectile dysfunction; History of TIA (transient ischemic attack); 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 without associated mass lesion or cord expansion.; Abnormal MRI, lumbar spine (06/26/2015); Lumbar spondylosis; 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 His primarily concern today is the Back Pain and Knee Pain   Pain Assessment: Self-Reported Pain Score: 5  Clinically the patient looks like a 2/10 Reported level is inconsistent with clinical obrservations Information on the proper use of the pain score provided to the patient today. Pain Type: Chronic pain Pain Location: Back Pain Orientation: Upper, Mid, Lower Pain Descriptors / Indicators: Aching, Constant, Sharp Pain Frequency: Constant  The patient comes into the clinics today for post-procedure evaluation on the interventional treatment done on 03/08/2016. In addition, he comes in today for pharmacological management of his chronic pain.  The patient  reports that he does not use illicit drugs.   The patient's wife has informed us that he seems to be having some neurological problems at the level of his central nervous system were he is not remembering things appropriately and he's currently being worked up for some type of a "blockage". When you talk to him he appears to respond positively, but if he pain attention, he does this before you have actually finished the sentence. At this point, since he has been doing well on his medications and his urine drug screen test have come back within normal limits, we will begin to get more lead and responsibility and I will see him back in 3 months instead of every month. We are still pending to have him return for a bilateral lumbar facet radiofrequency ablation which we have requested. He continues to get excellent results with  the diagnostic injections, but unfortunately they do not last long. Because of this, believed the patient to  be an excellent candidate for the radiofrequency.  Date of Last Visit: 03/08/16 Service Provided on Last Visit: Procedure  Controlled Substance Pharmacotherapy Assessment & REMS (Risk Evaluation and Mitigation Strategy)  Analgesic: Oxycodone/APAP 10/325 one daily MME/day: 15 mg/day.  Pill Count: Pills remaining 12/30 Oxy 10-325mg  Filled 03/30/16. Pharmacokinetics: Onset of action (Liberation/Absorption): Within expected pharmacological parameters Time to Peak effect (Distribution): Timing and results are as within normal expected parameters Duration of action (Metabolism/Excretion): Within normal limits for medication Pharmacodynamics: Analgesic Effect: More than 50% Activity Facilitation: Medication(s) allow patient to sit, stand, walk, and do the basic ADLs Perceived Effectiveness: Described as relatively effective, allowing for increase in activities of daily living (ADL) Side-effects or Adverse reactions: None reported Monitoring: [place] PMP: Online review of the past 36-month period conducted. Compliant with practice rules and regulations Last UDS on record: TOXASSURE SELECT 13  Date Value Ref Range Status  02/08/2016 FINAL  Final    Comment:    ==================================================================== TOXASSURE SELECT 13 (MW) ==================================================================== Test                             Result       Flag       Units Drug Present and Declared for Prescription Verification   Oxycodone                      163          EXPECTED   ng/mg creat   Oxymorphone                    1192         EXPECTED   ng/mg creat   Noroxycodone                   573          EXPECTED   ng/mg creat   Noroxymorphone                 487          EXPECTED   ng/mg creat    Sources of oxycodone are scheduled prescription medications.    Oxymorphone,  noroxycodone, and noroxymorphone are expected    metabolites of oxycodone. Oxymorphone is also available as a    scheduled prescription medication. ==================================================================== Test                      Result    Flag   Units      Ref Range   Creatinine              78               mg/dL      >=20 ==================================================================== Declared Medications:  The flagging and interpretation on this report are based on the  following declared medications.  Unexpected results may arise from  inaccuracies in the declared medications.  **Note: The testing scope of this panel includes these medications:  Oxycodone (Oxycodone Acetaminophen)  **Note: The testing scope of this panel does not include following  reported medications:  Acetaminophen (Oxycodone Acetaminophen)  Alprostadil  Aspirin  Atorvastatin  Gabapentin  Nortriptyline  Tamsulosin  Venlafaxine ==================================================================== For clinical consultation, please call [phone]. ====================================================================    UDS interpretation: Compliant          Medication Assessment Form: Reviewed. Patient indicates being compliant with therapy Treatment compliance: Compliant Risk Assessment: Aberrant  Behavior: None observed today Substance Use Disorder (SUD) Risk Level: Low Risk of opioid abuse or dependence: 0.7-3.0% with doses ? 36 MME/day and 6.1-26% with doses ? 120 MME/day. Opioid Risk Tool (ORT) Score: Total Score: 4 Moderate Risk for SUD (Score between 4-7) Depression Scale Score: PHQ-2: PHQ-2 Total Score: 5 56.4% Probability of major depressive disorder (5) PHQ-9: PHQ-9 Total Score: 25 Severe depression (20-27): 2.4 times higher risk of SUD and 2.89 times higher risk of overuse. The patient was questioned about his depression and he indicates that he does have a psychiatrist and he  currently is receiving medications from that physician and his medications are being adjusted.  Pharmacologic Plan: No change in therapy, at this time  Post-Procedure Assessment  Procedure done on last visit: Diagnostic bilateral lumbar facet block #2 under fluoroscopic guidance and IV sedation. Side-effects or Adverse reactions: None reported Sedation: Please see nurses note  Results: Ultra-Short Term Relief (First 1 hour after procedure): 100 %  Analgesia during this period is likely to be Local Anesthetic and/or IV Sedative (Analgesic/Anxiolitic) related Short Term Relief (Initial 4-6 hrs after procedure): 100 % Complete relief would confirms area to be the source of pain Long Term Relief : 30 % (80% on right leg- back still hurts) Long-term benefit would suggest an inflammatory etiology to the pain   Current Relief (Now): 50%   Persistent relief would suggest effective anti-inflammatory effects from steroids Interpretation of Results: At this point will move on to the radiofrequency as the 2 diagnostic procedures have proven to be very effective in controlling his pain for the duration of the local anesthetics.  Laboratory Chemistry  Inflammation Markers Lab Results  Component Value Date   ESRSEDRATE 5 11/19/2015   CRP 1.4* 11/19/2015    Renal Function Lab Results  Component Value Date   BUN 5* 11/19/2015   CREATININE 0.73 11/19/2015   GFRAA >60 11/19/2015   GFRNONAA >60 11/19/2015    Hepatic Function Lab Results  Component Value Date   AST 25 11/19/2015   ALT 24 11/19/2015   ALBUMIN 4.3 11/19/2015    Electrolytes Lab Results  Component Value Date   NA 140 11/19/2015   K 3.9 11/19/2015   CL 104 11/19/2015   CALCIUM 9.7 11/19/2015   MG 2.0 11/19/2015    Pain Modulating Vitamins No results found for: [place], VD125OH2TOT, [id number], [id number], 25OHVITD1, 25OHVITD2, 25OHVITD3, VITAMINB12  Coagulation Parameters No results found for: INR, LABPROT, APTT,  PLT  Cardiovascular No results found for: BNP, HGB, HCT  Note: Lab results reviewed.  Recent Diagnostic Imaging  Dg Esophagus  01/19/2016  CLINICAL DATA:  Dysphagia. EXAM: ESOPHOGRAM / BARIUM SWALLOW / BARIUM TABLET STUDY TECHNIQUE: Combined double contrast and single contrast examination performed using effervescent crystals, thick barium liquid, and thin barium liquid. The patient was observed with fluoroscopy swallowing a 13 mm barium sulphate tablet. FLUOROSCOPY TIME:  Radiation Exposure Index (as provided by the fluoroscopic device): 14.1 mGy COMPARISON:  None. FINDINGS: Mild prominence of the cricopharyngeus fold noted. Cervical esophagus otherwise widely patent. Normal peristalsis. Thoracic esophagus widely patent. Small sliding hiatal hernia without evidence of reflux. Standardized barium tablet passed normally. IMPRESSION: 1. Slightly prominent cricopharyngeus muscle. 2. Small sliding hiatal hernia. No significant reflux. Exam is otherwise unremarkable. Electronically Signed   By: Marcello Moores  Register   On: 01/19/2016 09:21   Cervical Imaging: Cervical MR wo contrast:  Results for orders placed during the hospital encounter of 01/03/15  [place] W/O Cm   Narrative *  PRIOR REPORT IMPORTED FROM AN EXTERNAL SYSTEM *   CLINICAL DATA:  Numbness and tingling in the right hand. Difficulty  walking. The patient fell 2 weeks ago.   EXAM:  MRI CERVICAL SPINE WITHOUT CONTRAST   TECHNIQUE:  Multiplanar, multisequence MR imaging of the cervical spine was  performed. No intravenous contrast was administered.   COMPARISON:  None.   FINDINGS:  The visualized intracranial contents, paraspinal soft tissues, and  cervical spinal cord appear normal. There is no mass lesion or  myelopathy or cervical spinal stenosis.   C1-2 and C2-3:  Normal.   C3-4:  Normal disc.  Slight left facet arthritis.   C4-5: Small uncinate spurs asymmetric to the right with no foraminal  stenosis or neural  impingement.   C5-6 through T1-2:  Normal.   IMPRESSION:  Minimal uncinate spurring at C4-5 to the right without neural  impingement.   Slight left facet arthritis at C3-4.   Otherwise, normal MRI of the cervical spine.    Electronically Signed    By: Lorriane Shire M.D.    On: 01/03/2015 23:08       Thoracic Imaging: Thoracic MR w/wo contrast:  Results for orders placed during the hospital encounter of 07/22/15  MR Thoracic Spine W Wo Contrast   Narrative CLINICAL DATA:  Left leg burning and numbness. The patient felt a pop while pulling trees on [place].  EXAM: MRI THORACIC SPINE WITHOUT AND WITH CONTRAST  TECHNIQUE: Multiplanar and multiecho pulse sequences of the thoracic spine were obtained without and with intravenous contrast.  CONTRAST:  41mL MULTIHANCE GADOBENATE DIMEGLUMINE 529 MG/ML IV SOLN  COMPARISON:  None.  FINDINGS: Normal signal is present in the thoracic spinal cord to the lowest imaged level, L1. A small syrinx is evident from T7-8 through T9-10. This is slightly less prominent than on the prior study. There is no associated mass lesion or enhancement.  Slight disc bulging at T11-12 is stable. There is no new focal disc protrusion. Dilated perineural root sleeve cysts at T5-6, T6-7, T7-8, particularly at T8-9 will or will are again noted without significant interval change.  The paraspinous soft tissues are within normal limits.  Mild left foraminal narrowing is present at T2-3 secondary to facet disease. The foramina are otherwise widely patent bilaterally.  Postcontrast images demonstrate no pathologic enhancement.  IMPRESSION: 1. No significant interval change in mild spondylosis of the cervical spine thoracic spine since August 2015. 2. Stable to slight decreased conspicuity of small syrinx from T7-8 through T9-10 without associated mass lesion or cord expansion. 3. Mild left foraminal narrowing at T2-3 is  stable.   Electronically Signed   By: San Morelle M.D.   On: 07/23/2015 08:31    Lumbosacral Imaging: Lumbar MR w/wo contrast:  Results for orders placed during the hospital encounter of 06/26/15  MR Lumbar Spine W Wo Contrast   Narrative EXAM: MRI LUMBAR SPINE WITHOUT AND WITH CONTRAST  TECHNIQUE: Multiplanar and multiecho pulse sequences of the lumbar spine were obtained without and with intravenous contrast.  CONTRAST:  40mL MULTIHANCE GADOBENATE DIMEGLUMINE 529 MG/ML IV SOLN  COMPARISON:  05/03/2014  FINDINGS: Normal alignment of the lumbar vertebral bodies. They demonstrate normal marrow signal. The conus medullaris terminates at T12-L1. Small remnant disc space at S1-2 is noted. The facets are normally aligned. No pars defects. No significant paraspinal or retroperitoneal abnormalities.  T11-12: Bulging annulus with mild impression on the ventral thecal sac.  T12-L1: Very small central disc extrusion with disc material  coursing up behind the T12 vertebral body. Minimal impression on the ventral thecal sac.  L1-2:  No significant findings.  L2-3:  No significant findings.  L3-4: Shallow central disc protrusion with mild impression on the ventral thecal sac. Mild diffuse annular bulge with mild lateral recess encroachment and mild bilateral foraminal encroachment, right greater than left. Potential irritation of the right L3 nerve root. This appears relatively stable.  L4-5: Mild annular bulge and mild facet disease but no focal disc protrusions, spinal or foraminal stenosis.  L5-S1: No disc protrusions, spinal or foraminal stenosis. Stable moderate facet disease.  IMPRESSION: 1. Stable small central disc extrusion at T12-L1. 2. Stable shallow central disc protrusion at L3-4 along with a diffuse bulging annulus with mild bilateral lateral recess encroachment and bilateral extra foraminal encroachment on the L3 nerve roots, right greater than  left.   Electronically Signed   By: Marijo Sanes M.D.   On: 06/26/2015 11:20    Lumbar DG 2-3 views:  Results for orders placed during the hospital encounter of 06/03/15  DG Lumbar Spine 2-3 Views   Narrative CLINICAL DATA:  Left hip/ buttock pain beginning this morning with inability to bear weight.  EXAM: LUMBAR SPINE - 2-3 VIEW  COMPARISON:  None.  FINDINGS: There are 5 non rib-bearing lumbar type vertebral bodies with a rudimentary disc at S1-2. There is minimal right convex curvature of the lumbar spine with apex at L3. There is no listhesis.  Vertebral body heights are preserved without evidence of compression fracture. Mild endplate spurring is present throughout the lumbar spine. Intervertebral disc space heights are preserved. Linear lucency with surrounding sclerosis in the right sacral ala may be developmental or related to remote trauma. Scattered distal abdominal aortic atherosclerotic calcification is noted.  IMPRESSION: 1. No evidence of acute osseous abnormality. 2. Mild lumbar spondylosis.   Electronically Signed   By: Logan Bores M.D.   On: 06/03/2015 13:40    Hip Imaging: Hip-R MR wo contrast:  Results for orders placed during the hospital encounter of 01/05/16  MR Hip Right Wo Contrast   Narrative CLINICAL DATA:  Low back and right hip pain and right leg pain.  EXAM: MR OF THE RIGHT HIP WITHOUT CONTRAST  TECHNIQUE: Multiplanar, multisequence MR imaging was performed. No intravenous contrast was administered.  COMPARISON:  Lumbar spine MRI 06/26/2015  FINDINGS: Both hips are normally located. Mild bilateral hip joint degenerative changes with joint space narrowing and early spurring. No stress fracture or AVN. No hip joint effusion. No periarticular fluid collections to suggest a paralabral cyst. No findings for peritendinosis or trochanteric bursitis.  The pubic symphysis and SI joints are intact. Mild degenerative changes. No  pelvic fractures or bone lesions. The surrounding hip and pelvic musculature appear normal. No muscle tear, myositis or muscle mass.  No significant intrapelvic abnormalities. Mild prostate gland enlargement. No inguinal mass or hernia.  IMPRESSION: 1. Mild bilateral hip joint degenerative changes but no stress fracture or AVN. 2. Intact surrounding hip and pelvic musculature. 3. No significant intrapelvic abnormalities. 4. Mild degenerative changes involving the pubic symphysis and SI joints.   Electronically Signed   By: Marijo Sanes M.D.   On: 01/05/2016 12:04    Hip-L DG 2-3 views:  Results for orders placed during the hospital encounter of 06/03/15  DG HIP UNILAT WITH PELVIS 2-3 VIEWS LEFT   Narrative CLINICAL DATA:  Pain while bending. No well-defined history of recent trauma  EXAM: DG HIP (WITH OR WITHOUT PELVIS) 2-3V  LEFT  COMPARISON:  May 06, 2014  FINDINGS: Frontal pelvis as well as frontal and lateral left hip images obtained. No fracture or dislocation. Joint spaces appear intact. There is a stable small focus of calcification just lateral to the acetabulum on the right. There is a small bone island in the right femoral neck.  IMPRESSION: No fracture or dislocation.  Joint spaces appear unremarkable.   Electronically Signed   By: Lowella Grip III M.D.   On: 06/03/2015 13:37    Note: Imaging results reviewed.  Meds  The patient has a current medication list which includes the following prescription(s): alprostadil, aspirin, atorvastatin, diazepam, gabapentin, lamotrigine, nortriptyline, nortriptyline, oxycodone-acetaminophen, oxycodone-acetaminophen, oxycodone-acetaminophen, pantoprazole, tamsulosin, and venlafaxine hcl.  Current Outpatient Prescriptions on File Prior to Visit  Medication Sig  . alprostadil (EDEX) 10 MCG injection 10 mcg by Intracavitary route as needed for erectile dysfunction. use no more than 3 times per week  . aspirin  325 MG tablet Take by mouth.  Marland Kitchen atorvastatin (LIPITOR) 10 MG tablet Take 10 mg by mouth daily.   . diazepam (VALIUM) 10 MG tablet Take 5 mg by mouth at bedtime as needed for anxiety.   . gabapentin (NEURONTIN) 300 MG capsule Take 3 capsules (900 mg total) by mouth 3 (three) times daily. Follow titration schedule.  . nortriptyline (PAMELOR) 10 MG capsule Take 10 mg by mouth 3 (three) times daily.   . pantoprazole (PROTONIX) 40 MG tablet   . tamsulosin (FLOMAX) 0.4 MG CAPS capsule Take 1 capsule (0.4 mg total) by mouth daily.  . Venlafaxine HCl 225 MG TB24 Take 2 tablets by mouth daily.    No current facility-administered medications on file prior to visit.    ROS  Constitutional: Denies any fever or chills Gastrointestinal: No reported hemesis, hematochezia, vomiting, or acute GI distress Musculoskeletal: Denies any acute onset joint swelling, redness, loss of ROM, or weakness Neurological: No reported episodes of acute onset apraxia, aphasia, dysarthria, agnosia, amnesia, paralysis, loss of coordination, or loss of consciousness  Allergies  Mr. Florez has No Known Allergies.  [place]  Medical:  Mr. Guttilla  has a past medical history of Prostate cancer [facility]); BPH (benign prostatic hyperplasia); Erectile dysfunction; Premature ejaculation; Hypogonadism in male; Hyperlipidemia; Lower urinary tract infection; TIA (transient ischemic attack); Stroke [facility]); Arthritis; Depression; and Clinical depression (10/23/2014). Family: family history includes Heart disease in his father. Surgical:  has past surgical history that includes Tonsillectomy; Colonoscopy with propofol (N/A, 01/26/2016); and Esophagogastroduodenoscopy (egd) with propofol (N/A, 01/26/2016). Tobacco:  reports that he has quit smoking. His smokeless tobacco use includes Chew. Alcohol:  reports that he does not drink alcohol. Drug:  reports that he does not use illicit drugs.  Constitutional Exam  Vitals: Blood pressure 137/87, pulse 101,  temperature 98.4 F (36.9 C), temperature source Oral, resp. rate 16, height 5\' 8"  (1.727 m), weight 150 lb (68.04 kg), SpO2 100 %. General appearance: Well nourished, well developed, and well hydrated. In no acute distress Calculated BMI/Body habitus: Body mass index is 22.81 kg/(m^2). (18.5-24.9 kg/m2) Ideal body weight Psych/Mental status: Alert and oriented x 3 (person, place, & time) Eyes: PERLA Respiratory: No evidence of acute respiratory distress  Cervical Spine Exam  Inspection: No masses, redness, or swelling Alignment: Symmetrical ROM: Functional: ROM is within functional limits [facility]) Stability: No instability detected Muscle strength & Tone: Functionally intact Sensory: Unimpaired Palpation: No complaints of tenderness  Upper Extremity (UE) Exam    Side: Right upper extremity  Side: Left upper extremity  Inspection: No masses, redness, swelling, or asymmetry  Inspection: No masses, redness, swelling, or asymmetry  ROM:  ROM:  Functional: ROM is within functional limits [facility])        Functional: ROM is within functional limits [facility])        Muscle strength & Tone: Functionally intact  Muscle strength & Tone: Functionally intact  Sensory: Unimpaired  Sensory: Unimpaired  Palpation: No complaints of tenderness  Palpation: No complaints of tenderness   Thoracic Spine Exam  Inspection: No masses, redness, or swelling Alignment: Symmetrical ROM: Functional: ROM is within functional limits [facility]) Stability: No instability detected Sensory: Unimpaired Muscle strength & Tone: Functionally intact Palpation: No complaints of tenderness  Lumbar Spine Exam  Inspection: No masses, redness, or swelling Alignment: Symmetrical ROM: Functional: Reduced ROM Stability: No instability detected Muscle strength & Tone: Increased muscle tone and tension Sensory: Movement-associated pain Palpation: Area tender to palpation Provocative Tests: Lumbar Hyperextension and rotation test:  Positive bilaterally for facet joint pain. Patrick's Maneuver: evaluation deferred today              Gait & Posture Assessment  Ambulation: Patient ambulates using a cane Gait: Limited. Using assistive device to ambulate Posture: WNL   Lower Extremity Exam    Side: Right lower extremity  Side: Left lower extremity  Inspection: No masses, redness, swelling, or asymmetry ROM:  Inspection: No masses, redness, swelling, or asymmetry ROM:  Functional: ROM is within functional limits [facility])        Functional: ROM is within functional limits [facility])        Muscle strength & Tone: Functionally intact  Muscle strength & Tone: Functionally intact  Sensory: Unimpaired  Sensory: Unimpaired  Palpation: No complaints of tenderness  Palpation: No complaints of tenderness   Assessment & Plan  Primary Diagnosis & Pertinent Problem List: The primary encounter diagnosis was Chronic pain. Diagnoses of Encounter for therapeutic drug level monitoring, Long term current use of opiate analgesic, Lumbar facet syndrome (Location of Primary Source of Pain) (Bilateral) (R>L), Chronic low back pain (Location of Primary Source of Pain) (Bilateral) (R>L), Neuropathic pain, and Opiate use (15 MME/Day) were also pertinent to this visit.  Visit Diagnosis: 1. Chronic pain   2. Encounter for therapeutic drug level monitoring   3. Long term current use of opiate analgesic   4. Lumbar facet syndrome (Location of Primary Source of Pain) (Bilateral) (R>L)   5. Chronic low back pain (Location of Primary Source of Pain) (Bilateral) (R>L)   6. Neuropathic pain   7. Opiate use (15 MME/Day)     Problems updated and reviewed during this visit: Problem  Opiate use (15 MME/Day)   Analgesic: Oxycodone/APAP 10/325 one daily MME/day: 15 mg/day.   Hld (Hyperlipidemia)  Difficulty in Walking, Not Elsewhere Classified  Clinical Depression  Mild Chronic Obstructive Pulmonary Disease (Hcc)    Problem-specific Plan(s): No  problem-specific assessment & plan notes found for this encounter.  No new assessment & plan notes have been filed under this hospital service since the last note was generated. Service: Pain Management   Plan of Care   Problem List Items Addressed This Visit      High   Chronic low back pain (Location of Primary Source of Pain) (Bilateral) (R>L) (Chronic)   Relevant Medications   oxyCODONE-acetaminophen (PERCOCET) 10-325 MG tablet   oxyCODONE-acetaminophen (PERCOCET) 10-325 MG tablet   oxyCODONE-acetaminophen (PERCOCET) 10-325 MG tablet   Chronic pain - Primary (Chronic)   Relevant Medications   nortriptyline (PAMELOR)  50 MG capsule   lamoTRIgine (LAMICTAL) 25 MG tablet   oxyCODONE-acetaminophen (PERCOCET) 10-325 MG tablet   oxyCODONE-acetaminophen (PERCOCET) 10-325 MG tablet   oxyCODONE-acetaminophen (PERCOCET) 10-325 MG tablet   Lumbar facet syndrome (Location of Primary Source of Pain) (Bilateral) (R>L) (Chronic)   Relevant Medications   oxyCODONE-acetaminophen (PERCOCET) 10-325 MG tablet   oxyCODONE-acetaminophen (PERCOCET) 10-325 MG tablet   oxyCODONE-acetaminophen (PERCOCET) 10-325 MG tablet   Other Relevant Orders   Radiofrequency,Lumbar   Neuropathic pain (Chronic)     Medium   Encounter for therapeutic drug level monitoring   Long term current use of opiate analgesic (Chronic)   Opiate use (15 MME/Day) (Chronic)       Pharmacotherapy (Medications Ordered): Meds ordered this encounter  Medications  . oxyCODONE-acetaminophen (PERCOCET) 10-325 MG tablet    Sig: Take 1 tablet by mouth daily as needed for pain.    Dispense:  30 tablet    Refill:  0    Do not place this medication, or any other prescription from our practice, on "Automatic Refill". Patient may have prescription filled one day early if pharmacy is closed on scheduled refill date. Do not fill until: 04/19/16 To last until: 05/19/16  . oxyCODONE-acetaminophen (PERCOCET) 10-325 MG tablet    Sig: Take  1 tablet by mouth daily as needed for pain.    Dispense:  30 tablet    Refill:  0    Do not place this medication, or any other prescription from our practice, on "Automatic Refill". Patient may have prescription filled one day early if pharmacy is closed on scheduled refill date. Do not fill until: 05/19/16 To last until: 06/18/16  . oxyCODONE-acetaminophen (PERCOCET) 10-325 MG tablet    Sig: Take 1 tablet by mouth daily as needed for pain.    Dispense:  30 tablet    Refill:  0    Do not place this medication, or any other prescription from our practice, on "Automatic Refill". Patient may have prescription filled one day early if pharmacy is closed on scheduled refill date. Do not fill until: 06/18/16 To last until: 07/18/16    [facility] & Procedure Ordered: Orders Placed This Encounter  Procedures  . Radiofrequency,Lumbar    Imaging Ordered: None  Interventional Therapies: Scheduled:  Bilateral lumbar facet radiofrequency ablation under fluoroscopic guidance and IV sedation. We will do one side first and will follow-up 6 weeks later with the opposite side.    Considering:  N/A   PRN Procedures:  N/A   Referral(s) or Consult(s): None at this time.  New Prescriptions   No medications on file    Medications administered during this visit: Mr. Delashmit had no medications administered during this visit.  Requested PM Follow-up: Return in about 2 months (around 06/27/2016) for Med-Mgmt, (3-Mo), Schedule Procedure.  Future Appointments Date Time Provider [place]  04/21/2016 8:30 AM Hollice Espy, MD [facility] None  06/27/2016 9:00 AM Milinda Pointer, MD [facility] None    Primary Care Physician: Tracie Harrier, MD Location: [facility] Outpatient [facility] Management Facility Note by: Kathlen Brunswick. Dossie Arbour, M.D, DABA, DABAPM, DABPM, DABIPP, FIPP  Pain Score Disclaimer: We use the NRS-11 scale. This is a self-reported, subjective measurement of pain severity with only modest  accuracy. It is used primarily to identify changes within a particular patient. It must be understood that outpatient pain scales are significantly less accurate that those used for research, where they can be applied under ideal controlled circumstances with minimal exposure to variables. In reality, the score is likely to  be a combination of pain intensity and pain affect, where pain affect describes the degree of emotional arousal or changes in action readiness caused by the sensory experience of pain. Factors such as social and work situation, setting, emotional state, anxiety levels, expectation, and prior pain experience may influence pain perception and show large inter-individual differences that may also be affected by time variables.  Patient instructions provided at this appointment:: Patient Instructions  You were given 3 prescriptions for Oxycodone. Radiofrequency Lesioning Radiofrequency lesioning is a procedure that is performed to relieve pain. The procedure is often used for back, neck, or arm pain. Radiofrequency lesioning involves the use of a machine that creates radio waves to make heat. During the procedure, the heat is applied to the nerve that carries the pain signal. The heat damages the nerve and interferes with the pain signal. Pain relief usually lasts for 6 months to 1 year. LET [facility] CARE PROVIDER KNOW ABOUT:  Any allergies you have.  All medicines you are taking, including vitamins, herbs, eye drops, creams, and over-the-counter medicines.  Previous problems you or members of your family have had with the use of anesthetics.  Any blood disorders you have.  Previous surgeries you have had.  Any medical conditions you have.  Whether you are pregnant or may be pregnant. RISKS AND COMPLICATIONS Generally, this is a safe procedure. However, problems may occur, including:  Pain or soreness at the injection site.  Infection at the injection site.  Damage to  nerves or blood vessels. BEFORE THE PROCEDURE  Ask your health care provider about:  Changing or stopping your regular medicines. This is especially important if you are taking diabetes medicines or blood thinners.  Taking medicines such as aspirin and ibuprofen. These medicines can thin your blood. Do not take these medicines before your procedure if your health care provider instructs you not to.  Follow instructions from your health care provider about eating or drinking restrictions.  Plan to have someone take you home after the procedure.  If you go home right after the procedure, plan to have someone with you for 24 hours. PROCEDURE  You will be given one or more of the following:  A medicine to help you relax (sedative).  A medicine to numb the area (local anesthetic).  You will be awake during the procedure. You will need to be able to talk with the health care provider during the procedure.  With the help of a type of X-ray (fluoroscopy), the health care provider will insert a radiofrequency needle into the area to be treated.  Next, a wire that carries the radio waves (electrode) will be put through the radiofrequency needle. An electrical pulse will be sent through the electrode to verify the correct nerve. You will feel a tingling sensation, and you may have muscle twitching.  Then, the tissue that is around the needle tip will be heated by an electric current that is passed using the radiofrequency machine. This will numb the nerves.  A bandage (dressing) will be put on the insertion area after the procedure is done. The procedure may vary among health care providers and hospitals. AFTER THE PROCEDURE  Your blood pressure, heart rate, breathing rate, and blood oxygen level will be monitored often until the medicines you were given have worn off.  Return to your normal activities as directed by your health care provider.   This information is not intended to replace  advice given to you by your health care  provider. Make sure you discuss any questions you have with your health care provider.   Document Released: 05/11/2011 Document Revised: 06/03/2015 Document Reviewed: 10/20/2014 Elsevier Interactive Patient Education [employer].

## 2016-04-14 NOTE — Patient Instructions (Signed)
You were given 3 prescriptions for Oxycodone. Radiofrequency Lesioning Radiofrequency lesioning is a procedure that is performed to relieve pain. The procedure is often used for back, neck, or arm pain. Radiofrequency lesioning involves the use of a machine that creates radio waves to make heat. During the procedure, the heat is applied to the nerve that carries the pain signal. The heat damages the nerve and interferes with the pain signal. Pain relief usually lasts for 6 months to 1 year. LET [facility] CARE PROVIDER KNOW ABOUT:  Any allergies you have.  All medicines you are taking, including vitamins, herbs, eye drops, creams, and over-the-counter medicines.  Previous problems you or members of your family have had with the use of anesthetics.  Any blood disorders you have.  Previous surgeries you have had.  Any medical conditions you have.  Whether you are pregnant or may be pregnant. RISKS AND COMPLICATIONS Generally, this is a safe procedure. However, problems may occur, including:  Pain or soreness at the injection site.  Infection at the injection site.  Damage to nerves or blood vessels. BEFORE THE PROCEDURE  Ask your health care provider about:  Changing or stopping your regular medicines. This is especially important if you are taking diabetes medicines or blood thinners.  Taking medicines such as aspirin and ibuprofen. These medicines can thin your blood. Do not take these medicines before your procedure if your health care provider instructs you not to.  Follow instructions from your health care provider about eating or drinking restrictions.  Plan to have someone take you home after the procedure.  If you go home right after the procedure, plan to have someone with you for 24 hours. PROCEDURE  You will be given one or more of the following:  A medicine to help you relax (sedative).  A medicine to numb the area (local anesthetic).  You will be awake  during the procedure. You will need to be able to talk with the health care provider during the procedure.  With the help of a type of X-ray (fluoroscopy), the health care provider will insert a radiofrequency needle into the area to be treated.  Next, a wire that carries the radio waves (electrode) will be put through the radiofrequency needle. An electrical pulse will be sent through the electrode to verify the correct nerve. You will feel a tingling sensation, and you may have muscle twitching.  Then, the tissue that is around the needle tip will be heated by an electric current that is passed using the radiofrequency machine. This will numb the nerves.  A bandage (dressing) will be put on the insertion area after the procedure is done. The procedure may vary among health care providers and hospitals. AFTER THE PROCEDURE  Your blood pressure, heart rate, breathing rate, and blood oxygen level will be monitored often until the medicines you were given have worn off.  Return to your normal activities as directed by your health care provider.   This information is not intended to replace advice given to you by your health care provider. Make sure you discuss any questions you have with your health care provider.   Document Released: 05/11/2011 Document Revised: 06/03/2015 Document Reviewed: 10/20/2014 Elsevier Interactive Patient Education [employer].

## 2016-04-14 NOTE — Progress Notes (Signed)
Safety precautions to be maintained throughout the outpatient stay will include: orient to surroundings, keep bed in low position, maintain call bell within reach at all times, provide assistance with transfer out of bed and ambulation.  Upon assessment today, pt with high depression score. He is currently seeking help and started a new medication and adjusting. Pills remaining 12/30  Oxy 10-325mg  Filled 03/30/16

## 2016-04-15 ENCOUNTER — Telehealth: Payer: Self-pay | Admitting: Urology

## 2016-04-15 NOTE — Telephone Encounter (Signed)
I received a request for medical records from [place] attorneys at law asking for records from 12-05-15 to present. The only thing in his chart was for 12-04-15   I faxed this to [phone] Ph# [phone]   [facility]

## 2016-04-19 ENCOUNTER — Other Ambulatory Visit: Payer: Self-pay

## 2016-04-21 ENCOUNTER — Ambulatory Visit (INDEPENDENT_AMBULATORY_CARE_PROVIDER_SITE_OTHER): Payer: BLUE CROSS/BLUE SHIELD | Admitting: Urology

## 2016-04-21 ENCOUNTER — Encounter: Payer: Self-pay | Admitting: Urology

## 2016-04-21 VITALS — BP 143/89 | HR 108 | Ht 68.0 in | Wt 136.8 lb

## 2016-04-21 DIAGNOSIS — N528 Other male erectile dysfunction: Secondary | ICD-10-CM | POA: Diagnosis not present

## 2016-04-21 DIAGNOSIS — C61 Malignant neoplasm of prostate: Secondary | ICD-10-CM | POA: Diagnosis not present

## 2016-04-21 DIAGNOSIS — R350 Frequency of micturition: Secondary | ICD-10-CM

## 2016-04-21 MED ORDER — ALPROSTADIL (VASODILATOR) 20 MCG IC SOLR: 20.0000 ug | INTRACAVERNOUS | 2 refills | Status: DC | PRN

## 2016-04-21 NOTE — Progress Notes (Signed)
8:45 AM  04/21/16   Mark Hodges Oct 01, 1955 [id number]  Referring provider: Tracie Harrier, MD [address] [facility] [place], [place] [address]  Chief Complaint  Patient presents with  . Follow-up    prostate cancer    HPI:  Prostate cancer  60 yo M with diagnosed in 01/2014 with T1c Gleason 3+3 prostate cancer in 2/12 cores, 1-3% and single core HGPIN dx 01/2014. PSA at time of dx 5.41. TRUS vol 34 cc. He has been on active surveillance.    Repeat biopsy on 02/20/15 per active surveillance protocol which showed only a small focus of atypical glands, suspicious for carcinoma   TRUS vol 33 cc.     His PSA has been relatively stable in the 5-7 range.  Most recent PSA 7.5 on 12/04/15.Marland Kitchen    He returns today for PSA/ DRE.  LUTS Also complains of LUTS, urinary frequency. Not taking any prostate meds. He was previously prescribed Flomax back in 9/ 2015 but never took this medication.  He does drink tea on a regular basis throughout the day.  PVR minimal on previous visits.    Advised to resume Flomax after last visit.  He does report that he is able to start his stream better with better flow.  His frequency persists but he does continue to drink a lot of fluids.    ED Baseline ED, failed multiple PDE5i. No sucess with VED.  Currently good success with caverject 10 mcg.   PHx chronic back pain managed by pain clinic.  He did have a colonoscopy since last visit.   PMH: Past Medical History:  Diagnosis Date  . Arthritis   . BPH (benign prostatic hyperplasia)   . Clinical depression 10/23/2014  . Depression   . Erectile dysfunction   . Hyperlipidemia   . Hypogonadism in male   . Lower urinary tract infection   . Premature ejaculation   . Prostate cancer ([place])   . Stroke ([place])   . TIA (transient ischemic attack)     Surgical History: Past Surgical History:  Procedure Laterality Date  . COLONOSCOPY WITH PROPOFOL N/A 01/26/2016   Procedure:  COLONOSCOPY WITH PROPOFOL;  Surgeon: Lollie Sails, MD;  [facility] ENDOSCOPY;  Service: Endoscopy;  Laterality: N/A;  . ESOPHAGOGASTRODUODENOSCOPY (EGD) WITH PROPOFOL N/A 01/26/2016   Procedure: ESOPHAGOGASTRODUODENOSCOPY (EGD) WITH PROPOFOL;  Surgeon: Lollie Sails, MD;  Location: [facility] ENDOSCOPY;  Service: Endoscopy;  Laterality: N/A;  . TONSILLECTOMY      Home Medications:    Medication List       Accurate as of 04/21/16  8:45 AM. Always use your most recent med list.          alprostadil 10 MCG injection Commonly known as:  EDEX 10 mcg by Intracavitary route as needed for erectile dysfunction. use no more than 3 times per week   aspirin 325 MG tablet Take by mouth.   atorvastatin 10 MG tablet Commonly known as:  LIPITOR Take 10 mg by mouth daily.   diazepam 10 MG tablet Commonly known as:  VALIUM Take 5 mg by mouth at bedtime as needed for anxiety.   gabapentin 300 MG capsule Commonly known as:  NEURONTIN Take 3 capsules (900 mg total) by mouth 3 (three) times daily. Follow titration schedule.   lamoTRIgine 25 MG tablet Commonly known as:  LAMICTAL Take 25 mg by mouth daily. Take 1 tablet at night for 1 week then increase by 1 tab per week up  6 tablets BID-  Will increase up to 6 pills bis   nortriptyline 10 MG capsule Commonly known as:  PAMELOR Take 10 mg by mouth 3 (three) times daily.   nortriptyline 50 MG capsule Commonly known as:  PAMELOR Take 50 mg by mouth 3 (three) times daily.   oxyCODONE-acetaminophen 10-325 MG tablet Commonly known as:  PERCOCET Take 1 tablet by mouth daily as needed for pain.   pantoprazole 40 MG tablet Commonly known as:  PROTONIX   tamsulosin 0.4 MG Caps capsule Commonly known as:  FLOMAX Take 1 capsule (0.4 mg total) by mouth daily.   Venlafaxine HCl 225 MG Tb24 Take 2 tablets by mouth daily.       Allergies: No Known Allergies  Family History: Family History  Problem Relation Age of Onset  . Heart  disease Father   . Prostate cancer Neg Hx   . Bladder Cancer Neg Hx     Social History:  reports that he has quit smoking. His smokeless tobacco use includes Chew. He reports that he does not drink alcohol or use drugs.   ROS: UROLOGY Frequent Urination?: Yes Hard to postpone urination?: No Burning/pain with urination?: No Get up at night to urinate?: Yes Leakage of urine?: Yes Urine stream starts and stops?: Yes Trouble starting stream?: No Do you have to strain to urinate?: No Blood in urine?: No Urinary tract infection?: No Sexually transmitted disease?: No Injury to kidneys or bladder?: No Painful intercourse?: No Weak stream?: No Erection problems?: Yes Penile pain?: No Gastrointestinal Nausea?: No Vomiting?: No Indigestion/heartburn?: No Diarrhea?: No Constipation?: No Constitutional Fever: No Night sweats?: No Weight loss?: Yes Fatigue?: Yes Skin Skin rash/lesions?: No Itching?: No Eyes Blurred vision?: No Double vision?: No Ears/Nose/Throat Sore throat?: No Sinus problems?: No Hematologic/Lymphatic Swollen glands?: No Easy bruising?: No Cardiovascular Leg swelling?: No Chest pain?: No Respiratory Cough?: No Shortness of breath?: Yes Endocrine Excessive thirst?: No Musculoskeletal Back pain?: Yes Joint pain?: Yes Neurological Headaches?: Yes Dizziness?: No Psychologic Depression?: Yes Anxiety?: Yes   Physical Exam: BP (!) 143/89 (BP Location: Left Arm, Patient Position: Sitting, Cuff Size: Normal)   Pulse (!) 108   Ht 5\' 8"  (1.727 m)   Wt 136 lb 12.8 oz (62.1 kg)   BMI 20.80 kg/m   Constitutional:  Alert and oriented, No acute distress. Ambulating with walker.  Presents with wife today HEENT: [place] AT, moist mucus membranes.  Trachea midline, no masses. Cardiovascular: No clubbing, cyanosis, or edema. Respiratory: Normal respiratory effort, no increased work of breathing. GI: Abdomen is soft, nontender, nondistended, no abdominal  masses GU: No CVA tenderness.  30g gland, no nodules.  Normal external sphincter.   No appreciable  Skin: No rashes, bruises or suspicious lesions. Neurologic: Grossly intact, no focal deficits, moving all 4 extremities. Psychiatric: Normal mood and affect.   Labs/ UA: Results for orders placed or performed in visit on 02/08/16  ToxASSURE Select 13 (MW), Urine  Result Value Ref Range   ToxAssure Select 13 FINAL    Component     Latest Ref Rng 08/06/2015 12/04/2015  PSA     0.0 - 4.0 ng/mL 6.8 (H) 7.5 (H)    Assessment & Plan:   1. Prostate cancer  Diagnosed in 01/2014 with T1c Gleason 3+3 prostate cancer in 2/12 cores, 1-3% and single core HGPIN dx 01/2014. PSA at time of dx 5.41. Repeat bx 01/2015,  Single focus of atypical cells but no evidence of clinically signficant cancer despite rising PSA to 7.0.  PSA today/ normal DRE. Recommend continuation of active surveillance.   Will contact patient via mychart with PSA results.  -plan for PSA/ DRE in 6 months if stable today  2. Other male erectile dysfunction Continue Caverject 10 mcg - refilled today  3. LUTS/ urinary frequency Behavioral modification previously discussed Continue flomax   Return in about 6 months (around 10/22/2016) for PSA/ DRE.  Hollice Espy, MD  [facility] [address], [place], [place] [address] [phone]

## 2016-04-22 LAB — PSA: PROSTATE SPECIFIC AG, SERUM: 6.8 ng/mL — AB (ref 0.0–4.0)

## 2016-04-25 ENCOUNTER — Telehealth: Payer: Self-pay | Admitting: Family Medicine

## 2016-04-25 NOTE — Telephone Encounter (Signed)
Left message on machine for patient to call back.

## 2016-04-25 NOTE — Telephone Encounter (Signed)
-----   Message from Hollice Espy, MD sent at 04/22/2016  8:32 AM EDT ----- PSA looks great!  Stable.  Please f/u in 6 months as scheduled.    Hollice Espy, MD

## 2016-04-27 NOTE — Telephone Encounter (Signed)
LMOM for patient to return call from office.

## 2016-04-28 NOTE — Telephone Encounter (Signed)
Notified pt's wife of stable PSA & to f/u in 6 months as scheduled. Wife voices understanding.

## 2016-04-28 NOTE — Telephone Encounter (Signed)
LMOM

## 2016-04-30 ENCOUNTER — Other Ambulatory Visit: Payer: Self-pay | Admitting: Urology

## 2016-04-30 MED ORDER — ALPROSTADIL (VASODILATOR) 10 MCG IC KIT: 10.0000 ug | PACK | INTRACAVERNOUS | 12 refills | Status: DC | PRN

## 2016-05-24 ENCOUNTER — Encounter: Payer: Self-pay | Admitting: Physical Therapy

## 2016-05-24 ENCOUNTER — Ambulatory Visit: Payer: BLUE CROSS/BLUE SHIELD | Attending: Neurology | Admitting: Physical Therapy

## 2016-05-24 DIAGNOSIS — G478 Other sleep disorders: Secondary | ICD-10-CM | POA: Insufficient documentation

## 2016-05-24 NOTE — Therapy (Signed)
[place] MAIN [facility] SERVICES [address] [place], [place], [address] Phone: [phone]   Fax:  [phone]  Physical Therapy Evaluation  Patient Details  Name: Mark Hodges MRN: [id number] Date of Birth: [birth year] Referring Provider: Anabel Bene  Encounter Date: 05/24/2016      PT End of Session - 05/24/16 1119    Visit Number 1   Number of Visits 17   Date for PT Re-Evaluation 07/19/16   Authorization Type blue cross blue shield   PT Start Time 1100   PT Stop Time 1200   PT Time Calculation (min) 60 min   Equipment Utilized During Treatment Gait belt   Activity Tolerance Patient limited by pain      Past Medical History:  Diagnosis Date  . Arthritis   . BPH (benign prostatic hyperplasia)   . Clinical depression 10/23/2014  . Depression   . Erectile dysfunction   . Hyperlipidemia   . Hypogonadism in male   . Lower urinary tract infection   . Premature ejaculation   . Prostate cancer ([place])   . Stroke ([place])   . TIA (transient ischemic attack)     Past Surgical History:  Procedure Laterality Date  . COLONOSCOPY WITH PROPOFOL N/A 01/26/2016   Procedure: COLONOSCOPY WITH PROPOFOL;  Surgeon: Lollie Sails, MD;  Location: [facility] ENDOSCOPY;  Service: Endoscopy;  Laterality: N/A;  . ESOPHAGOGASTRODUODENOSCOPY (EGD) WITH PROPOFOL N/A 01/26/2016   Procedure: ESOPHAGOGASTRODUODENOSCOPY (EGD) WITH PROPOFOL;  Surgeon: Lollie Sails, MD;  Location: [facility] ENDOSCOPY;  Service: Endoscopy;  Laterality: N/A;  . TONSILLECTOMY      There were no vitals filed for this visit.       Subjective Assessment - 05/24/16 1109    Subjective Patient reports that his MD recommended to try PT to improve his balance   Patient is accompained by: Family member   Pertinent History Cervical spondylosisLumbar spondylosis see History above   Patient Stated Goals to walk better   Currently in Pain? Yes   Pain Score 7    Pain Location Hip    Pain Descriptors / Indicators Aching   Multiple Pain Sites Yes  hip and back            [facility] PT Assessment - 05/24/16 0001      Assessment   Medical Diagnosis difficulty walking   Referring Provider Gurney Maxin E   Onset Date/Surgical Date 04/19/16   Hand Dominance Right   Prior Therapy yes     Precautions   Precautions Fall   Precaution Comments falls     Restrictions   Weight Bearing Restrictions No     Balance Screen   Has the patient fallen in the past 6 months Yes   How many times? 5   Has the patient had a decrease in activity level because of a fear of falling?  Yes   Is the patient reluctant to leave their home because of a fear of falling?  Yes     [place] residence   Living Arrangements Spouse/significant other   Available Help at Discharge Family   Type of [place] Access Level entry   Home Layout One level   [place] - 2 wheels     Prior Function   Level of Independence Independent with household mobility with device;Requires assistive device for independence   Vocation Other (comment)  has not worked since 1/15   Leisure  yard work,      Charity fundraiser Status Within Functional Limits for tasks assessed   Attention Focused   Focused Attention Appears intact       PAIN: reports pain in back and hips 5/10 to 8/10 that is chronic  POSTURE: WFL PROM/AROM: WFL BUE and BLE  STRENGTH:  Graded on a 0-5 scale Muscle Group Left Right  Shoulder flex    Shoulder Abd    Shoulder Ext    Shoulder IR/ER    Elbow    Wrist/hand    Hip Flex 4/5 4/5  Hip Abd 4/5 4/5  Hip Add    Hip Ext    Hip IR/ER    Knee Flex 4/5 4/5  Knee Ext 4/5 4/5  Ankle DF 4/5 4/5  Ankle PF     SENSATION: Reports LLE numbness knee to ankle  FUNCTIONAL MOBILITY: guarded and slow, indpenedent  BALANCE:unable to tandem stand and unable to single leg stand  GAIT: Ambulates with RW with uneven  stepping pattern, stopping/starting gait, using  various speeds, readjusting the AD, raising the AD up off the floor while turning 180 deg . Patient needs a rest period after 200 feet of ambulation due to fatigue. OUTCOME MEASURES: TEST Outcome Interpretation  5 times sit<>stand 35.20sec >86 yo, >15 sec indicates increased risk for falls  10 meter walk test    .41             m/s <1.0 m/s indicates increased risk for falls; limited community ambulator  Timed up and Go      24.28           sec <14 sec indicates increased risk for falls  6 minute walk test                400 Feet 1000 feet is community ambulator                                 PT Education - 05/24/16 1119    Education provided Yes   Education Details plan of care   Person(s) Educated Patient   Methods Explanation   Comprehension Verbalized understanding             PT Long Term Goals - 05/24/16 1217      PT LONG TERM GOAL #1   Title Patient will be independent with HEP and improve his functional ambulation .   Time 8   Period Weeks   Status New     PT LONG TERM GOAL #2   Title Patient will improve his 5 x  sit to stand to indicate decreased falls risk.   Time 8   Period Weeks   Status New     PT LONG TERM GOAL #3   Title Patient will improve his 38 MW test to indicate decreased falls risk.    Time 8   Period Weeks   Status New     PT LONG TERM GOAL #4   Title Patient will improve 6 MW test to 1000 feet in order to be a community ambulator   Time 8   Period Weeks   Status New     PT LONG TERM GOAL #5   Title Patient will increase BLE strength to 4+/5 to improve overall mobility and decrease his falls risk.   Time 8   Period Weeks   Status New  Plan - 09-Jun-2016 1121    Clinical Impression Statement Patient is 60 yr old male who presents with history of falls and decreased balance. He has pain in back and right hip many co morbidities. He has difficulty with  walking using RW and  is limited by back and hip pain.    Rehab Potential Fair   PT Frequency 2x / week   PT Duration 4 weeks   PT Treatment/Interventions Manual techniques;Patient/family education;Neuromuscular re-education;Balance training;Therapeutic exercise;Therapeutic activities;Moist Heat;Gait training   PT Next Visit Plan balance and strengthening of LE   PT Home Exercise Plan LE exercises   Consulted and Agree with Plan of Care Patient      Patient will benefit from skilled therapeutic intervention in order to improve the following deficits and impairments:  Decreased cognition, Abnormal gait, Difficulty walking, Cardiopulmonary status limiting activity, Decreased endurance, Decreased activity tolerance, Pain, Decreased balance, Impaired flexibility, Decreased mobility, Decreased strength, Impaired sensation  Visit Diagnosis: Difficulty waking      G-Codes - 09-Jun-2016 1222    Functional Assessment Tool Used TUG, 5 x sit to stand, 10 MW, 6 MW   Functional Limitation Mobility: Walking and moving around   Mobility: Walking and Moving Around Current Status [phone]) At least 40 percent but less than 60 percent impaired, limited or restricted   Mobility: Walking and Moving Around Goal Status [phone]) At least 20 percent but less than 40 percent impaired, limited or restricted       Problem List Patient Active Problem List   Diagnosis Date Noted  . Osteoarthritis of hips (Bilateral) 02/08/2016  . Lumbar facet syndrome (Location of Primary Source of Pain) (Bilateral) (R>L) 02/08/2016  . Abnormal MRI, thoracic spine (07/23/2015) 11/19/2015  .  osteoarthritis of hip (Right) 11/03/2015  . Chronic lower extremity pain (Right) 10/29/2015  . History of prostate cancer 10/29/2015  . Lumbar facet arthropathy 10/29/2015  . 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) 10/29/2015  . Neuropathic pain 10/28/2015  . 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  Substance use disorder Risk: LOW 07/29/2015  . Chronic upper back pain 07/29/2015  . Cervical spondylosis 07/29/2015  . Chronic neck pain 07/29/2015  . Cervicogenic headache 07/29/2015  . Chronic radicular cervical pain 07/29/2015  . Chronic hip pain (Location of Secondary source of pain) (Right) 07/29/2015  . Generalized anxiety disorder 07/29/2015  . Depression 07/29/2015  . At high risk for falls 07/29/2015  . Erectile dysfunction 07/29/2015  . History of TIA (transient ischemic attack) 07/29/2015  . Syrinx of spinal cord ([place]) from T7-8 through T9-10 without associated mass lesion or cord expansion. 07/29/2015  . Abnormal MRI, lumbar spine (06/26/2015) 07/29/2015  . Lumbar spondylosis 07/29/2015  . Long term current use of opiate analgesic 07/28/2015  . Long term prescription opiate use 07/28/2015  . Opiate use (15 MME/Day) 07/28/2015  . Opiate dependence ([place]) 07/28/2015  . Encounter for therapeutic drug level monitoring 07/28/2015  . Chronic pain 07/28/2015  . Chronic low back pain (Location of Primary Source of Pain) (Bilateral) (R>L) 07/28/2015  . Chronic lumbar radicular pain (Polyradiculopathy) (Right) 07/28/2015  . Ataxia 07/28/2015  . Arm numbness 05/29/2015  . HLD (hyperlipidemia) 04/23/2015  . Difficulty in walking 12/16/2014  . Difficulty in walking, not elsewhere classified 12/16/2014  . Compulsive tobacco user syndrome 10/23/2014  . Hypercholesterolemia without hypertriglyceridemia 10/23/2014  . Current tobacco use 10/23/2014  . Pure hypercholesterolemia 10/23/2014  . Clinical depression 10/23/2014  . Mild chronic obstructive pulmonary disease ([place]) 06/18/2014  Alanson Puls, PT, DPT [place], [place] S 05/24/2016, 1:40 PM  [place] MAIN [facility] SERVICES [address] [place], [place], [address] Phone: [phone]   Fax:  [phone]  Name: Mark Hodges MRN: [id number] Date of Birth: 08-07-1956

## 2016-06-01 ENCOUNTER — Ambulatory Visit
Admission: RE | Admit: 2016-06-01 | Discharge: 2016-06-01 | Disposition: A | Discharge status: Home / Self Care | Payer: BLUE CROSS/BLUE SHIELD | Source: Ambulatory Visit | Attending: Internal Medicine | Admitting: Internal Medicine

## 2016-06-01 ENCOUNTER — Ambulatory Visit: Payer: BLUE CROSS/BLUE SHIELD | Attending: Neurology | Admitting: Physical Therapy

## 2016-06-01 ENCOUNTER — Other Ambulatory Visit: Payer: Self-pay | Admitting: Internal Medicine

## 2016-06-01 ENCOUNTER — Encounter: Payer: Self-pay | Admitting: Physical Therapy

## 2016-06-01 DIAGNOSIS — J449 Chronic obstructive pulmonary disease, unspecified: Secondary | ICD-10-CM | POA: Diagnosis not present

## 2016-06-01 DIAGNOSIS — R0602 Shortness of breath: Secondary | ICD-10-CM | POA: Insufficient documentation

## 2016-06-01 DIAGNOSIS — G478 Other sleep disorders: Secondary | ICD-10-CM | POA: Insufficient documentation

## 2016-06-01 NOTE — Therapy (Signed)
[place] MAIN [facility] SERVICES [address] [place], [place], [address] Phone: [phone]   Fax:  [phone]  Physical Therapy Treatment  Patient Details  Name: Mark Hodges MRN: [id number] Date of Birth: [birth year] Referring Provider: Anabel Bene  Encounter Date: 06/01/2016      PT End of Session - 06/01/16 1057    Visit Number 2   Number of Visits 17   Date for PT Re-Evaluation 07/19/16   Authorization Type blue cross blue shield   PT Start Time 1100   PT Stop Time 1145   PT Time Calculation (min) 45 min   Equipment Utilized During Treatment Gait belt   Activity Tolerance Patient limited by pain   Behavior During Therapy [facility] for tasks assessed/performed      Past Medical History:  Diagnosis Date  . Arthritis   . BPH (benign prostatic hyperplasia)   . Clinical depression 10/23/2014  . Depression   . Erectile dysfunction   . Hyperlipidemia   . Hypogonadism in male   . Lower urinary tract infection   . Premature ejaculation   . Prostate cancer ([place])   . Stroke ([place])   . TIA (transient ischemic attack)     Past Surgical History:  Procedure Laterality Date  . COLONOSCOPY WITH PROPOFOL N/A 01/26/2016   Procedure: COLONOSCOPY WITH PROPOFOL;  Surgeon: Lollie Sails, MD;  Location: [facility] ENDOSCOPY;  Service: Endoscopy;  Laterality: N/A;  . ESOPHAGOGASTRODUODENOSCOPY (EGD) WITH PROPOFOL N/A 01/26/2016   Procedure: ESOPHAGOGASTRODUODENOSCOPY (EGD) WITH PROPOFOL;  Surgeon: Lollie Sails, MD;  Location: [facility] ENDOSCOPY;  Service: Endoscopy;  Laterality: N/A;  . TONSILLECTOMY      There were no vitals filed for this visit.      Subjective Assessment - 06/01/16 1111    Subjective Patient reports that his pain is 5/10 and this is a good day for him.   Patient is accompained by: Family member   Pertinent History Cervical spondylosisLumbar spondylosis see History above   Patient Stated Goals to walk better   Currently in  Pain? Yes   Pain Score 5    Pain Location Back   Pain Descriptors / Indicators Aching   Pain Type Chronic pain   Pain Onset More than a month ago      Therapeutic exercise and NMR: TM walking on elevation 2 at speed of 1. 0 miles / hours and holding with 2 hands x 3 minutes, x 7 minutes with single arm support and both UE support Patient is taking deep breaths and is breathing heavy and has irregular stepping on TM with several small steps and large upper body sway movements with walking. O2 saturation is 93% following 10 minutes of ambulation on TM.  Step ups from blue foam to 6 inch step with irregular balance deficits and legs buckling slightly, trunk flaring out and stumbling on blue foam with staggering movements with CGA Feet together on blue foam and head turns left and right and up and down with huge trunk sway and deviations in posture with CGA  Side step ups left and right from floor to standing x 10 left and right with CGA Fwd step ups on 1 step x 20 from floor with CGA Leg press with 100 lbs x 20 x 3 sets  Patient ambulates into the clinic independently with rollator and gait speed of 1.0 mile/ hour with no gait deviations. However once patient is using the TM is gait pattern is alternating with staggering  steps, small short steps, exaggerated trunk sway and shoulders rocking left and right on the TM.  When patient is distracted and talking about his new dx of emphysema he is able to ambulate on the TM with single hand and no gait deviations.  Patient requires 3 short rest periods during treatment. Patient has uncommon balance deviations during TM walking and beginning static and dynamic balance challenges. Patients deviations include: large trunk sway motions, several small jumps on single leg on blue foam during step up exercise, staggering and jolting motions with use of UE to catch and steady himself one time. Patient is able to walk away from the rollator to get on and off machines  and has no gait deviations. He is also able to turn around 180 degs to change positions without gait deviations or loss of balance.  O2 saturation remains above 96% during treatment except during TM walking and it decreased to 93%.                            PT Education - 06/01/16 1056    Education provided Yes   Education Details blance in the corner   Person(s) Educated Patient   Methods Explanation   Comprehension Verbalized understanding             PT Long Term Goals - 05/24/16 1217      PT LONG TERM GOAL #1   Title Patient will be independent with HEP and improve his functional ambulation .   Time 8   Period Weeks   Status New     PT LONG TERM GOAL #2   Title Patient will improve his 5 x  sit to stand to indicate decreased falls risk.   Time 8   Period Weeks   Status New     PT LONG TERM GOAL #3   Title Patient will improve his 73 MW test to indicate decreased falls risk.    Time 8   Period Weeks   Status New     PT LONG TERM GOAL #4   Title Patient will improve 6 MW test to 1000 feet in order to be a community ambulator   Time 8   Period Weeks   Status New     PT LONG TERM GOAL #5   Title Patient will increase BLE strength to 4+/5 to improve overall mobility and decrease his falls risk.   Time 8   Period Weeks   Status New               Plan - 06/01/16 1112    Clinical Impression Statement Pateint reports having back pain 5/10. He is able to perform LE exercises and balance training without increasing his pain behaviors.    Rehab Potential Fair   PT Frequency 2x / week   PT Duration 4 weeks   PT Treatment/Interventions Manual techniques;Patient/family education;Neuromuscular re-education;Balance training;Therapeutic exercise;Therapeutic activities;Moist Heat;Gait training   PT Next Visit Plan balance and strengthening of LE   PT Home Exercise Plan LE exercises   Consulted and Agree with Plan of Care Patient       Patient will benefit from skilled therapeutic intervention in order to improve the following deficits and impairments:  Decreased cognition, Abnormal gait, Difficulty walking, Cardiopulmonary status limiting activity, Decreased endurance, Decreased activity tolerance, Pain, Decreased balance, Impaired flexibility, Decreased mobility, Decreased strength, Impaired sensation  Visit Diagnosis: Difficulty waking     Problem List Patient Active  Problem List   Diagnosis Date Noted  . Osteoarthritis of hips (Bilateral) 02/08/2016  . Lumbar facet syndrome (Location of Primary Source of Pain) (Bilateral) (R>L) 02/08/2016  . Abnormal MRI, thoracic spine (07/23/2015) 11/19/2015  .  osteoarthritis of hip (Right) 11/03/2015  . Chronic lower extremity pain (Right) 10/29/2015  . History of prostate cancer 10/29/2015  . Lumbar facet arthropathy 10/29/2015  . 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) 10/29/2015  . Neuropathic pain 10/28/2015  . 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 Substance use disorder Risk: LOW 07/29/2015  . Chronic upper back pain 07/29/2015  . Cervical spondylosis 07/29/2015  . Chronic neck pain 07/29/2015  . Cervicogenic headache 07/29/2015  . Chronic radicular cervical pain 07/29/2015  . Chronic hip pain (Location of Secondary source of pain) (Right) 07/29/2015  . Generalized anxiety disorder 07/29/2015  . Depression 07/29/2015  . At high risk for falls 07/29/2015  . Erectile dysfunction 07/29/2015  . History of TIA (transient ischemic attack) 07/29/2015  . Syrinx of spinal cord ([place]) from T7-8 through T9-10 without associated mass lesion or cord expansion. 07/29/2015  . Abnormal MRI, lumbar spine (06/26/2015) 07/29/2015  . Lumbar spondylosis 07/29/2015  . Long term current use of opiate analgesic 07/28/2015  . Long term prescription opiate use 07/28/2015  . Opiate use (15 MME/Day) 07/28/2015  . Opiate dependence ([place]) 07/28/2015   . Encounter for therapeutic drug level monitoring 07/28/2015  . Chronic pain 07/28/2015  . Chronic low back pain (Location of Primary Source of Pain) (Bilateral) (R>L) 07/28/2015  . Chronic lumbar radicular pain (Polyradiculopathy) (Right) 07/28/2015  . Ataxia 07/28/2015  . Arm numbness 05/29/2015  . HLD (hyperlipidemia) 04/23/2015  . Difficulty in walking 12/16/2014  . Difficulty in walking, not elsewhere classified 12/16/2014  . Compulsive tobacco user syndrome 10/23/2014  . Hypercholesterolemia without hypertriglyceridemia 10/23/2014  . Current tobacco use 10/23/2014  . Pure hypercholesterolemia 10/23/2014  . Clinical depression 10/23/2014  . Mild chronic obstructive pulmonary disease ([place]) 06/18/2014   Alanson Puls, PT, DPT [place], [place] S 06/01/2016, 11:15 AM  [place] MAIN [facility] SERVICES [address] [place], [place], [address] Phone: [phone]   Fax:  [phone]  Name: Mark Hodges MRN: [id number] Date of Birth: [birth year]

## 2016-06-07 ENCOUNTER — Encounter: Payer: BLUE CROSS/BLUE SHIELD | Admitting: Physical Therapy

## 2016-06-09 ENCOUNTER — Ambulatory Visit: Payer: BLUE CROSS/BLUE SHIELD | Admitting: Physical Therapy

## 2016-06-14 ENCOUNTER — Encounter: Payer: Self-pay | Admitting: Physical Therapy

## 2016-06-14 ENCOUNTER — Ambulatory Visit: Payer: BLUE CROSS/BLUE SHIELD | Admitting: Physical Therapy

## 2016-06-14 VITALS — BP 119/75 | HR 104

## 2016-06-14 DIAGNOSIS — G478 Other sleep disorders: Secondary | ICD-10-CM | POA: Diagnosis not present

## 2016-06-14 NOTE — Therapy (Signed)
[place] MAIN [facility] SERVICES [address] [place], [place], [address] Phone: [phone]   Fax:  [phone]  Physical Therapy Treatment  Patient Details  Name: Mark Hodges MRN: [id number] Date of Birth: [birth year] Referring Provider: Anabel Bene  Encounter Date: 06/14/2016      PT End of Session - 06/14/16 0932    Visit Number 3   Number of Visits 17   Date for PT Re-Evaluation 07/19/16   Authorization Type blue cross blue shield   PT Start Time 0845   PT Stop Time 0930   PT Time Calculation (min) 45 min   Equipment Utilized During Treatment Gait belt   Activity Tolerance Patient tolerated treatment well;No increased pain   Behavior During Therapy WFL for tasks assessed/performed      Past Medical History:  Diagnosis Date  . Arthritis   . BPH (benign prostatic hyperplasia)   . Clinical depression 10/23/2014  . Depression   . Erectile dysfunction   . Hyperlipidemia   . Hypogonadism in male   . Lower urinary tract infection   . Premature ejaculation   . Prostate cancer ([place])   . Stroke ([place])   . TIA (transient ischemic attack)     Past Surgical History:  Procedure Laterality Date  . COLONOSCOPY WITH PROPOFOL N/A 01/26/2016   Procedure: COLONOSCOPY WITH PROPOFOL;  Surgeon: Lollie Sails, MD;  Location: [facility] ENDOSCOPY;  Service: Endoscopy;  Laterality: N/A;  . ESOPHAGOGASTRODUODENOSCOPY (EGD) WITH PROPOFOL N/A 01/26/2016   Procedure: ESOPHAGOGASTRODUODENOSCOPY (EGD) WITH PROPOFOL;  Surgeon: Lollie Sails, MD;  Location: [facility] ENDOSCOPY;  Service: Endoscopy;  Laterality: N/A;  . TONSILLECTOMY      Vitals:   06/14/16 0841  BP: 119/75  Pulse: (!) 104  SpO2: 100%        Subjective Assessment - 06/14/16 0841    Subjective Patient reports he is short of breath this morning.  He rates his R hip pain a 5/10 and left knee pain a 4/10.     Patient is accompained by: Family member   Pertinent History Cervical  spondylosisLumbar spondylosis see History above   Patient Stated Goals to walk better   Currently in Pain? Yes   Pain Score 5    Pain Location Hip   Pain Orientation Right   Pain Onset More than a month ago       Treatment Nustep x 4 mins BUEs/BLEs level 1 (unbilled), vitals taken after Nustep Straight leg raise and glut bridges deferred due to resultant increase in LBP and L knee pain  SAQ with green bolster, 2 sets x 10 reps BLEs, min VCs for 3 second isometric hold   Hooklying abduction with red theraband resistance, 2 sets x 10 reps, min VCs for initial positioning, pt able to progress to red theraband resistance without increase in pain Hooklying adduction with green ball, 2 sets x 10 reps with 3 second isometric hold, min VCs for isometric holds  Seated hamstring curl with yellow theraband resistance, 2 sets x 10 reps BLEs, min VCs for increased knee extension  Leg Press, #90, BLEs, 2 sets x 10 reps, min VCs for eccentric control Leg Press, #60, single leg, 2 sets x 10 reps BLEs, min VCs to decrease terminal knee extension Sidestepping along airex balance beam x 4 laps, CGA for safety, pt demonstrated inconsistent step length throughout varying from small choppy steps to larger normal steps  Tandem walking forwards/backwards along airex balance beam x 4 laps, 1  HHA, CGA for safety,several posterior LOB corrected by PT                             PT Education - 06/14/16 0931    Education provided Yes   Education Details supine HEP    Person(s) Educated Patient   Methods Explanation;Demonstration;Verbal cues   Comprehension Verbalized understanding;Returned demonstration;Verbal cues required             PT Long Term Goals - 05/24/16 1217      PT LONG TERM GOAL #1   Title Patient will be independent with HEP and improve his functional ambulation .   Time 8   Period Weeks   Status New     PT LONG TERM GOAL #2   Title Patient will improve his 5 x   sit to stand to indicate decreased falls risk.   Time 8   Period Weeks   Status New     PT LONG TERM GOAL #3   Title Patient will improve his 22 MW test to indicate decreased falls risk.    Time 8   Period Weeks   Status New     PT LONG TERM GOAL #4   Title Patient will improve 6 MW test to 1000 feet in order to be a community ambulator   Time 8   Period Weeks   Status New     PT LONG TERM GOAL #5   Title Patient will increase BLE strength to 4+/5 to improve overall mobility and decrease his falls risk.   Time 8   Period Weeks   Status New               Plan - 06/14/16 0933    Clinical Impression Statement Pt reports having 4/10 R hip pain and 5/10 left knee pain that did not increase after therapy sesssion.  Pt reported some shortness of breath despite stable vitals and was able to carry out conversations during exercise.  Pt tolerated supine and seated ther ex without an increase in hip or knee pain with proper form.  Pt demonstrates some posterior sway and choppy steps when performing dynamic balance activites on airex balance beam and step taps on bosu ball requiring CGA and 1 HHA for stability.  He would benefit from skilled PT to increase his LE strength, endurance, and balance to decrease falls and increase functional mobility.     Rehab Potential Fair   PT Frequency 2x / week   PT Duration 4 weeks   PT Treatment/Interventions Manual techniques;Patient/family education;Neuromuscular re-education;Balance training;Therapeutic exercise;Therapeutic activities;Moist Heat;Gait training   PT Next Visit Plan balance and strengthening of LE   PT Home Exercise Plan LE exercises   Consulted and Agree with Plan of Care Patient      Patient will benefit from skilled therapeutic intervention in order to improve the following deficits and impairments:  Decreased cognition, Abnormal gait, Difficulty walking, Cardiopulmonary status limiting activity, Decreased endurance, Decreased  activity tolerance, Pain, Decreased balance, Impaired flexibility, Decreased mobility, Decreased strength, Impaired sensation  Visit Diagnosis: Difficulty waking     Problem List Patient Active Problem List   Diagnosis Date Noted  . Osteoarthritis of hips (Bilateral) 02/08/2016  . Lumbar facet syndrome (Location of Primary Source of Pain) (Bilateral) (R>L) 02/08/2016  . Abnormal MRI, thoracic spine (07/23/2015) 11/19/2015  .  osteoarthritis of hip (Right) 11/03/2015  . Chronic lower extremity pain (Right) 10/29/2015  . History of  prostate cancer 10/29/2015  . Lumbar facet arthropathy 10/29/2015  . 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) 10/29/2015  . Neuropathic pain 10/28/2015  . 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 Substance use disorder Risk: LOW 07/29/2015  . Chronic upper back pain 07/29/2015  . Cervical spondylosis 07/29/2015  . Chronic neck pain 07/29/2015  . Cervicogenic headache 07/29/2015  . Chronic radicular cervical pain 07/29/2015  . Chronic hip pain (Location of Secondary source of pain) (Right) 07/29/2015  . Generalized anxiety disorder 07/29/2015  . Depression 07/29/2015  . At high risk for falls 07/29/2015  . Erectile dysfunction 07/29/2015  . History of TIA (transient ischemic attack) 07/29/2015  . Syrinx of spinal cord ([place]) from T7-8 through T9-10 without associated mass lesion or cord expansion. 07/29/2015  . Abnormal MRI, lumbar spine (06/26/2015) 07/29/2015  . Lumbar spondylosis 07/29/2015  . Long term current use of opiate analgesic 07/28/2015  . Long term prescription opiate use 07/28/2015  . Opiate use (15 MME/Day) 07/28/2015  . Opiate dependence ([place]) 07/28/2015  . Encounter for therapeutic drug level monitoring 07/28/2015  . Chronic pain 07/28/2015  . Chronic low back pain (Location of Primary Source of Pain) (Bilateral) (R>L) 07/28/2015  . Chronic lumbar radicular pain (Polyradiculopathy) (Right) 07/28/2015   . Ataxia 07/28/2015  . Arm numbness 05/29/2015  . HLD (hyperlipidemia) 04/23/2015  . Difficulty in walking 12/16/2014  . Difficulty in walking, not elsewhere classified 12/16/2014  . Compulsive tobacco user syndrome 10/23/2014  . Hypercholesterolemia without hypertriglyceridemia 10/23/2014  . Current tobacco use 10/23/2014  . Pure hypercholesterolemia 10/23/2014  . Clinical depression 10/23/2014  . Mild chronic obstructive pulmonary disease ([place]) 06/18/2014   Stacy Gardner, SPT  This entire session was performed under direct supervision and direction of a licensed therapist/therapist assistant . I have personally read, edited and approve of the note as written.  Collie Siad PT, DPT 06/14/2016, 1:26 PM  [place] MAIN [facility] SERVICES [address] [place], [place], [address] Phone: [phone]   Fax:  [phone]  Name: Mark Hodges MRN: [id number] Date of Birth: [birth year]

## 2016-06-16 ENCOUNTER — Ambulatory Visit: Payer: BLUE CROSS/BLUE SHIELD | Admitting: Physical Therapy

## 2016-06-21 ENCOUNTER — Encounter: Payer: Self-pay | Admitting: Physical Therapy

## 2016-06-21 ENCOUNTER — Ambulatory Visit: Payer: BLUE CROSS/BLUE SHIELD | Admitting: Physical Therapy

## 2016-06-21 DIAGNOSIS — G478 Other sleep disorders: Secondary | ICD-10-CM

## 2016-06-21 NOTE — Therapy (Signed)
[place] MAIN [facility] SERVICES [address] [place], [place], [address] Phone: [phone]   Fax:  [phone]  Physical Therapy Treatment  Patient Details  Name: Mark Hodges MRN: [id number] Date of Birth: 17-May-1956 Referring Provider: Anabel Bene  Encounter Date: 06/21/2016      PT End of Session - 06/21/16 1352    Visit Number 4   Number of Visits 17   Date for PT Re-Evaluation 07/19/16   Authorization Type blue cross blue shield   PT Start Time 0145   PT Stop Time 0230   PT Time Calculation (min) 45 min   Equipment Utilized During Treatment Gait belt   Activity Tolerance Patient tolerated treatment well;No increased pain   Behavior During Therapy WFL for tasks assessed/performed      Past Medical History:  Diagnosis Date  . Arthritis   . BPH (benign prostatic hyperplasia)   . Clinical depression 10/23/2014  . Depression   . Erectile dysfunction   . Hyperlipidemia   . Hypogonadism in male   . Lower urinary tract infection   . Premature ejaculation   . Prostate cancer ([place])   . Stroke ([place])   . TIA (transient ischemic attack)     Past Surgical History:  Procedure Laterality Date  . COLONOSCOPY WITH PROPOFOL N/A 01/26/2016   Procedure: COLONOSCOPY WITH PROPOFOL;  Surgeon: Lollie Sails, MD;  Location: [facility] ENDOSCOPY;  Service: Endoscopy;  Laterality: N/A;  . ESOPHAGOGASTRODUODENOSCOPY (EGD) WITH PROPOFOL N/A 01/26/2016   Procedure: ESOPHAGOGASTRODUODENOSCOPY (EGD) WITH PROPOFOL;  Surgeon: Lollie Sails, MD;  Location: [facility] ENDOSCOPY;  Service: Endoscopy;  Laterality: N/A;  . TONSILLECTOMY      There were no vitals filed for this visit.      Subjective Assessment - 06/21/16 1351    Subjective Patient reports he is short of breath this morning.  He rates his R hip pain a 6/10 and back pain 4/10.     Patient is accompained by: Family member   Pertinent History Cervical spondylosisLumbar spondylosis see  History above   Patient Stated Goals to walk better   Currently in Pain? Yes   Pain Score 6    Pain Location Hip   Pain Orientation Right   Pain Type Chronic pain   Pain Onset More than a month ago   Multiple Pain Sites Yes  back 4/10     Therapeutic exercise and NMR: Nu-step x 5 minutes Level 1 x 5 minutes( patient takes several short rest periods during the warm up) Standing on 1/2 foam flat side up and curve side up  with frequent loss of balance posteriorly Rocker board tapping fwd/bwd with UE support and tapping side to side with UE support Leg press 130 lbs x 20 x 3, heel raises x 20 x 3 TM walking x 5 minutes at 1.5 miles/ hour Patient appears restless and agitatied when he needs to use his hands to get his balance during balance training exercises. His breathing is  heavy short breaths and constantly repositioning his feet on the 1/2 foam. He moves around often doing odd stretches during the leg press exercise and on the TM has odd :gait skips" and"umps" and changing his leg step length often to have a very inconsistent gait pattern. He is able to negotiate the leg press machine on and off with out use of UE for balance.  PT Education - 06/21/16 1352    Education provided Yes   Education Details HEP   Person(s) Educated Patient   Methods Explanation   Comprehension Verbalized understanding             PT Long Term Goals - 05/24/16 1217      PT LONG TERM GOAL #1   Title Patient will be independent with HEP and improve his functional ambulation .   Time 8   Period Weeks   Status New     PT LONG TERM GOAL #2   Title Patient will improve his 5 x  sit to stand to indicate decreased falls risk.   Time 8   Period Weeks   Status New     PT LONG TERM GOAL #3   Title Patient will improve his 2 MW test to indicate decreased falls risk.    Time 8   Period Weeks   Status New     PT LONG TERM GOAL #4   Title Patient  will improve 6 MW test to 1000 feet in order to be a community ambulator   Time 8   Period Weeks   Status New     PT LONG TERM GOAL #5   Title Patient will increase BLE strength to 4+/5 to improve overall mobility and decrease his falls risk.   Time 8   Period Weeks   Status New               Plan - 06/21/16 1353    Clinical Impression Statement Patient performs LE strengthening and standing balance exercises. He is self limiting wiht frequent short breaks during the warm up on the nu-step. Patient ambulates independently wiht RW wihtout gait deviations into the PT clinic and has multiple gait deviations when moving around in the clinic.    Rehab Potential Fair   PT Frequency 2x / week   PT Duration 4 weeks   PT Treatment/Interventions Manual techniques;Patient/family education;Neuromuscular re-education;Balance training;Therapeutic exercise;Therapeutic activities;Moist Heat;Gait training   PT Next Visit Plan balance and strengthening of LE   PT Home Exercise Plan LE exercises   Consulted and Agree with Plan of Care Patient      Patient will benefit from skilled therapeutic intervention in order to improve the following deficits and impairments:  Decreased cognition, Abnormal gait, Difficulty walking, Cardiopulmonary status limiting activity, Decreased endurance, Decreased activity tolerance, Pain, Decreased balance, Impaired flexibility, Decreased mobility, Decreased strength, Impaired sensation  Visit Diagnosis: Difficulty waking     Problem List Patient Active Problem List   Diagnosis Date Noted  . Osteoarthritis of hips (Bilateral) 02/08/2016  . Lumbar facet syndrome (Location of Primary Source of Pain) (Bilateral) (R>L) 02/08/2016  . Abnormal MRI, thoracic spine (07/23/2015) 11/19/2015  .  osteoarthritis of hip (Right) 11/03/2015  . Chronic lower extremity pain (Right) 10/29/2015  . History of prostate cancer 10/29/2015  . Lumbar facet arthropathy 10/29/2015  .  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) 10/29/2015  . Neuropathic pain 10/28/2015  . 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 Substance use disorder Risk: LOW 07/29/2015  . Chronic upper back pain 07/29/2015  . Cervical spondylosis 07/29/2015  . Chronic neck pain 07/29/2015  . Cervicogenic headache 07/29/2015  . Chronic radicular cervical pain 07/29/2015  . Chronic hip pain (Location of Secondary source of pain) (Right) 07/29/2015  . Generalized anxiety disorder 07/29/2015  . Depression 07/29/2015  . At high risk for falls 07/29/2015  . Erectile dysfunction 07/29/2015  .  History of TIA (transient ischemic attack) 07/29/2015  . Syrinx of spinal cord ([place]) from T7-8 through T9-10 without associated mass lesion or cord expansion. 07/29/2015  . Abnormal MRI, lumbar spine (06/26/2015) 07/29/2015  . Lumbar spondylosis 07/29/2015  . Long term current use of opiate analgesic 07/28/2015  . Long term prescription opiate use 07/28/2015  . Opiate use (15 MME/Day) 07/28/2015  . Opiate dependence ([place]) 07/28/2015  . Encounter for therapeutic drug level monitoring 07/28/2015  . Chronic pain 07/28/2015  . Chronic low back pain (Location of Primary Source of Pain) (Bilateral) (R>L) 07/28/2015  . Chronic lumbar radicular pain (Polyradiculopathy) (Right) 07/28/2015  . Ataxia 07/28/2015  . Arm numbness 05/29/2015  . HLD (hyperlipidemia) 04/23/2015  . Difficulty in walking 12/16/2014  . Difficulty in walking, not elsewhere classified 12/16/2014  . Compulsive tobacco user syndrome 10/23/2014  . Hypercholesterolemia without hypertriglyceridemia 10/23/2014  . Current tobacco use 10/23/2014  . Pure hypercholesterolemia 10/23/2014  . Clinical depression 10/23/2014  . Mild chronic obstructive pulmonary disease ([place]) 06/18/2014  Alanson Puls, PT, DPT [place], [place] 06/21/2016, 1:55 PM  [place] MAIN [facility] SERVICES [address] [place], [place], [address] Phone: [phone]   Fax:  [phone]  Name: Mark Hodges MRN: [id number] Date of Birth: Mar 16, 1956

## 2016-06-27 ENCOUNTER — Ambulatory Visit: Payer: BLUE CROSS/BLUE SHIELD | Admitting: Physical Therapy

## 2016-06-27 ENCOUNTER — Encounter: Payer: Self-pay | Admitting: Pain Medicine

## 2016-06-27 ENCOUNTER — Ambulatory Visit: Payer: BLUE CROSS/BLUE SHIELD | Attending: Pain Medicine | Admitting: Pain Medicine

## 2016-06-27 VITALS — BP 109/91 | HR 98 | Temp 98.2°F | Resp 20 | Ht 65.0 in | Wt 145.0 lb

## 2016-06-27 DIAGNOSIS — M47816 Spondylosis without myelopathy or radiculopathy, lumbar region: Secondary | ICD-10-CM

## 2016-06-27 DIAGNOSIS — M1288 Other specific arthropathies, not elsewhere classified, other specified site: Secondary | ICD-10-CM | POA: Diagnosis not present

## 2016-06-27 DIAGNOSIS — M792 Neuralgia and neuritis, unspecified: Secondary | ICD-10-CM | POA: Diagnosis not present

## 2016-06-27 DIAGNOSIS — G8929 Other chronic pain: Secondary | ICD-10-CM | POA: Diagnosis not present

## 2016-06-27 DIAGNOSIS — Z79891 Long term (current) use of opiate analgesic: Secondary | ICD-10-CM | POA: Insufficient documentation

## 2016-06-27 DIAGNOSIS — M545 Low back pain: Secondary | ICD-10-CM | POA: Diagnosis not present

## 2016-06-27 DIAGNOSIS — Z87891 Personal history of nicotine dependence: Secondary | ICD-10-CM | POA: Diagnosis not present

## 2016-06-27 DIAGNOSIS — Z7982 Long term (current) use of aspirin: Secondary | ICD-10-CM | POA: Diagnosis not present

## 2016-06-27 DIAGNOSIS — F1190 Opioid use, unspecified, uncomplicated: Secondary | ICD-10-CM | POA: Insufficient documentation

## 2016-06-27 MED ORDER — OXYCODONE-ACETAMINOPHEN 10-325 MG PO TABS: 1.0000 | ORAL_TABLET | Freq: Every day | ORAL | 0 refills | Status: DC | PRN

## 2016-06-27 MED ORDER — GABAPENTIN 300 MG PO CAPS: 900.0000 mg | ORAL_CAPSULE | Freq: Three times a day (TID) | ORAL | 0 refills | Status: DC

## 2016-06-27 NOTE — Patient Instructions (Signed)
GENERAL RISKS AND COMPLICATIONS  What are the risk, side effects and possible complications? Generally speaking, most procedures are safe.  However, with any procedure there are risks, side effects, and the possibility of complications.  The risks and complications are dependent upon the sites that are lesioned, or the type of nerve block to be performed.  The closer the procedure is to the spine, the more serious the risks are.  Great care is taken when placing the radio frequency needles, block needles or lesioning probes, but sometimes complications can occur. 1. Infection: Any time there is an injection through the skin, there is a risk of infection.  This is why sterile conditions are used for these blocks.  There are four possible types of infection. 1. Localized skin infection. 2. Central Nervous System Infection-This can be in the form of Meningitis, which can be deadly. 3. Epidural Infections-This can be in the form of an epidural abscess, which can cause pressure inside of the spine, causing compression of the spinal cord with subsequent paralysis. This would require an emergency surgery to decompress, and there are no guarantees that the patient would recover from the paralysis. 4. Discitis-This is an infection of the intervertebral discs.  It occurs in about 1% of discography procedures.  It is difficult to treat and it may lead to surgery.        2. Pain: the needles have to go through skin and soft tissues, will cause soreness.       3. Damage to internal structures:  The nerves to be lesioned may be near blood vessels or    other nerves which can be potentially damaged.       4. Bleeding: Bleeding is more common if the patient is taking blood thinners such as  aspirin, Coumadin, Ticiid, Plavix, etc., or if he/she have some genetic predisposition  such as hemophilia. Bleeding into the spinal canal can cause compression of the spinal  cord with subsequent paralysis.  This would require an  emergency surgery to  decompress and there are no guarantees that the patient would recover from the  paralysis.       5. Pneumothorax:  Puncturing of a lung is a possibility, every time a needle is introduced in  the area of the chest or upper back.  Pneumothorax refers to free air around the  collapsed lung(s), inside of the thoracic cavity (chest cavity).  Another two possible  complications related to a similar event would include: Hemothorax and Chylothorax.   These are variations of the Pneumothorax, where instead of air around the collapsed  lung(s), you may have blood or chyle, respectively.       6. Spinal headaches: They may occur with any procedures in the area of the spine.       7. Persistent CSF (Cerebro-Spinal Fluid) leakage: This is a rare problem, but may occur  with prolonged intrathecal or epidural catheters either due to the formation of a fistulous  track or a dural tear.       8. Nerve damage: By working so close to the spinal cord, there is always a possibility of  nerve damage, which could be as serious as a permanent spinal cord injury with  paralysis.       9. Death:  Although rare, severe deadly allergic reactions known as "Anaphylactic  reaction" can occur to any of the medications used.      10. Worsening of the symptoms:  We can always make thing worse.    What are the chances of something like this happening? Chances of any of this occuring are extremely low.  By statistics, you have more of a chance of getting killed in a motor vehicle accident: while driving to the hospital than any of the above occurring .  Nevertheless, you should be aware that they are possibilities.  In general, it is similar to taking a shower.  Everybody knows that you can slip, hit your head and get killed.  Does that mean that you should not shower again?  Nevertheless always keep in mind that statistics do not mean anything if you happen to be on the wrong side of them.  Even if a procedure has a 1  (one) in a 1,000,000 (million) chance of going wrong, it you happen to be that one..Also, keep in mind that by statistics, you have more of a chance of having something go wrong when taking medications.  Who should not have this procedure? If you are on a blood thinning medication (e.g. Coumadin, Plavix, see list of "Blood Thinners"), or if you have an active infection going on, you should not have the procedure.  If you are taking any blood thinners, please inform your physician.  How should I prepare for this procedure?  Do not eat or drink anything at least six hours prior to the procedure.  Bring a driver with you .  It cannot be a taxi.  Come accompanied by an adult that can drive you back, and that is strong enough to help you if your legs get weak or numb from the local anesthetic.  Take all of your medicines the morning of the procedure with just enough water to swallow them.  If you have diabetes, make sure that you are scheduled to have your procedure done first thing in the morning, whenever possible.  If you have diabetes, take only half of your insulin dose and notify our nurse that you have done so as soon as you arrive at the clinic.  If you are diabetic, but only take blood sugar pills (oral hypoglycemic), then do not take them on the morning of your procedure.  You may take them after you have had the procedure.  Do not take aspirin or any aspirin-containing medications, at least eleven (11) days prior to the procedure.  They may prolong bleeding.  Wear loose fitting clothing that may be easy to take off and that you would not mind if it got stained with Betadine or blood.  Do not wear any jewelry or perfume  Remove any nail coloring.  It will interfere with some of our monitoring equipment.  NOTE: Remember that this is not meant to be interpreted as a complete list of all possible complications.  Unforeseen problems may occur.  BLOOD THINNERS The following drugs  contain aspirin or other products, which can cause increased bleeding during surgery and should not be taken for 2 weeks prior to and 1 week after surgery.  If you should need take something for relief of minor pain, you may take acetaminophen which is found in Tylenol,m Datril, Anacin-3 and Panadol. It is not blood thinner. The products listed below are.  Do not take any of the products listed below in addition to any listed on your instruction sheet.  A.P.C or A.P.C with Codeine Codeine Phosphate Capsules #3 Ibuprofen Ridaura  ABC compound Congesprin Imuran rimadil  Advil Cope Indocin Robaxisal  Alka-Seltzer Effervescent Pain Reliever and Antacid Coricidin or Coricidin-D  Indomethacin Rufen    Alka-Seltzer plus Cold Medicine Cosprin Ketoprofen S-A-C Tablets  Anacin Analgesic Tablets or Capsules Coumadin Korlgesic Salflex  Anacin Extra Strength Analgesic tablets or capsules CP-2 Tablets Lanoril Salicylate  Anaprox Cuprimine Capsules Levenox Salocol  Anexsia-D Dalteparin Magan Salsalate  Anodynos Darvon compound Magnesium Salicylate Sine-off  Ansaid Dasin Capsules Magsal Sodium Salicylate  Anturane Depen Capsules Marnal Soma  APF Arthritis pain formula Dewitt's Pills Measurin Stanback  Argesic Dia-Gesic Meclofenamic Sulfinpyrazone  Arthritis Bayer Timed Release Aspirin Diclofenac Meclomen Sulindac  Arthritis pain formula Anacin Dicumarol Medipren Supac  Analgesic (Safety coated) Arthralgen Diffunasal Mefanamic Suprofen  Arthritis Strength Bufferin Dihydrocodeine Mepro Compound Suprol  Arthropan liquid Dopirydamole Methcarbomol with Aspirin Synalgos  ASA tablets/Enseals Disalcid Micrainin Tagament  Ascriptin Doan's Midol Talwin  Ascriptin A/D Dolene Mobidin Tanderil  Ascriptin Extra Strength Dolobid Moblgesic Ticlid  Ascriptin with Codeine Doloprin or Doloprin with Codeine Momentum Tolectin  Asperbuf Duoprin Mono-gesic Trendar  Aspergum Duradyne Motrin or Motrin IB Triminicin  Aspirin  plain, buffered or enteric coated Durasal Myochrisine Trigesic  Aspirin Suppositories Easprin Nalfon Trillsate  Aspirin with Codeine Ecotrin Regular or Extra Strength Naprosyn Uracel  Atromid-S Efficin Naproxen Ursinus  Auranofin Capsules Elmiron Neocylate Vanquish  Axotal Emagrin Norgesic Verin  Azathioprine Empirin or Empirin with Codeine Normiflo Vitamin E  Azolid Emprazil Nuprin Voltaren  Bayer Aspirin plain, buffered or children's or timed BC Tablets or powders Encaprin Orgaran Warfarin Sodium  Buff-a-Comp Enoxaparin Orudis Zorpin  Buff-a-Comp with Codeine Equegesic Os-Cal-Gesic   Buffaprin Excedrin plain, buffered or Extra Strength Oxalid   Bufferin Arthritis Strength Feldene Oxphenbutazone   Bufferin plain or Extra Strength Feldene Capsules Oxycodone with Aspirin   Bufferin with Codeine Fenoprofen Fenoprofen Pabalate or Pabalate-SF   Buffets II Flogesic Panagesic   Buffinol plain or Extra Strength Florinal or Florinal with Codeine Panwarfarin   Buf-Tabs Flurbiprofen Penicillamine   Butalbital Compound Four-way cold tablets Penicillin   Butazolidin Fragmin Pepto-Bismol   Carbenicillin Geminisyn Percodan   Carna Arthritis Reliever Geopen Persantine   Carprofen Gold's salt Persistin   Chloramphenicol Goody's Phenylbutazone   Chloromycetin Haltrain Piroxlcam   Clmetidine heparin Plaquenil   Cllnoril Hyco-pap Ponstel   Clofibrate Hydroxy chloroquine Propoxyphen         Before stopping any of these medications, be sure to consult the physician who ordered them.  Some, such as Coumadin (Warfarin) are ordered to prevent or treat serious conditions such as "deep thrombosis", "pumonary embolisms", and other heart problems.  The amount of time that you may need off of the medication may also vary with the medication and the reason for which you were taking it.  If you are taking any of these medications, please make sure you notify your pain physician before you undergo any  procedures.         Radiofrequency Lesioning Radiofrequency lesioning is a procedure that is performed to relieve pain. The procedure is often used for back, neck, or arm pain. Radiofrequency lesioning involves the use of a machine that creates radio waves to make heat. During the procedure, the heat is applied to the nerve that carries the pain signal. The heat damages the nerve and interferes with the pain signal. Pain relief usually lasts for 6 months to 1 year. LET [facility] CARE PROVIDER KNOW ABOUT:  Any allergies you have.  All medicines you are taking, including vitamins, herbs, eye drops, creams, and over-the-counter medicines.  Previous problems you or members of your family have had with the use of anesthetics.  Any blood disorders you have.  Previous surgeries you have had.  Any medical conditions you have.  Whether you are pregnant or may be pregnant. RISKS AND COMPLICATIONS Generally, this is a safe procedure. However, problems may occur, including:  Pain or soreness at the injection site.  Infection at the injection site.  Damage to nerves or blood vessels. BEFORE THE PROCEDURE  Ask your health care provider about:  Changing or stopping your regular medicines. This is especially important if you are taking diabetes medicines or blood thinners.  Taking medicines such as aspirin and ibuprofen. These medicines can thin your blood. Do not take these medicines before your procedure if your health care provider instructs you not to.  Follow instructions from your health care provider about eating or drinking restrictions.  Plan to have someone take you home after the procedure.  If you go home right after the procedure, plan to have someone with you for 24 hours. PROCEDURE  You will be given one or more of the following:  A medicine to help you relax (sedative).  A medicine to numb the area (local anesthetic).  You will be awake during the procedure.  You will need to be able to talk with the health care provider during the procedure.  With the help of a type of X-ray (fluoroscopy), the health care provider will insert a radiofrequency needle into the area to be treated.  Next, a wire that carries the radio waves (electrode) will be put through the radiofrequency needle. An electrical pulse will be sent through the electrode to verify the correct nerve. You will feel a tingling sensation, and you may have muscle twitching.  Then, the tissue that is around the needle tip will be heated by an electric current that is passed using the radiofrequency machine. This will numb the nerves.  A bandage (dressing) will be put on the insertion area after the procedure is done. The procedure may vary among health care providers and hospitals. AFTER THE PROCEDURE  Your blood pressure, heart rate, breathing rate, and blood oxygen level will be monitored often until the medicines you were given have worn off.  Return to your normal activities as directed by your health care provider.   This information is not intended to replace advice given to you by your health care provider. Make sure you discuss any questions you have with your health care provider.   Document Released: 05/11/2011 Document Revised: 06/03/2015 Document Reviewed: 10/20/2014 [employer] Interactive Patient Education [employer].

## 2016-06-27 NOTE — Progress Notes (Signed)
Patient's Name: Mark Hodges  MRN: [id number]  Referring Provider: Tracie Harrier, MD  DOB: 1956/06/21  PCP: Tracie Harrier, MD  DOS: 06/27/2016  Note by: Kathlen Brunswick. Dossie Arbour, MD  Service setting: Ambulatory outpatient  Specialty: Interventional Pain Management  Location: [facility] (AMB) Pain Management Facility    Patient type: Established   Primary Reason(s) for Visit: Encounter for prescription drug management (Level of risk: moderate) CC: Hip Pain (right); Leg Pain (right); Back Pain (lower); and Knee Pain (left)  HPI  Mark Hodges is a 60 y.o. year old, male patient, who comes today for an initial evaluation. He has Difficulty in walking; Compulsive tobacco user syndrome; Hypercholesterolemia without hypertriglyceridemia; Long term current use of opiate analgesic; Long term prescription opiate use; Opiate use (15 MME/Day); Opiate dependence ([place]); Encounter for therapeutic drug level monitoring; Chronic pain; Chronic low back pain (Location of Primary Source of Pain) (Bilateral) (R>L); Chronic lumbar radicular pain (Polyradiculopathy) (Right); Ataxia; HLD (hyperlipidemia); Current tobacco use; Pure hypercholesterolemia; Arm numbness; Substance use disorder Risk: LOW; Chronic upper back pain; Cervical spondylosis; Chronic neck pain; Cervicogenic headache; Chronic radicular cervical pain; Chronic hip pain (Location of Secondary source of pain) (Right); Generalized anxiety disorder; Depression; At high risk for falls; Erectile dysfunction; History of TIA (transient ischemic attack); 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 without associated mass lesion or cord expansion.; Abnormal MRI, lumbar spine (06/26/2015); Lumbar spondylosis; 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 His primarily concern today is the Hip Pain (right); Leg Pain (right); Back Pain (lower); and Knee Pain (left)  Pain Assessment: Self-Reported Pain Score: 5  (pain information scale given)/10 Clinically the patient looks like a 2/10 Reported level is inconsistent with clinical observations. Information on the proper use of the pain score provided to the patient today. Pain Type: Chronic pain Pain Location: Hip (leg, back, knee) Pain Orientation: Right Pain Descriptors / Indicators: Sharp, Shooting Pain Frequency: Constant  The patient comes into the clinics today for pharmacological management of his chronic pain. I last saw this patient on 04/14/2016. The patient  reports that he does not use drugs. His body mass index is 24.13 kg/m.  Date of Last Visit: 04/14/16 Service Provided on Last Visit: Med Refill  Controlled Substance Pharmacotherapy Assessment & REMS (Risk Evaluation and Mitigation Strategy)  Analgesic: Oxycodone/APAP 10/325 one daily MME/day: 15 mg/day.  Pill Count: Bottle labeled oxycodone/acetaminophen 10/325 mg #11/30  Filled 06-09-16  Pharmacokinetics: Onset of action (Liberation/Absorption): Within expected pharmacological parameters Time to Peak effect (Distribution): Timing and results are as within normal expected parameters Duration of action (Metabolism/Excretion): Within normal limits for medication Pharmacodynamics: Analgesic Effect: More than 50% Activity Facilitation: Medication(s) allow patient to sit, stand, walk, and do the basic ADLs Perceived Effectiveness: Described as relatively effective, allowing for increase in activities of daily living (ADL) Side-effects or Adverse  reactions: None reported Monitoring: [place] PMP: Online review of the past 6-month period conducted. Compliant with practice rules and regulations List of all UDS test(s) done:  Lab Results  Component Value Date   TOXASSSELUR FINAL 02/08/2016   [place] FINAL 10/28/2015   Last UDS on record: ToxAssure  Select 13  Date Value Ref Range Status  02/08/2016 FINAL  Final    Comment:    ==================================================================== TOXASSURE SELECT 13 (MW) ==================================================================== Test                             Result       Flag       Units Drug Present and Declared for Prescription Verification   Oxycodone                      163          EXPECTED   ng/mg creat   Oxymorphone                    1192         EXPECTED   ng/mg creat   Noroxycodone                   573          EXPECTED   ng/mg creat   Noroxymorphone                 487          EXPECTED   ng/mg creat    Sources of oxycodone are scheduled prescription medications.    Oxymorphone, noroxycodone, and noroxymorphone are expected    metabolites of oxycodone. Oxymorphone is also available as a    scheduled prescription medication. ==================================================================== Test                      Result    Flag   Units      Ref Range   Creatinine              78               mg/dL      >=20 ==================================================================== Declared Medications:  The flagging and interpretation on this report are based on the  following declared medications.  Unexpected results may arise from  inaccuracies in the declared medications.  **Note: The testing scope of this panel includes these medications:  Oxycodone (Oxycodone Acetaminophen)  **Note: The testing scope of this panel does not include following  reported medications:  Acetaminophen (Oxycodone Acetaminophen)  Alprostadil  Aspirin  Atorvastatin   Gabapentin  Nortriptyline  Tamsulosin  Venlafaxine ==================================================================== For clinical consultation, please call [phone]. ====================================================================    UDS interpretation: Compliant          Medication Assessment Form: Reviewed. Patient indicates being compliant with therapy Treatment compliance: Compliant Risk Assessment: Aberrant Behavior: None observed today Substance Use Disorder (SUD) Risk Level: Low-to-moderate Risk of opioid abuse or dependence: 0.7-3.0% with doses ? 36 MME/day and 6.1-26% with doses ? 120 MME/day. Opioid Risk Tool (ORT) Score: 4   Moderate Risk for SUD (Score between 4-7) Depression Scale Score: PHQ-2: 0   No depression (0) PHQ-9: 0   No depression (0-4)  Pharmacologic Plan: No change in therapy, at this time  Laboratory Chemistry  Inflammation Markers Lab Results  Component Value Date   ESRSEDRATE 5 11/19/2015   CRP 1.4 (H) 11/19/2015   Renal Function Lab Results  Component Value Date   BUN 5 (L) 11/19/2015   CREATININE 0.73 11/19/2015   GFRAA >60 11/19/2015   GFRNONAA >60 11/19/2015   Hepatic Function Lab Results  Component Value Date   AST 25 11/19/2015   ALT 24  11/19/2015   ALBUMIN 4.3 11/19/2015   Electrolytes Lab Results  Component Value Date   NA 140 11/19/2015   K 3.9 11/19/2015   CL 104 11/19/2015   CALCIUM 9.7 11/19/2015   MG 2.0 11/19/2015   Pain Modulating Vitamins No results found for: [place], VD125OH2TOT, [id number], UK:[id number], 25OHVITD1, 25OHVITD2, 25OHVITD3, VITAMINB12 Coagulation Parameters No results found for: INR, LABPROT, APTT, PLT Cardiovascular No results found for: BNP, HGB, HCT  Note: Lab results reviewed.  Recent Diagnostic Imaging  Dg Chest 2 View  Result Date: 06/01/2016 CLINICAL DATA:  Shortness of breath. History of COPD. Former smoker. EXAM: CHEST  2 VIEW COMPARISON:  PA and lateral chest x-ray of Jan 26, 2006 FINDINGS: The lungs remain hyperinflated with hemidiaphragm flattening. There is no focal infiltrate. There is no pleural effusion. The heart and pulmonary vascularity are normal. The mediastinum is normal in width. The bony thorax exhibits no acute abnormality. IMPRESSION: COPD.  There is no active cardiopulmonary disease. Electronically Signed   By: David  Martinique M.D.   On: 06/01/2016 13:44   Meds  The patient has a current medication list which includes the following prescription(s): alprostadil, aspirin, atorvastatin, diazepam, gabapentin, lamotrigine, nortriptyline, nortriptyline, oxycodone-acetaminophen, oxycodone-acetaminophen, oxycodone-acetaminophen, ropinirole, tamsulosin, and venlafaxine hcl.  Current Outpatient Prescriptions on File Prior to Visit  Medication Sig  . alprostadil (CAVERJECT IMPULSE) 10 MCG injection 10 mcg by Intracavitary route as needed for erectile dysfunction. use no more than 3 times per week  . aspirin 325 MG tablet Take by mouth.  . diazepam (VALIUM) 10 MG tablet Take 5 mg by mouth at bedtime as needed for anxiety.   . lamoTRIgine (LAMICTAL) 25 MG tablet Take 25 mg by mouth daily. Take 1 tablet at night for 1 week then increase by 1 tab per week up 6 tablets BID-  Will increase up to 6 pills bis  . nortriptyline (PAMELOR) 10 MG capsule Take 10 mg by mouth 3 (three) times daily.   . nortriptyline (PAMELOR) 50 MG capsule Take 50 mg by mouth 3 (three) times daily.  . tamsulosin (FLOMAX) 0.4 MG CAPS capsule Take 1 capsule (0.4 mg total) by mouth daily.  . Venlafaxine HCl 225 MG TB24 Take 2 tablets by mouth daily.    No current facility-administered medications on file prior to visit.    ROS  Constitutional: Denies any fever or chills Gastrointestinal: No reported hemesis, hematochezia, vomiting, or acute GI distress Musculoskeletal: Denies any acute onset joint swelling, redness, loss of ROM, or weakness Neurological: No reported episodes of acute onset  apraxia, aphasia, dysarthria, agnosia, amnesia, paralysis, loss of coordination, or loss of consciousness  Allergies  Mr. Duel has No Known Allergies.  Kenneth  Medical:  Mr. Stallings  has a past medical history of Arthritis; BPH (benign prostatic hyperplasia); Clinical depression (10/23/2014); Depression; Erectile dysfunction; Hyperlipidemia; Hypogonadism in male; Lower urinary tract infection; Premature ejaculation; Prostate cancer ([place]); Stroke [facility]); and TIA (transient ischemic attack). Family: family history includes Heart disease in his father. Surgical:  has a past surgical history that includes Tonsillectomy; Colonoscopy with propofol (N/A, 01/26/2016); and Esophagogastroduodenoscopy (egd) with propofol (N/A, 01/26/2016). Tobacco:  reports that he has quit smoking. His smokeless tobacco use includes Chew. Alcohol:  reports that he does not drink alcohol. Drug:  reports that he does not use drugs.  Constitutional Exam  General appearance: Well nourished, well developed, and well hydrated. In no acute distress Vitals:   06/27/16 0935 06/27/16 0938  BP:  (!) 109/91  Pulse: 98  Resp: 20   Temp: 98.2 F (36.8 C)   SpO2: 100%   Weight: 145 lb (65.8 kg)   Height: 5\' 5"  (1.651 m)   BMI Assessment: Estimated body mass index is 24.13 kg/m as calculated from the following:   Height as of this encounter: 5\' 5"  (1.651 m).   Weight as of this encounter: 145 lb (65.8 kg).   BMI interpretation: (18.5-24.9 kg/m2) = Ideal body weight BMI Readings from Last 4 Encounters:  06/27/16 24.13 kg/m  04/21/16 20.80 kg/m  04/14/16 22.81 kg/m  03/08/16 22.81 kg/m   Wt Readings from Last 4 Encounters:  06/27/16 145 lb (65.8 kg)  04/21/16 136 lb 12.8 oz (62.1 kg)  04/14/16 150 lb (68 kg)  03/08/16 150 lb (68 kg)  Psych/Mental status: Alert and oriented x 3 (person, place, & time) Eyes: PERLA Respiratory: No evidence of acute respiratory distress  Cervical Spine Exam  Inspection: No masses,  redness, or swelling Alignment: Symmetrical Functional ROM: Unrestricted ROM Stability: No instability detected Muscle strength & Tone: Functionally intact Sensory: Unimpaired Palpation: Non-contributory  Upper Extremity (UE) Exam    Side: Right upper extremity  Side: Left upper extremity  Inspection: No masses, redness, swelling, or asymmetry  Inspection: No masses, redness, swelling, or asymmetry  Functional ROM: Unrestricted ROM         Functional ROM: Unrestricted ROM          Muscle strength & Tone: Functionally intact  Muscle strength & Tone: Functionally intact  Sensory: Unimpaired  Sensory: Unimpaired  Palpation: Non-contributory  Palpation: Non-contributory   Thoracic Spine Exam  Inspection: No masses, redness, or swelling Alignment: Symmetrical Functional ROM: Unrestricted ROM Stability: No instability detected Sensory: Unimpaired Muscle strength & Tone: Functionally intact Palpation: Non-contributory  Lumbar Spine Exam  Inspection: No masses, redness, or swelling Alignment: Symmetrical Functional ROM: Unrestricted ROM Stability: No instability detected Muscle strength & Tone: Functionally intact Sensory: Unimpaired Palpation: Non-contributory Provocative Tests: Lumbar Hyperextension and rotation test: evaluation deferred today       Patrick's Maneuver: evaluation deferred today              Gait & Posture Assessment  Ambulation: Unassisted Gait: Relatively normal for age and body habitus Posture: WNL   Lower Extremity Exam    Side: Right lower extremity  Side: Left lower extremity  Inspection: No masses, redness, swelling, or asymmetry  Inspection: No masses, redness, swelling, or asymmetry  Functional ROM: Unrestricted ROM          Functional ROM: Unrestricted ROM          Muscle strength & Tone: Functionally intact  Muscle strength & Tone: Functionally intact  Sensory: Unimpaired  Sensory: Unimpaired  Palpation: Non-contributory  Palpation:  Non-contributory   Assessment  Primary Diagnosis & Pertinent Problem List: The primary encounter diagnosis was Other chronic pain. Diagnoses of Chronic low back pain without sciatica, unspecified back pain laterality, Lumbar facet syndrome (Location of Primary Source of Pain) (Bilateral) (R>L), Long term current use of opiate analgesic, Opiate use (15 MME/Day), and Neuropathic pain were also pertinent to this visit.  Visit Diagnosis: 1. Other chronic pain   2. Chronic low back pain without sciatica, unspecified back pain laterality   3. Lumbar facet syndrome (Location of Primary Source of Pain) (Bilateral) (R>L)   4. Long term current use of opiate analgesic   5. Opiate use (15 MME/Day)   6. Neuropathic pain    Plan of Care  Pharmacotherapy (Medications Ordered): Meds ordered this encounter  Medications  . gabapentin (NEURONTIN) 300 MG capsule    Sig: Take 3 capsules (900 mg total) by mouth 3 (three) times daily. Follow titration schedule.    Dispense:  810 capsule    Refill:  0    This is a 90 day refill to be mailed out.  Marland Kitchen oxyCODONE-acetaminophen (PERCOCET) 10-325 MG tablet    Sig: Take 1 tablet by mouth daily as needed for pain.    Dispense:  30 tablet    Refill:  0    Do not place this medication, or any other prescription from our practice, on "Automatic Refill". Patient may have prescription filled one day early if pharmacy is closed on scheduled refill date. Do not fill until: 07/18/16 To last until: 08/17/16  . oxyCODONE-acetaminophen (PERCOCET) 10-325 MG tablet    Sig: Take 1 tablet by mouth daily as needed for pain.    Dispense:  30 tablet    Refill:  0    Do not place this medication, or any other prescription from our practice, on "Automatic Refill". Patient may have prescription filled one day early if pharmacy is closed on scheduled refill date. Do not fill until: 08/17/16 To last until: 09/16/16  . oxyCODONE-acetaminophen (PERCOCET) 10-325 MG tablet    Sig: Take  1 tablet by mouth daily as needed for pain.    Dispense:  30 tablet    Refill:  0    Do not place this medication, or any other prescription from our practice, on "Automatic Refill". Patient may have prescription filled one day early if pharmacy is closed on scheduled refill date. Do not fill until: 09/16/16 To last until: 10/17/15   New Prescriptions   No medications on file   Medications administered during this visit: Mr. Putney had no medications administered during this visit. Lab-work, Procedure(s), & Referral(s) Ordered: Orders Placed This Encounter  Procedures  . Radiofrequency,Lumbar   Imaging & Referral(s) Ordered: None  IInterventional Therapies: Scheduled:  Bilateral lumbar facet radiofrequency ablation under fluoroscopic guidance and IV sedation. We will do one side first and will follow-up 6 weeks later with the opposite side.    Considering:  N/A   PRN Procedures:  N/A   Requested PM Follow-up: Return in 3 months (on 09/29/2016) for Med-Mgmt, In addition, Schedule Procedure, (ASAP).  Future Appointments Date Time Provider [place]  06/29/2016 11:00 AM Alanson Puls, PT [facility] None  07/04/2016 11:00 AM Alanson Puls, PT [facility] None  07/07/2016 1:00 PM Alanson Puls, PT [facility] None  09/29/2016 10:00 AM Milinda Pointer, MD [facility] None  10/14/2016 9:30 AM [facility] None  10/21/2016 8:45 AM Hollice Espy, MD [facility] None   Primary Care Physician: Tracie Harrier, MD Location: [facility] Outpatient Pain Management Facility Note by: Kathlen Brunswick. Dossie Arbour, M.D, DABA, DABAPM, DABPM, DABIPP, FIPP  Pain Score Disclaimer: We use the NRS-11 scale. This is a self-reported, subjective measurement of pain severity with only modest accuracy. It is used primarily to identify changes within a particular patient. It must be understood that outpatient pain scales are significantly less accurate that those used for research, where they can  be applied under ideal controlled circumstances with minimal exposure to variables. In reality, the score is likely to be a combination of pain intensity and pain affect, where pain affect describes the degree of emotional arousal or changes in action readiness caused by the sensory experience of pain. Factors such as social and work situation, setting, emotional state, anxiety levels, expectation, and prior pain experience  may influence pain perception and show large inter-individual differences that may also be affected by time variables.  Patient instructions provided during this appointment: Patient Instructions   GENERAL RISKS AND COMPLICATIONS  What are the risk, side effects and possible complications? Generally speaking, most procedures are safe.  However, with any procedure there are risks, side effects, and the possibility of complications.  The risks and complications are dependent upon the sites that are lesioned, or the type of nerve block to be performed.  The closer the procedure is to the spine, the more serious the risks are.  Great care is taken when placing the radio frequency needles, block needles or lesioning probes, but sometimes complications can occur. 1. Infection: Any time there is an injection through the skin, there is a risk of infection.  This is why sterile conditions are used for these blocks.  There are four possible types of infection. 1. Localized skin infection. 2. Central Nervous System Infection-This can be in the form of Meningitis, which can be deadly. 3. Epidural Infections-This can be in the form of an epidural abscess, which can cause pressure inside of the spine, causing compression of the spinal cord with subsequent paralysis. This would require an emergency surgery to decompress, and there are no guarantees that the patient would recover from the paralysis. 4. Discitis-This is an infection of the intervertebral discs.  It occurs in about 1% of discography  procedures.  It is difficult to treat and it may lead to surgery.        2. Pain: the needles have to go through skin and soft tissues, will cause soreness.       3. Damage to internal structures:  The nerves to be lesioned may be near blood vessels or    other nerves which can be potentially damaged.       4. Bleeding: Bleeding is more common if the patient is taking blood thinners such as  aspirin, Coumadin, Ticiid, Plavix, etc., or if he/she have some genetic predisposition  such as hemophilia. Bleeding into the spinal canal can cause compression of the spinal  cord with subsequent paralysis.  This would require an emergency surgery to  decompress and there are no guarantees that the patient would recover from the  paralysis.       5. Pneumothorax:  Puncturing of a lung is a possibility, every time a needle is introduced in  the area of the chest or upper back.  Pneumothorax refers to free air around the  collapsed lung(s), inside of the thoracic cavity (chest cavity).  Another two possible  complications related to a similar event would include: Hemothorax and Chylothorax.   These are variations of the Pneumothorax, where instead of air around the collapsed  lung(s), you may have blood or chyle, respectively.       6. Spinal headaches: They may occur with any procedures in the area of the spine.       7. Persistent CSF (Cerebro-Spinal Fluid) leakage: This is a rare problem, but may occur  with prolonged intrathecal or epidural catheters either due to the formation of a fistulous  track or a dural tear.       8. Nerve damage: By working so close to the spinal cord, there is always a possibility of  nerve damage, which could be as serious as a permanent spinal cord injury with  paralysis.       9. Death:  Although rare, severe deadly allergic reactions known as "  Anaphylactic  reaction" can occur to any of the medications used.      10. Worsening of the symptoms:  We can always make thing worse.  What  are the chances of something like this happening? Chances of any of this occuring are extremely low.  By statistics, you have more of a chance of getting killed in a motor vehicle accident: while driving to the hospital than any of the above occurring .  Nevertheless, you should be aware that they are possibilities.  In general, it is similar to taking a shower.  Everybody knows that you can slip, hit your head and get killed.  Does that mean that you should not shower again?  Nevertheless always keep in mind that statistics do not mean anything if you happen to be on the wrong side of them.  Even if a procedure has a 1 (one) in a 1,000,000 (million) chance of going wrong, it you happen to be that one..Also, keep in mind that by statistics, you have more of a chance of having something go wrong when taking medications.  Who should not have this procedure? If you are on a blood thinning medication (e.g. Coumadin, Plavix, see list of "Blood Thinners"), or if you have an active infection going on, you should not have the procedure.  If you are taking any blood thinners, please inform your physician.  How should I prepare for this procedure?  Do not eat or drink anything at least six hours prior to the procedure.  Bring a driver with you .  It cannot be a taxi.  Come accompanied by an adult that can drive you back, and that is strong enough to help you if your legs get weak or numb from the local anesthetic.  Take all of your medicines the morning of the procedure with just enough water to swallow them.  If you have diabetes, make sure that you are scheduled to have your procedure done first thing in the morning, whenever possible.  If you have diabetes, take only half of your insulin dose and notify our nurse that you have done so as soon as you arrive at the clinic.  If you are diabetic, but only take blood sugar pills (oral hypoglycemic), then do not take them on the morning of your procedure.   You may take them after you have had the procedure.  Do not take aspirin or any aspirin-containing medications, at least eleven (11) days prior to the procedure.  They may prolong bleeding.  Wear loose fitting clothing that may be easy to take off and that you would not mind if it got stained with Betadine or blood.  Do not wear any jewelry or perfume  Remove any nail coloring.  It will interfere with some of our monitoring equipment.  NOTE: Remember that this is not meant to be interpreted as a complete list of all possible complications.  Unforeseen problems may occur.  BLOOD THINNERS The following drugs contain aspirin or other products, which can cause increased bleeding during surgery and should not be taken for 2 weeks prior to and 1 week after surgery.  If you should need take something for relief of minor pain, you may take acetaminophen which is found in Tylenol,m Datril, Anacin-3 and Panadol. It is not blood thinner. The products listed below are.  Do not take any of the products listed below in addition to any listed on your instruction sheet.  A.P.C or A.P.C with Codeine Codeine Phosphate  Capsules #3 Ibuprofen Ridaura  ABC compound Congesprin Imuran rimadil  Advil Cope Indocin Robaxisal  Alka-Seltzer Effervescent Pain Reliever and Antacid Coricidin or Coricidin-D  Indomethacin Rufen  Alka-Seltzer plus Cold Medicine Cosprin Ketoprofen S-A-C Tablets  Anacin Analgesic Tablets or Capsules Coumadin Korlgesic Salflex  Anacin Extra Strength Analgesic tablets or capsules CP-2 Tablets Lanoril Salicylate  Anaprox Cuprimine Capsules Levenox Salocol  Anexsia-D Dalteparin Magan Salsalate  Anodynos Darvon compound Magnesium Salicylate Sine-off  Ansaid Dasin Capsules Magsal Sodium Salicylate  Anturane Depen Capsules Marnal Soma  APF Arthritis pain formula Dewitt's Pills Measurin Stanback  Argesic Dia-Gesic Meclofenamic Sulfinpyrazone  Arthritis Bayer Timed Release Aspirin Diclofenac  Meclomen Sulindac  Arthritis pain formula Anacin Dicumarol Medipren Supac  Analgesic (Safety coated) Arthralgen Diffunasal Mefanamic Suprofen  Arthritis Strength Bufferin Dihydrocodeine Mepro Compound Suprol  Arthropan liquid Dopirydamole Methcarbomol with Aspirin Synalgos  ASA tablets/Enseals Disalcid Micrainin Tagament  Ascriptin Doan's Midol Talwin  Ascriptin A/D Dolene Mobidin Tanderil  Ascriptin Extra Strength Dolobid Moblgesic Ticlid  Ascriptin with Codeine Doloprin or Doloprin with Codeine Momentum Tolectin  Asperbuf Duoprin Mono-gesic Trendar  Aspergum Duradyne Motrin or Motrin IB Triminicin  Aspirin plain, buffered or enteric coated Durasal Myochrisine Trigesic  Aspirin Suppositories Easprin Nalfon Trillsate  Aspirin with Codeine Ecotrin Regular or Extra Strength Naprosyn Uracel  Atromid-S Efficin Naproxen Ursinus  Auranofin Capsules Elmiron Neocylate Vanquish  Axotal Emagrin Norgesic Verin  Azathioprine Empirin or Empirin with Codeine Normiflo Vitamin E  Azolid Emprazil Nuprin Voltaren  Bayer Aspirin plain, buffered or children's or timed BC Tablets or powders Encaprin Orgaran Warfarin Sodium  Buff-a-Comp Enoxaparin Orudis Zorpin  Buff-a-Comp with Codeine Equegesic Os-Cal-Gesic   Buffaprin Excedrin plain, buffered or Extra Strength Oxalid   Bufferin Arthritis Strength Feldene Oxphenbutazone   Bufferin plain or Extra Strength Feldene Capsules Oxycodone with Aspirin   Bufferin with Codeine Fenoprofen Fenoprofen Pabalate or Pabalate-SF   Buffets II Flogesic Panagesic   Buffinol plain or Extra Strength Florinal or Florinal with Codeine Panwarfarin   Buf-Tabs Flurbiprofen Penicillamine   Butalbital Compound Four-way cold tablets Penicillin   Butazolidin Fragmin Pepto-Bismol   Carbenicillin Geminisyn Percodan   Carna Arthritis Reliever Geopen Persantine   Carprofen Gold's salt Persistin   Chloramphenicol Goody's Phenylbutazone   Chloromycetin Haltrain Piroxlcam   Clmetidine  heparin Plaquenil   Cllnoril Hyco-pap Ponstel   Clofibrate Hydroxy chloroquine Propoxyphen         Before stopping any of these medications, be sure to consult the physician who ordered them.  Some, such as Coumadin (Warfarin) are ordered to prevent or treat serious conditions such as "deep thrombosis", "pumonary embolisms", and other heart problems.  The amount of time that you may need off of the medication may also vary with the medication and the reason for which you were taking it.  If you are taking any of these medications, please make sure you notify your pain physician before you undergo any procedures.         Radiofrequency Lesioning Radiofrequency lesioning is a procedure that is performed to relieve pain. The procedure is often used for back, neck, or arm pain. Radiofrequency lesioning involves the use of a machine that creates radio waves to make heat. During the procedure, the heat is applied to the nerve that carries the pain signal. The heat damages the nerve and interferes with the pain signal. Pain relief usually lasts for 6 months to 1 year. LET [facility] CARE PROVIDER KNOW ABOUT:  Any allergies you have.  All medicines you are taking, including vitamins,  herbs, eye drops, creams, and over-the-counter medicines.  Previous problems you or members of your family have had with the use of anesthetics.  Any blood disorders you have.  Previous surgeries you have had.  Any medical conditions you have.  Whether you are pregnant or may be pregnant. RISKS AND COMPLICATIONS Generally, this is a safe procedure. However, problems may occur, including:  Pain or soreness at the injection site.  Infection at the injection site.  Damage to nerves or blood vessels. BEFORE THE PROCEDURE  Ask your health care provider about:  Changing or stopping your regular medicines. This is especially important if you are taking diabetes medicines or blood thinners.  Taking  medicines such as aspirin and ibuprofen. These medicines can thin your blood. Do not take these medicines before your procedure if your health care provider instructs you not to.  Follow instructions from your health care provider about eating or drinking restrictions.  Plan to have someone take you home after the procedure.  If you go home right after the procedure, plan to have someone with you for 24 hours. PROCEDURE  You will be given one or more of the following:  A medicine to help you relax (sedative).  A medicine to numb the area (local anesthetic).  You will be awake during the procedure. You will need to be able to talk with the health care provider during the procedure.  With the help of a type of X-ray (fluoroscopy), the health care provider will insert a radiofrequency needle into the area to be treated.  Next, a wire that carries the radio waves (electrode) will be put through the radiofrequency needle. An electrical pulse will be sent through the electrode to verify the correct nerve. You will feel a tingling sensation, and you may have muscle twitching.  Then, the tissue that is around the needle tip will be heated by an electric current that is passed using the radiofrequency machine. This will numb the nerves.  A bandage (dressing) will be put on the insertion area after the procedure is done. The procedure may vary among health care providers and hospitals. AFTER THE PROCEDURE  Your blood pressure, heart rate, breathing rate, and blood oxygen level will be monitored often until the medicines you were given have worn off.  Return to your normal activities as directed by your health care provider.   This information is not intended to replace advice given to you by your health care provider. Make sure you discuss any questions you have with your health care provider.   Document Released: 05/11/2011 Document Revised: 06/03/2015 Document Reviewed: 10/20/2014 Elsevier  Interactive Patient Education [employer].

## 2016-06-27 NOTE — Progress Notes (Signed)
Safety precautions to be maintained throughout the outpatient stay will include: orient to surroundings, keep bed in low position, maintain call bell within reach at all times, provide assistance with transfer out of bed and ambulation.  Bottle labeled oxycodone/acetaminophen 10/325 mg #11/30  Filled 06-09-16

## 2016-06-29 ENCOUNTER — Ambulatory Visit: Payer: BLUE CROSS/BLUE SHIELD | Attending: Neurology | Admitting: Physical Therapy

## 2016-06-29 ENCOUNTER — Encounter: Payer: Self-pay | Admitting: Physical Therapy

## 2016-06-29 DIAGNOSIS — G478 Other sleep disorders: Secondary | ICD-10-CM | POA: Insufficient documentation

## 2016-06-29 NOTE — Therapy (Addendum)
[place] MAIN [facility] SERVICES [address] [place], [place], [address] Phone: [phone]   Fax:  [phone]  Physical Therapy Treatment  Patient Details  Name: Mark Hodges MRN: [id number] Date of Birth: September 19, 1956 Referring Provider: Anabel Bene  Encounter Date: 06/29/2016      PT End of Session - 06/29/16 1133    Visit Number 5   Number of Visits 17   Date for PT Re-Evaluation 07/19/16   Authorization Type blue cross blue shield   PT Start Time 1100   PT Stop Time 1140   PT Time Calculation (min) 40 min   Equipment Utilized During Treatment Gait belt   Activity Tolerance Patient tolerated treatment well;No increased pain   Behavior During Therapy WFL for tasks assessed/performed      Past Medical History:  Diagnosis Date  . Arthritis   . BPH (benign prostatic hyperplasia)   . Clinical depression 10/23/2014  . Depression   . Erectile dysfunction   . Hyperlipidemia   . Hypogonadism in male   . Lower urinary tract infection   . Premature ejaculation   . Prostate cancer ([place])   . Stroke ([place])   . TIA (transient ischemic attack)     Past Surgical History:  Procedure Laterality Date  . COLONOSCOPY WITH PROPOFOL N/A 01/26/2016   Procedure: COLONOSCOPY WITH PROPOFOL;  Surgeon: Lollie Sails, MD;  Location: [facility] ENDOSCOPY;  Service: Endoscopy;  Laterality: N/A;  . ESOPHAGOGASTRODUODENOSCOPY (EGD) WITH PROPOFOL N/A 01/26/2016   Procedure: ESOPHAGOGASTRODUODENOSCOPY (EGD) WITH PROPOFOL;  Surgeon: Lollie Sails, MD;  Location: [facility] ENDOSCOPY;  Service: Endoscopy;  Laterality: N/A;  . TONSILLECTOMY      There were no vitals filed for this visit.      Subjective Assessment - 06/29/16 1130    Subjective Patient reports that he fell 2 x this week.  He rates his R hip pain a 6/10. Patient reports walking around in his yard in the grass and around his pond.    Patient is accompained by: Family member   Pertinent History  Cervical spondylosisLumbar spondylosis see History above   Patient Stated Goals to walk better   Pain Onset More than a month ago      Therapeutic exercise; TM walking 1. 5 miles / hour x 10 minutes with 1 minimal rest period Octane cross trainner x 10 minutes level 10 with 1 short rest period Toe tapping from purple balance foam pad x 20 x 2 without loss of balance Step ups from purple foam pad x 20 without UE support and no loss of balance Leg press 120 lbs x 20 x 3 sets, heel raises x 20 x 3 sets 100 lbs Reviewed HEP with theraband for standing hip abduction. He is performing seated theraband exercises.  Patient is able to perform all exercises without loss of balance and needs short rest periods throughout session. Min  cues used throughout with no postural sway or LOB most seen with narrow base of support and while changing  surfaces.. Patient performs intermediate level exercises without pain behaviors.                          PT Education - 06/29/16 1133    Education provided Yes   Education Details safety with using AD in the house   Person(s) Educated Patient   Methods Explanation   Comprehension Verbalized understanding  PT Long Term Goals - 05/24/16 1217      PT LONG TERM GOAL #1   Title Patient will be independent with HEP and improve his functional ambulation .   Time 8   Period Weeks   Status New     PT LONG TERM GOAL #2   Title Patient will improve his 5 x  sit to stand to indicate decreased falls risk.   Time 8   Period Weeks   Status New     PT LONG TERM GOAL #3   Title Patient will improve his 9 MW test to indicate decreased falls risk.    Time 8   Period Weeks   Status New     PT LONG TERM GOAL #4   Title Patient will improve 6 MW test to 1000 feet in order to be a community ambulator   Time 8   Period Weeks   Status New     PT LONG TERM GOAL #5   Title Patient will increase BLE strength to 4+/5 to improve  overall mobility and decrease his falls risk.   Time 8   Period Weeks   Status New               Plan - 06/29/16 1134    Clinical Impression Statement Patient is able to get on and off equipment independently wihtout loss of balance. He is able to perform step tapping and step ups from foam to step without UE assist. Patient is able to perform TM walking at 1.5 m/sec, and strengthening exercises with no reports of increased pain and minimal rest periods.    Rehab Potential Fair   PT Frequency 2x / week   PT Duration 4 weeks   PT Treatment/Interventions Manual techniques;Patient/family education;Neuromuscular re-education;Balance training;Therapeutic exercise;Therapeutic activities;Moist Heat;Gait training   PT Next Visit Plan balance and strengthening of LE   PT Home Exercise Plan LE exercises   Consulted and Agree with Plan of Care Patient      Patient will benefit from skilled therapeutic intervention in order to improve the following deficits and impairments:  Decreased cognition, Abnormal gait, Difficulty walking, Cardiopulmonary status limiting activity, Decreased endurance, Decreased activity tolerance, Pain, Decreased balance, Impaired flexibility, Decreased mobility, Decreased strength, Impaired sensation  Visit Diagnosis: Difficulty waking     Problem List Patient Active Problem List   Diagnosis Date Noted  . Osteoarthritis of hips (Bilateral) 02/08/2016  . Lumbar facet syndrome (Location of Primary Source of Pain) (Bilateral) (R>L) 02/08/2016  . Abnormal MRI, thoracic spine (07/23/2015) 11/19/2015  .  osteoarthritis of hip (Right) 11/03/2015  . Chronic lower extremity pain (Right) 10/29/2015  . History of prostate cancer 10/29/2015  . Lumbar facet arthropathy 10/29/2015  . 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) 10/29/2015  . Neuropathic pain 10/28/2015  . 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 Substance use disorder Risk:  LOW 07/29/2015  . Chronic upper back pain 07/29/2015  . Cervical spondylosis 07/29/2015  . Chronic neck pain 07/29/2015  . Cervicogenic headache 07/29/2015  . Chronic radicular cervical pain 07/29/2015  . Chronic hip pain (Location of Secondary source of pain) (Right) 07/29/2015  . Generalized anxiety disorder 07/29/2015  . Depression 07/29/2015  . At high risk for falls 07/29/2015  . Erectile dysfunction 07/29/2015  . History of TIA (transient ischemic attack) 07/29/2015  . Syrinx of spinal cord ([place]) from T7-8 through T9-10 without associated mass lesion or cord expansion. 07/29/2015  . Abnormal MRI, lumbar spine (06/26/2015)  07/29/2015  . Lumbar spondylosis 07/29/2015  . Long term current use of opiate analgesic 07/28/2015  . Long term prescription opiate use 07/28/2015  . Opiate use (15 MME/Day) 07/28/2015  . Opiate dependence ([place]) 07/28/2015  . Encounter for therapeutic drug level monitoring 07/28/2015  . Chronic pain 07/28/2015  . Chronic low back pain (Location of Primary Source of Pain) (Bilateral) (R>L) 07/28/2015  . Chronic lumbar radicular pain (Polyradiculopathy) (Right) 07/28/2015  . Ataxia 07/28/2015  . Arm numbness 05/29/2015  . HLD (hyperlipidemia) 04/23/2015  . Difficulty in walking 12/16/2014  . Difficulty in walking, not elsewhere classified 12/16/2014  . Compulsive tobacco user syndrome 10/23/2014  . Hypercholesterolemia without hypertriglyceridemia 10/23/2014  . Current tobacco use 10/23/2014  . Pure hypercholesterolemia 10/23/2014  . Clinical depression 10/23/2014  . Mild chronic obstructive pulmonary disease ([place]) 06/18/2014   Alanson Puls, PT, DPT [place] 06/29/2016, 11:44 AM  [place] MAIN [facility] SERVICES [address] [place], [place], [address] Phone: [phone]   Fax:  [phone]  Name: Mark Hodges MRN: [id number] Date of Birth: 02/22/1956

## 2016-07-04 ENCOUNTER — Encounter: Payer: Self-pay | Admitting: Physical Therapy

## 2016-07-04 ENCOUNTER — Ambulatory Visit: Payer: BLUE CROSS/BLUE SHIELD | Admitting: Physical Therapy

## 2016-07-04 VITALS — BP 137/97

## 2016-07-04 DIAGNOSIS — G478 Other sleep disorders: Secondary | ICD-10-CM | POA: Diagnosis not present

## 2016-07-04 NOTE — Therapy (Signed)
[place] MAIN [facility] SERVICES [address] [place], [place], [address] Phone: [phone]   Fax:  [phone]  Physical Therapy Treatment  Patient Details  Name: Mark Hodges MRN: [id number] Date of Birth: August 11, 1956 Referring Provider: Anabel Bene  Encounter Date: 07/04/2016      PT End of Session - 07/04/16 1132    Visit Number 6   Number of Visits 17   Date for PT Re-Evaluation 07/19/16   Authorization Type blue cross blue shield   PT Start Time 1050   PT Stop Time 1115   PT Time Calculation (min) 25 min   Equipment Utilized During Treatment Gait belt   Activity Tolerance Patient tolerated treatment well   Behavior During Therapy [facility] for tasks assessed/performed      Past Medical History:  Diagnosis Date  . Arthritis   . BPH (benign prostatic hyperplasia)   . Clinical depression 10/23/2014  . Depression   . Erectile dysfunction   . Hyperlipidemia   . Hypogonadism in male   . Lower urinary tract infection   . Premature ejaculation   . Prostate cancer ([place])   . Stroke ([place])   . TIA (transient ischemic attack)     Past Surgical History:  Procedure Laterality Date  . COLONOSCOPY WITH PROPOFOL N/A 01/26/2016   Procedure: COLONOSCOPY WITH PROPOFOL;  Surgeon: Lollie Sails, MD;  Location: [facility] ENDOSCOPY;  Service: Endoscopy;  Laterality: N/A;  . ESOPHAGOGASTRODUODENOSCOPY (EGD) WITH PROPOFOL N/A 01/26/2016   Procedure: ESOPHAGOGASTRODUODENOSCOPY (EGD) WITH PROPOFOL;  Surgeon: Lollie Sails, MD;  Location: [facility] ENDOSCOPY;  Service: Endoscopy;  Laterality: N/A;  . TONSILLECTOMY      Vitals:   07/04/16 1052  BP: (!) 137/97  SpO2: 100%        Subjective Assessment - 07/04/16 1052    Subjective Pt reports 8/10 R hip and LBP today.  He says he can only stay for 25 minutes today as his mother has dementia and is going through a hard time.  He reports he has been doing his HEP 1x/day.   Patient is accompained by:  Family member   Pertinent History Cervical spondylosisLumbar spondylosis see History above   Patient Stated Goals to walk better   Currently in Pain? Yes   Pain Score 8    Pain Location Hip   Pain Orientation Right   Pain Descriptors / Indicators Aching   Pain Type Chronic pain   Pain Onset More than a month ago   Multiple Pain Sites Yes   Pain Score 8   Pain Location Back   Pain Orientation Lower   Pain Descriptors / Indicators Aching   Pain Type Chronic pain   Pain Onset More than a month ago         TREATMENT   Therapeutic exercise: BLE leg press 3x15, 120#. Cues for eccentric lowering.  Sideways walking x2 lengths each direction in // bars with slight knee bend for glute activation; however pt reports increasing back and hip pain and exercise d/c.  Side steps up to 8" step 1x15 each direction with occasional min guard and pt reaching out for // bars  Sit<>stand x5 with cues for no use of UEs and to scoot to edge of seat prior to standing   Neuromuscular Rehab: Alternating toe taps from purple foam up to 8" step, 2x20. Pt fatigues quickly with tremulous RLE during SLS  Tandem stance on airex 1x30 seconds with L foot forward and then R foot  forward  Sideways walking on airex x2 lengths in // bars in each direction, pt occasionally reaching out for // bars for support.                        PT Education - 07/04/16 1132    Education provided Yes   Education Details Exercise technique   Person(s) Educated Patient   Methods Explanation;Demonstration;Verbal cues   Comprehension Verbalized understanding;Returned demonstration             PT Long Term Goals - 05/24/16 1217      PT LONG TERM GOAL #1   Title Patient will be independent with HEP and improve his functional ambulation .   Time 8   Period Weeks   Status New     PT LONG TERM GOAL #2   Title Patient will improve his 5 x  sit to stand to indicate decreased falls risk.   Time 8    Period Weeks   Status New     PT LONG TERM GOAL #3   Title Patient will improve his 61 MW test to indicate decreased falls risk.    Time 8   Period Weeks   Status New     PT LONG TERM GOAL #4   Title Patient will improve 6 MW test to 1000 feet in order to be a community ambulator   Time 8   Period Weeks   Status New     PT LONG TERM GOAL #5   Title Patient will increase BLE strength to 4+/5 to improve overall mobility and decrease his falls risk.   Time 8   Period Weeks   Status New               Plan - 07/04/16 1132    Clinical Impression Statement Pt fatigues quickly with therapeutic exercise and balance interventions.  He requires cues for exercise technique and safety using // bars for support. He will benefit from skilled PT services for continued strengthening and balance exercises.   Rehab Potential Fair   PT Frequency 2x / week   PT Duration 4 weeks   PT Treatment/Interventions Manual techniques;Patient/family education;Neuromuscular re-education;Balance training;Therapeutic exercise;Therapeutic activities;Moist Heat;Gait training   PT Next Visit Plan balance and strengthening of LE   PT Home Exercise Plan LE exercises   Consulted and Agree with Plan of Care Patient      Patient will benefit from skilled therapeutic intervention in order to improve the following deficits and impairments:  Decreased cognition, Abnormal gait, Difficulty walking, Cardiopulmonary status limiting activity, Decreased endurance, Decreased activity tolerance, Pain, Decreased balance, Impaired flexibility, Decreased mobility, Decreased strength, Impaired sensation  Visit Diagnosis: Difficulty waking     Problem List Patient Active Problem List   Diagnosis Date Noted  . Osteoarthritis of hips (Bilateral) 02/08/2016  . Lumbar facet syndrome (Location of Primary Source of Pain) (Bilateral) (R>L) 02/08/2016  . Abnormal MRI, thoracic spine (07/23/2015) 11/19/2015  .  osteoarthritis  of hip (Right) 11/03/2015  . Chronic lower extremity pain (Right) 10/29/2015  . History of prostate cancer 10/29/2015  . Lumbar facet arthropathy 10/29/2015  . 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) 10/29/2015  . Neuropathic pain 10/28/2015  . 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 Substance use disorder Risk: LOW 07/29/2015  . Chronic upper back pain 07/29/2015  . Cervical spondylosis 07/29/2015  . Chronic neck pain 07/29/2015  . Cervicogenic headache 07/29/2015  . Chronic radicular cervical pain  07/29/2015  . Chronic hip pain (Location of Secondary source of pain) (Right) 07/29/2015  . Generalized anxiety disorder 07/29/2015  . Depression 07/29/2015  . At high risk for falls 07/29/2015  . Erectile dysfunction 07/29/2015  . History of TIA (transient ischemic attack) 07/29/2015  . Syrinx of spinal cord ([place]) from T7-8 through T9-10 without associated mass lesion or cord expansion. 07/29/2015  . Abnormal MRI, lumbar spine (06/26/2015) 07/29/2015  . Lumbar spondylosis 07/29/2015  . Long term current use of opiate analgesic 07/28/2015  . Long term prescription opiate use 07/28/2015  . Opiate use (15 MME/Day) 07/28/2015  . Opiate dependence ([place]) 07/28/2015  . Encounter for therapeutic drug level monitoring 07/28/2015  . Chronic pain 07/28/2015  . Chronic low back pain (Location of Primary Source of Pain) (Bilateral) (R>L) 07/28/2015  . Chronic lumbar radicular pain (Polyradiculopathy) (Right) 07/28/2015  . Ataxia 07/28/2015  . Arm numbness 05/29/2015  . HLD (hyperlipidemia) 04/23/2015  . Difficulty in walking 12/16/2014  . Difficulty in walking, not elsewhere classified 12/16/2014  . Compulsive tobacco user syndrome 10/23/2014  . Hypercholesterolemia without hypertriglyceridemia 10/23/2014  . Current tobacco use 10/23/2014  . Pure hypercholesterolemia 10/23/2014  . Clinical depression 10/23/2014  . Mild chronic obstructive pulmonary disease  ([place]) 06/18/2014    Collie Siad PT, DPT 07/04/2016, 11:35 AM  [place] MAIN [facility] SERVICES [address] [place], [place], [address] Phone: [phone]   Fax:  [phone]  Name: Mark Hodges MRN: [id number] Date of Birth: [birth year]

## 2016-07-07 ENCOUNTER — Ambulatory Visit: Payer: BLUE CROSS/BLUE SHIELD | Admitting: Physical Therapy

## 2016-08-10 ENCOUNTER — Ambulatory Visit (HOSPITAL_BASED_OUTPATIENT_CLINIC_OR_DEPARTMENT_OTHER): Payer: BLUE CROSS/BLUE SHIELD | Admitting: Pain Medicine

## 2016-08-10 ENCOUNTER — Ambulatory Visit
Admission: RE | Admit: 2016-08-10 | Discharge: 2016-08-10 | Disposition: A | Discharge status: Home / Self Care | Payer: BLUE CROSS/BLUE SHIELD | Source: Ambulatory Visit | Attending: Pain Medicine | Admitting: Pain Medicine

## 2016-08-10 ENCOUNTER — Encounter: Payer: Self-pay | Admitting: Pain Medicine

## 2016-08-10 VITALS — BP 138/83 | HR 79 | Temp 97.6°F | Resp 13 | Ht 68.0 in | Wt 145.0 lb

## 2016-08-10 DIAGNOSIS — M545 Low back pain: Secondary | ICD-10-CM | POA: Insufficient documentation

## 2016-08-10 DIAGNOSIS — G8918 Other acute postprocedural pain: Secondary | ICD-10-CM | POA: Diagnosis not present

## 2016-08-10 DIAGNOSIS — Z9889 Other specified postprocedural states: Secondary | ICD-10-CM | POA: Insufficient documentation

## 2016-08-10 DIAGNOSIS — G8929 Other chronic pain: Secondary | ICD-10-CM | POA: Insufficient documentation

## 2016-08-10 DIAGNOSIS — M1288 Other specific arthropathies, not elsewhere classified, other specified site: Secondary | ICD-10-CM | POA: Diagnosis not present

## 2016-08-10 DIAGNOSIS — M47816 Spondylosis without myelopathy or radiculopathy, lumbar region: Secondary | ICD-10-CM

## 2016-08-10 MED ORDER — TRIAMCINOLONE ACETONIDE 40 MG/ML IJ SUSP
INTRAMUSCULAR | Status: AC
  Administered 2016-08-10: 12:00:00
  Filled 2016-08-10: qty 1

## 2016-08-10 MED ORDER — LIDOCAINE HCL (PF) 1 % IJ SOLN: 10.0000 mL | Freq: Once | INTRAMUSCULAR | Status: DC

## 2016-08-10 MED ORDER — OXYCODONE HCL 5 MG PO TABS: 5.0000 mg | ORAL_TABLET | Freq: Four times a day (QID) | ORAL | 0 refills | Status: DC | PRN

## 2016-08-10 MED ORDER — MIDAZOLAM HCL 5 MG/5ML IJ SOLN: 1.0000 mg | INTRAMUSCULAR | Status: DC | PRN

## 2016-08-10 MED ORDER — FENTANYL CITRATE (PF) 100 MCG/2ML IJ SOLN
INTRAMUSCULAR | Status: AC
  Administered 2016-08-10: 100 ug
  Filled 2016-08-10: qty 2

## 2016-08-10 MED ORDER — FENTANYL CITRATE (PF) 100 MCG/2ML IJ SOLN: 25.0000 ug | INTRAMUSCULAR | Status: DC | PRN

## 2016-08-10 MED ORDER — MIDAZOLAM HCL 5 MG/5ML IJ SOLN
INTRAMUSCULAR | Status: AC
  Administered 2016-08-10: 2 mg
  Filled 2016-08-10: qty 5

## 2016-08-10 MED ORDER — LIDOCAINE HCL (PF) 1 % IJ SOLN
INTRAMUSCULAR | Status: AC
  Administered 2016-08-10: 12:00:00
  Filled 2016-08-10: qty 5

## 2016-08-10 MED ORDER — SODIUM CHLORIDE 0.9 % IJ SOLN
INTRAMUSCULAR | Status: AC
  Administered 2016-08-10: 12:00:00
  Filled 2016-08-10: qty 10

## 2016-08-10 MED ORDER — TRIAMCINOLONE ACETONIDE 40 MG/ML IJ SUSP: 40.0000 mg | Freq: Once | INTRAMUSCULAR | Status: DC

## 2016-08-10 MED ORDER — ROPIVACAINE HCL 2 MG/ML IJ SOLN
INTRAMUSCULAR | Status: AC
Start: 2016-08-10 — End: 2016-08-10
  Administered 2016-08-10: 12:00:00
  Filled 2016-08-10: qty 10

## 2016-08-10 MED ORDER — ROPIVACAINE HCL 2 MG/ML IJ SOLN: 9.0000 mL | Freq: Once | INTRAMUSCULAR | Status: DC

## 2016-08-10 MED ORDER — LACTATED RINGERS IV SOLN: 1000.0000 mL | Freq: Once | INTRAVENOUS | Status: DC

## 2016-08-10 NOTE — Patient Instructions (Signed)
Radiofrequency Lesioning Introduction Radiofrequency lesioning is a procedure that is performed to relieve pain. The procedure is often used for back, neck, or arm pain. Radiofrequency lesioning involves the use of a machine that creates radio waves to make heat. During the procedure, the heat is applied to the nerve that carries the pain signal. The heat damages the nerve and interferes with the pain signal. Pain relief usually starts about 2 weeks after the procedure and lasts for 6 months to 1 year. Tell a health care provider about:  Any allergies you have.  All medicines you are taking, including vitamins, herbs, eye drops, creams, and over-the-counter medicines.  Any problems you or family members have had with anesthetic medicines.  Any blood disorders you have.  Any surgeries you have had.  Any medical conditions you have.  Whether you are pregnant or may be pregnant. What are the risks? Generally, this is a safe procedure. However, problems may occur, including:  Pain or soreness at the injection site.  Infection at the injection site.  Damage to nerves or blood vessels. What happens before the procedure?  Ask your health care provider about:  Changing or stopping your regular medicines. This is especially important if you are taking diabetes medicines or blood thinners.  Taking medicines such as aspirin and ibuprofen. These medicines can thin your blood. Do not take these medicines before your procedure if your health care provider instructs you not to.  Follow instructions from your health care provider about eating or drinking restrictions.  Plan to have someone take you home after the procedure.  If you go home right after the procedure, plan to have someone with you for 24 hours. What happens during the procedure?  You will be given one or more of the following:  A medicine to help you relax (sedative).  A medicine to numb the area (local anesthetic).  You  will be awake during the procedure. You will need to be able to talk with the health care provider during the procedure.  With the help of a type of X-ray (fluoroscopy), the health care provider will insert a radiofrequency needle into the area to be treated.  Next, a wire that carries the radio waves (electrode) will be put through the radiofrequency needle. An electrical pulse will be sent through the electrode to verify the correct nerve. You will feel a tingling sensation, and you may have muscle twitching.  Then, the tissue that is around the needle tip will be heated by an electric current that is passed using the radiofrequency machine. This will numb the nerves.  A bandage (dressing) will be put on the insertion area after the procedure is done. The procedure may vary among health care providers and hospitals. What happens after the procedure?  Your blood pressure, heart rate, breathing rate, and blood oxygen level will be monitored often until the medicines you were given have worn off.  Return to your normal activities as directed by your health care provider. This information is not intended to replace advice given to you by your health care provider. Make sure you discuss any questions you have with your health care provider. Document Released: 05/11/2011 Document Revised: 02/18/2016 Document Reviewed: 10/20/2014  2017 [employer] Radiofrequency Lesioning, Care After Introduction Refer to this sheet in the next few weeks. These instructions provide you with information about caring for yourself after your procedure. Your health care provider may also give you more specific instructions. Your treatment has been planned according to  current medical practices, but problems sometimes occur. Call your health care provider if you have any problems or questions after your procedure. What can I expect after the procedure? After the procedure, it is common to have:  Pain from the burned  nerve.  Temporary numbness. Follow these instructions at home:  Take over-the-counter and prescription medicines only as told by your health care provider.  Return to your normal activities as told by your health care provider. Ask your health care provider what activities are safe for you.  Pay close attention to how you feel after the procedure. If you start to have pain, write down when it hurts and how it feels. This will help you and your health care provider to know if you need an additional treatment.  Check your needle insertion site every day for signs of infection. Watch for:  Redness, swelling, or pain.  Fluid, blood, or pus.  Keep all follow-up visits as told by your health care provider. This is important. Contact a health care provider if:  Your pain does not get better.  You have redness, swelling, or pain at the needle insertion site.  You have fluid, blood, or pus coming from the needle insertion site.  You have a fever. Get help right away if:  You develop sudden, severe pain.  You develop numbness or tingling near the procedure site that does not go away. This information is not intended to replace advice given to you by your health care provider. Make sure you discuss any questions you have with your health care provider. Document Released: 05/12/2011 Document Revised: 02/18/2016 Document Reviewed: 10/20/2014  2017 [employer]

## 2016-08-10 NOTE — Progress Notes (Signed)
Patient's Name: Mark Hodges  MRN: [id number]  Referring Provider: Tracie Harrier, MD  DOB: August 29, 1956  PCP: Tracie Harrier, MD  DOS: 08/10/2016  Note by: Kathlen Brunswick. Dossie Arbour, MD  Service setting: Ambulatory outpatient  Location: [facility] (AMB) Pain Management Facility  Visit type: Procedure  Specialty: Interventional Pain Management  Patient type: Established   Primary Reason for Visit: Interventional Pain Management Treatment. CC: No chief complaint on file.  Procedure:  Anesthesia, Analgesia, Anxiolysis:  Type: Therapeutic Medial Branch Facet Radiofrequency Ablation Region: Lumbar Level: L2, L3, L4, L5, & S1 Medial Branch Level(s) Laterality: Right-Sided  Type: Local Anesthesia with Moderate (Conscious) Sedation Local Anesthetic: Lidocaine 1% Route: Intravenous (IV) IV Access: Secured Sedation: Meaningful verbal contact was maintained at all times during the procedure  Indication(s): Analgesia and Anxiety  Indications: 1. Lumbar facet syndrome (Location of Primary Source of Pain) (Bilateral) (R>L)   2. Lumbar facet arthropathy   3. Chronic low back pain (Location of Primary Source of Pain) (Bilateral) (R>L)   4. Acute postoperative pain    Mark Hodges has either failed to respond, was unable to tolerate, or simply did not get enough benefit from other more conservative therapies including, but not limited to: 1. Over-the-counter medications 2. Anti-inflammatory medications 3. Muscle relaxants 4. Membrane stabilizers 5. Opioids 6. Physical therapy 7. Modalities (Heat, ice, etc.) 8. Invasive techniques such as nerve blocks. Mark Hodges has attained more than 50% relief of the pain from a series of diagnostic injections conducted in separate occasions.  Pain Score: Pre-procedure: 5 /10 Post-procedure: 0-No pain/10  Pre-Procedure Assessment:  Mark Hodges is a 60 y.o. (year old), male patient, seen today for interventional treatment. He  has a past surgical history that  includes Tonsillectomy; Colonoscopy with propofol (N/A, 01/26/2016); and Esophagogastroduodenoscopy (egd) with propofol (N/A, 01/26/2016).. His primarily concern today is the No chief complaint on file. The primary encounter diagnosis was Lumbar facet syndrome (Location of Primary Source of Pain) (Bilateral) (R>L). Diagnoses of Lumbar facet arthropathy, Chronic low back pain (Location of Primary Source of Pain) (Bilateral) (R>L), and Acute postoperative pain were also pertinent to this visit.  Pain Type: Chronic pain Pain Location: Back Pain Orientation: Right, Left (worse on the right.) Pain Descriptors / Indicators: Sharp, Shooting Pain Frequency: Constant  Date of Last Visit: 06/27/16 Service Provided on Last Visit: Med Refill  Coagulation Parameters No results found for: INR, LABPROT, APTT, PLT Verification of the correct person, correct site (including marking of site), and correct procedure were performed and confirmed by the patient.  Consent: Before the procedure and under the influence of no sedative(s), amnesic(s), or anxiolytics, the patient was informed of the treatment options, risks and possible complications. To fulfill our ethical and legal obligations, as recommended by the American Medical Association's Code of Ethics, I have informed the patient of my clinical impression; the nature and purpose of the treatment or procedure; the risks, benefits, and possible complications of the intervention; the alternatives, including doing nothing; the risk(s) and benefit(s) of the alternative treatment(s) or procedure(s); and the risk(s) and benefit(s) of doing nothing. The patient was provided information about the general risks and possible complications associated with the procedure. These may include, but are not limited to: failure to achieve desired goals, infection, bleeding, organ or nerve damage, allergic reactions, paralysis, and death. In addition, the patient was informed of those  risks and complications associated to Spine-related procedures, such as failure to decrease pain; infection (i.e.: Meningitis, epidural or intraspinal abscess); bleeding (i.e.: epidural hematoma,  subarachnoid hemorrhage, or any other type of intraspinal or peri-dural bleeding); organ or nerve damage (i.e.: Any type of peripheral nerve, nerve root, or spinal cord injury) with subsequent damage to sensory, motor, and/or autonomic systems, resulting in permanent pain, numbness, and/or weakness of one or several areas of the body; allergic reactions; (i.e.: anaphylactic reaction); and/or death. Furthermore, the patient was informed of those risks and complications associated with the medications. These include, but are not limited to: allergic reactions (i.e.: anaphylactic or anaphylactoid reaction(s)); adrenal axis suppression; blood sugar elevation that in diabetics may result in ketoacidosis or comma; water retention that in patients with history of congestive heart failure may result in shortness of breath, pulmonary edema, and decompensation with resultant heart failure; weight gain; swelling or edema; medication-induced neural toxicity; particulate matter embolism and blood vessel occlusion with resultant organ, and/or nervous system infarction; and/or aseptic necrosis of one or more joints. Finally, the patient was informed that Medicine is not an exact science; therefore, there is also the possibility of unforeseen or unpredictable risks and/or possible complications that may result in a catastrophic outcome. The patient indicated having understood very clearly. We have given the patient no guarantees and we have made no promises. Enough time was given to the patient to ask questions, all of which were answered to the patient's satisfaction. Mark Hodges has indicated that he wanted to continue with the procedure.  Consent Attestation: I, the ordering provider, attest that I have discussed with the patient  the benefits, risks, side-effects, alternatives, likelihood of achieving goals, and potential problems during recovery for the procedure that I have provided informed consent.  Pre-Procedure Preparation:  Safety Precautions: Allergies reviewed. The patient was asked about blood thinners, or active infections, both of which were denied. The patient was asked to confirm the procedure and laterality, before marking the site, and again before commencing the procedure. Appropriate site, procedure, and patient were confirmed by following the Joint Commission's Universal Protocol (UP.01.01.01), in the form of a "Time Out". The patient was asked to participate by confirming the accuracy of the "Time Out" information. Patient was assessed for positional comfort and pressure points before starting the procedure. Allergies: He has No Known Allergies. Allergy Precautions: None required Infection Control Precautions: Sterile technique used. Standard Universal Precautions were taken as recommended by the Department of [facility] for Disease Control and Prevention (CDC). Standard pre-surgical skin prep was conducted. Respiratory hygiene and cough etiquette was practiced. Hand hygiene observed. Safe injection practices and needle disposal techniques followed. SDV (single dose vial) medications used. Medications properly checked for expiration dates and contaminants. Personal protective equipment (PPE) used as per protocol. Monitoring:  As per clinic protocol. Vitals:   08/10/16 1240 08/10/16 1246 08/10/16 1256 08/10/16 1306  BP: 126/68 130/83 (!) 131/92 138/83  Pulse: 85 80 78 79  Resp: 18 16 16 13   Temp:  97.8 F (36.6 C)  97.6 F (36.4 C)  TempSrc:      SpO2: 99% 100% 98% 99%  Weight:      Height:      Calculated BMI: Body mass index is 22.05 kg/m. Time-out: "Time-out" completed before starting procedure, as per protocol.  Description of Procedure Process:   Time-out: "Time-out" completed  before starting procedure, as per protocol. Position: Prone Target Area: For Lumbar Facet blocks, the target is the groove formed by the junction of the transverse process and superior articular process. For the L5 dorsal ramus, the target is the notch between superior articular process  and sacral ala. For the S1 dorsal ramus, the target is the superior and lateral edge of the posterior S1 Sacral foramen. Approach: Paraspinal approach. Area Prepped: Entire Posterior Lumbosacral Region Prepping solution: ChloraPrep (2% chlorhexidine gluconate and 70% isopropyl alcohol) Safety Precautions: Aspiration looking for blood return was conducted prior to all injections. At no point did we inject any substances, as a needle was being advanced. No attempts were made at seeking any paresthesias. Safe injection practices and needle disposal techniques used. Medications properly checked for expiration dates. SDV (single dose vial) medications used. Description of the Procedure: Protocol guidelines were followed. The patient was placed in position over the fluoroscopy table. The target area was identified and the area prepped in the usual manner. Skin desensitized using vapocoolant spray. Skin & deeper tissues infiltrated with local anesthetic. Appropriate amount of time allowed to pass for local anesthetics to take effect. Radiofrequency needles were introduced to the area of the medial branch at the junction of the superior articular process and transverse process using fluoroscopy. Using the [employer], sensory stimulation using 50 Hz was used to locate & identify the nerve, making sure that the needle was positioned such that there was no sensory stimulation below 0.3 V or above 0.7 V. Stimulation using 2 Hz was used to evaluate the motor component. Care was taken not to lesion any nerves that demonstrated motor stimulation of the lower extremities at an output of less than 2.5 times that of the  sensory threshold, or a maximum of 2.0 V. Once satisfactory placement of the needles was achieved, the above solution was slowly injected after negative aspiration. After waiting for at least 2 minutes, the ablation was performed at 80 degrees C for 60 seconds.The needles were then removed and the area cleansed, making sure to leave some of the prepping solution back to take advantage of its long term bactericidal properties. EBL: None Materials & Medications Used:  Needle(s) Used: Teflon-Coated Radiofrequency Needles  Imaging Guidance (Spinal):  Type of Imaging Technique: Fluoroscopy Guidance (Spinal) Indication(s): Assistance in needle guidance and placement for procedures requiring needle placement in or near specific anatomical locations not easily accessible without such assistance. Exposure Time: Please see nurses notes. Contrast: None used. Fluoroscopic Guidance: I was personally present during the use of fluoroscopy. "Tunnel Vision Technique" used to obtain the best possible view of the target area. Parallax error corrected before commencing the procedure. "Direction-depth-direction" technique used to introduce the needle under continuous pulsed fluoroscopy. Once target was reached, antero-posterior, oblique, and lateral fluoroscopic projection used confirm needle placement in all planes. Images permanently stored in EMR. Interpretation: No contrast injected. I personally interpreted the imaging intraoperatively. Adequate needle placement confirmed in multiple planes. Permanent images saved into the patient's record.  Antibiotic Prophylaxis:  Indication(s): No indications identified. Type:  Antibiotics Given (last 72 hours)    None      Post-operative Assessment:  Complications: No immediate post-treatment complications observed by team, or reported by patient. Disposition: The patient tolerated the entire procedure well. A repeat set of vitals were taken after the procedure and the  patient was kept under observation following institutional policy, for this type of procedure. Post-procedural neurological assessment was performed, showing return to baseline, prior to discharge. The patient was provided with post-procedure discharge instructions, including a section on how to identify potential problems. Should any problems arise concerning this procedure, the patient was given instructions to immediately contact us, at any time, without hesitation. In any case, we plan to  contact the patient by telephone for a follow-up status report regarding this interventional procedure. Comments:  No additional relevant information.  Plan of Care  Discharge to: Discharge home  Medications ordered for procedure: Meds ordered this encounter  Medications  . fentaNYL (SUBLIMAZE) injection 25-50 mcg    Make sure Narcan is available in the pyxis when using this medication. In the event of respiratory depression (RR< 8/min): Titrate NARCAN (naloxone) in increments of 0.1 to 0.2 mg IV at 2-3 minute intervals, until desired degree of reversal.  . lactated ringers infusion 1,000 mL  . midazolam (VERSED) 5 MG/5ML injection 1-2 mg    Make sure Flumazenil is available in the pyxis when using this medication. If oversedation occurs, administer 0.2 mg IV over 15 sec. If after 45 sec no response, administer 0.2 mg again over 1 min; may repeat at 1 min intervals; not to exceed 4 doses (1 mg)  . triamcinolone acetonide (KENALOG-40) injection 40 mg  . lidocaine (PF) (XYLOCAINE) 1 % injection 10 mL  . ropivacaine (PF) 2 mg/ml (0.2%) (NAROPIN) epidural 9 mL  . lidocaine (PF) (XYLOCAINE) 1 % injection    GARNER, CYNTHIA: cabinet override  . sodium chloride 0.9 % injection    GARNER, CYNTHIA: cabinet override  . ropivacaine (PF) 2 mg/ml (0.2%) (NAROPIN) 2 MG/ML epidural    GARNER, CYNTHIA: cabinet override  . fentaNYL (SUBLIMAZE) 100 MCG/2ML injection    GARNER, CYNTHIA: cabinet override  . triamcinolone  acetonide (KENALOG-40) 40 MG/ML injection    GARNER, CYNTHIA: cabinet override  . midazolam (VERSED) 5 MG/5ML injection    GARNER, CYNTHIA: cabinet override  . oxyCODONE (OXY IR/ROXICODONE) 5 MG immediate release tablet    Sig: Take 1 tablet (5 mg total) by mouth every 6 (six) hours as needed for severe pain (For post-radiofrequency pain only.).    Dispense:  60 tablet    Refill:  0    Do not place this medication, or any other prescription from our practice, on "Automatic Refill". Patient may have prescription filled one day early if pharmacy is closed on scheduled refill date. Do not fill until: 08/10/16  To last until: 08/25/16 (Not to be refilled)   Medications administered: (For more details, see medical record) We administered lidocaine (PF), sodium chloride, ropivacaine (PF) 2 mg/ml (0.2%), fentaNYL, triamcinolone acetonide, and midazolam. Lab-work, Procedure(s), & Referral(s) Ordered: Orders Placed This Encounter  Procedures  . Radiofrequency,Lumbar  . Radiofrequency,Lumbar  . DG C-Arm 1-60 Min-No Report   Imaging Ordered: No results found for this or any previous visit. New Prescriptions   OXYCODONE (OXY IR/ROXICODONE) 5 MG IMMEDIATE RELEASE TABLET    Take 1 tablet (5 mg total) by mouth every 6 (six) hours as needed for severe pain (For post-radiofrequency pain only.).   Physician-requested Follow-up:  Return in about 6 weeks (around 09/21/2016) for Post-Procedure evaluation, procedure.  Future Appointments Date Time Provider [place]  09/29/2016 10:00 AM Milinda Pointer, MD [facility] None  10/05/2016 1:00 PM Milinda Pointer, MD [facility] None  10/14/2016 9:30 AM [facility] None  10/21/2016 8:45 AM Hollice Espy, MD [facility] None  10/31/2016 10:45 AM Milinda Pointer, MD [facility] None   Primary Care Physician: Tracie Harrier, MD Location: [facility] Note by: Kathlen Brunswick. Dossie Arbour, M.D, DABA, DABAPM, DABPM, DABIPP,  FIPP  Disclaimer:  Medicine is not an exact science. The only guarantee in medicine is that nothing is guaranteed. It is important to note that the decision to proceed with this intervention was based on  the information collected from the patient. The Data and conclusions were drawn from the patient's questionnaire, the interview, and the physical examination. Because the information was provided in large part by the patient, it cannot be guaranteed that it has not been purposely or unconsciously manipulated. Every effort has been made to obtain as much relevant data as possible for this evaluation. It is important to note that the conclusions that lead to this procedure are derived in large part from the available data. Always take into account that the treatment will also be dependent on availability of resources and existing treatment guidelines, considered by other Pain Management Practitioners as being common knowledge and practice, at the time of the intervention. For Medico-Legal purposes, it is also important to point out that variation in procedural techniques and pharmacological choices are the acceptable norm. The indications, contraindications, technique, and results of the above procedure should only be interpreted and judged by a Board-Certified Interventional Pain Specialist with extensive familiarity and expertise in the same exact procedure and technique. Attempts at providing opinions without similar or greater experience and expertise than that of the treating physician will be considered as inappropriate and unethical, and shall result in a formal complaint to the state medical board and applicable specialty societies.  Instructions provided at this appointment: Patient Instructions  Radiofrequency Lesioning Introduction Radiofrequency lesioning is a procedure that is performed to relieve pain. The procedure is often used for back, neck, or arm pain. Radiofrequency lesioning involves the  use of a machine that creates radio waves to make heat. During the procedure, the heat is applied to the nerve that carries the pain signal. The heat damages the nerve and interferes with the pain signal. Pain relief usually starts about 2 weeks after the procedure and lasts for 6 months to 1 year. Tell a health care provider about:  Any allergies you have.  All medicines you are taking, including vitamins, herbs, eye drops, creams, and over-the-counter medicines.  Any problems you or family members have had with anesthetic medicines.  Any blood disorders you have.  Any surgeries you have had.  Any medical conditions you have.  Whether you are pregnant or may be pregnant. What are the risks? Generally, this is a safe procedure. However, problems may occur, including:  Pain or soreness at the injection site.  Infection at the injection site.  Damage to nerves or blood vessels. What happens before the procedure?  Ask your health care provider about:  Changing or stopping your regular medicines. This is especially important if you are taking diabetes medicines or blood thinners.  Taking medicines such as aspirin and ibuprofen. These medicines can thin your blood. Do not take these medicines before your procedure if your health care provider instructs you not to.  Follow instructions from your health care provider about eating or drinking restrictions.  Plan to have someone take you home after the procedure.  If you go home right after the procedure, plan to have someone with you for 24 hours. What happens during the procedure?  You will be given one or more of the following:  A medicine to help you relax (sedative).  A medicine to numb the area (local anesthetic).  You will be awake during the procedure. You will need to be able to talk with the health care provider during the procedure.  With the help of a type of X-ray (fluoroscopy), the health care provider will insert  a radiofrequency needle into the area to be treated.  Next, a wire that carries the radio waves (electrode) will be put through the radiofrequency needle. An electrical pulse will be sent through the electrode to verify the correct nerve. You will feel a tingling sensation, and you may have muscle twitching.  Then, the tissue that is around the needle tip will be heated by an electric current that is passed using the radiofrequency machine. This will numb the nerves.  A bandage (dressing) will be put on the insertion area after the procedure is done. The procedure may vary among health care providers and hospitals. What happens after the procedure?  Your blood pressure, heart rate, breathing rate, and blood oxygen level will be monitored often until the medicines you were given have worn off.  Return to your normal activities as directed by your health care provider. This information is not intended to replace advice given to you by your health care provider. Make sure you discuss any questions you have with your health care provider. Document Released: 05/11/2011 Document Revised: 02/18/2016 Document Reviewed: 10/20/2014  2017 [employer] Radiofrequency Lesioning, Care After Introduction Refer to this sheet in the next few weeks. These instructions provide you with information about caring for yourself after your procedure. Your health care provider may also give you more specific instructions. Your treatment has been planned according to current medical practices, but problems sometimes occur. Call your health care provider if you have any problems or questions after your procedure. What can I expect after the procedure? After the procedure, it is common to have:  Pain from the burned nerve.  Temporary numbness. Follow these instructions at home:  Take over-the-counter and prescription medicines only as told by your health care provider.  Return to your normal activities as told by your  health care provider. Ask your health care provider what activities are safe for you.  Pay close attention to how you feel after the procedure. If you start to have pain, write down when it hurts and how it feels. This will help you and your health care provider to know if you need an additional treatment.  Check your needle insertion site every day for signs of infection. Watch for:  Redness, swelling, or pain.  Fluid, blood, or pus.  Keep all follow-up visits as told by your health care provider. This is important. Contact a health care provider if:  Your pain does not get better.  You have redness, swelling, or pain at the needle insertion site.  You have fluid, blood, or pus coming from the needle insertion site.  You have a fever. Get help right away if:  You develop sudden, severe pain.  You develop numbness or tingling near the procedure site that does not go away. This information is not intended to replace advice given to you by your health care provider. Make sure you discuss any questions you have with your health care provider. Document Released: 05/12/2011 Document Revised: 02/18/2016 Document Reviewed: 10/20/2014  2017 [employer]vier

## 2016-08-11 ENCOUNTER — Telehealth: Payer: Self-pay | Admitting: *Deleted

## 2016-08-11 NOTE — Telephone Encounter (Signed)
Attempted to call patient for post procedure follow-up. Message left. 

## 2016-09-28 ENCOUNTER — Ambulatory Visit: Payer: BLUE CROSS/BLUE SHIELD | Admitting: Pain Medicine

## 2016-09-29 ENCOUNTER — Ambulatory Visit: Payer: BLUE CROSS/BLUE SHIELD | Admitting: Pain Medicine

## 2016-10-05 ENCOUNTER — Encounter: Payer: Self-pay | Admitting: Pain Medicine

## 2016-10-05 ENCOUNTER — Ambulatory Visit: Payer: Medicare Other | Attending: Pain Medicine | Admitting: Pain Medicine

## 2016-10-05 VITALS — BP 115/75 | HR 109 | Temp 98.5°F | Resp 20 | Ht 68.0 in | Wt 150.0 lb

## 2016-10-05 DIAGNOSIS — M1288 Other specific arthropathies, not elsewhere classified, other specified site: Secondary | ICD-10-CM | POA: Diagnosis not present

## 2016-10-05 DIAGNOSIS — F1190 Opioid use, unspecified, uncomplicated: Secondary | ICD-10-CM | POA: Diagnosis not present

## 2016-10-05 DIAGNOSIS — F329 Major depressive disorder, single episode, unspecified: Secondary | ICD-10-CM | POA: Diagnosis not present

## 2016-10-05 DIAGNOSIS — Z7982 Long term (current) use of aspirin: Secondary | ICD-10-CM | POA: Diagnosis not present

## 2016-10-05 DIAGNOSIS — J449 Chronic obstructive pulmonary disease, unspecified: Secondary | ICD-10-CM | POA: Diagnosis not present

## 2016-10-05 DIAGNOSIS — G8929 Other chronic pain: Secondary | ICD-10-CM | POA: Diagnosis not present

## 2016-10-05 DIAGNOSIS — N400 Enlarged prostate without lower urinary tract symptoms: Secondary | ICD-10-CM | POA: Diagnosis not present

## 2016-10-05 DIAGNOSIS — G950 Syringomyelia and syringobulbia: Secondary | ICD-10-CM | POA: Insufficient documentation

## 2016-10-05 DIAGNOSIS — Z8546 Personal history of malignant neoplasm of prostate: Secondary | ICD-10-CM | POA: Diagnosis not present

## 2016-10-05 DIAGNOSIS — M25551 Pain in right hip: Secondary | ICD-10-CM | POA: Insufficient documentation

## 2016-10-05 DIAGNOSIS — G894 Chronic pain syndrome: Secondary | ICD-10-CM | POA: Insufficient documentation

## 2016-10-05 DIAGNOSIS — M545 Low back pain, unspecified: Secondary | ICD-10-CM

## 2016-10-05 DIAGNOSIS — E7800 Pure hypercholesterolemia, unspecified: Secondary | ICD-10-CM | POA: Insufficient documentation

## 2016-10-05 DIAGNOSIS — Z87891 Personal history of nicotine dependence: Secondary | ICD-10-CM | POA: Insufficient documentation

## 2016-10-05 DIAGNOSIS — M160 Bilateral primary osteoarthritis of hip: Secondary | ICD-10-CM | POA: Insufficient documentation

## 2016-10-05 DIAGNOSIS — M792 Neuralgia and neuritis, unspecified: Secondary | ICD-10-CM | POA: Diagnosis not present

## 2016-10-05 DIAGNOSIS — Z79891 Long term (current) use of opiate analgesic: Secondary | ICD-10-CM | POA: Diagnosis not present

## 2016-10-05 DIAGNOSIS — M5410 Radiculopathy, site unspecified: Secondary | ICD-10-CM

## 2016-10-05 DIAGNOSIS — F411 Generalized anxiety disorder: Secondary | ICD-10-CM | POA: Diagnosis not present

## 2016-10-05 DIAGNOSIS — N529 Male erectile dysfunction, unspecified: Secondary | ICD-10-CM | POA: Diagnosis not present

## 2016-10-05 DIAGNOSIS — M5386 Other specified dorsopathies, lumbar region: Secondary | ICD-10-CM | POA: Insufficient documentation

## 2016-10-05 DIAGNOSIS — M48061 Spinal stenosis, lumbar region without neurogenic claudication: Secondary | ICD-10-CM | POA: Diagnosis not present

## 2016-10-05 DIAGNOSIS — M47816 Spondylosis without myelopathy or radiculopathy, lumbar region: Secondary | ICD-10-CM

## 2016-10-05 DIAGNOSIS — Z8673 Personal history of transient ischemic attack (TIA), and cerebral infarction without residual deficits: Secondary | ICD-10-CM | POA: Insufficient documentation

## 2016-10-05 MED ORDER — OXYCODONE-ACETAMINOPHEN 10-325 MG PO TABS: 1.0000 | ORAL_TABLET | Freq: Every day | ORAL | 0 refills | Status: DC | PRN

## 2016-10-05 MED ORDER — GABAPENTIN 300 MG PO CAPS: 900.0000 mg | ORAL_CAPSULE | Freq: Three times a day (TID) | ORAL | 0 refills | Status: DC

## 2016-10-05 NOTE — Progress Notes (Signed)
Patient's Name: Mark Hodges  MRN: [id number]  Referring Provider: Tracie Harrier, MD  DOB: [birth year]  PCP: Tracie Harrier, MD  DOS: 10/05/2016  Note by: Kathlen Brunswick. Dossie Arbour, MD  Service setting: Ambulatory outpatient  Specialty: Interventional Pain Management  Location: [facility] (AMB) Pain Management Facility    Patient type: Established   Primary Reason(s) for Visit: Encounter for prescription drug management & post-procedure evaluation of chronic illness with mild to moderate exacerbation(Level of risk: moderate) CC: Back Pain (low) and Hip Pain (right)  HPI  Mark Hodges is a 61 y.o. year old, male patient, who comes today for a post-procedure evaluation and medication management. He has Difficulty in walking; Compulsive tobacco user syndrome; Hypercholesterolemia without hypertriglyceridemia; Long term current use of opiate analgesic; Long term prescription opiate use; Opiate use (15 MME/Day); Opiate dependence ([place]); Encounter for therapeutic drug level monitoring; Chronic low back pain (Location of Primary Source of Pain) (Bilateral) (R>L); Chronic lumbar radicular pain (Polyradiculopathy) (Right); Ataxia; HLD (hyperlipidemia); Current tobacco use; Pure hypercholesterolemia; Arm numbness; Substance use disorder Risk: LOW; Chronic upper back pain; Cervical spondylosis; Chronic neck pain; Cervicogenic headache; Chronic radicular cervical pain; Chronic hip pain (Location of Secondary source of pain) (Right); Generalized anxiety disorder; At high risk for falls; Erectile dysfunction; History of TIA (transient ischemic attack); 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 without associated mass lesion or cord expansion.; Abnormal MRI, lumbar spine (06/26/2015); Lumbar spondylosis; 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, by EMG/PNCV; Neuropathic pain; Chronic lower extremity pain (Right); History of prostate cancer; Lumbar facet arthropathy; 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 His primarily concern today is the Back Pain (low) and Hip Pain (right)  Pain Assessment: Self-Reported Pain Score: 5 /10 Clinically the patient looks like a 2/10 Reported level is inconsistent with clinical observations. Information on the proper use of the pain score provided to the patient today. Pain Location: Back Pain Orientation: Lower Pain Descriptors / Indicators: Stabbing, Burning Pain Frequency: Constant  Mark Hodges was last seen on 08/10/2016 for a procedure. During today's appointment we reviewed Mark Hodges's post-procedure results, as well as his outpatient medication regimen.  Further details on both, my assessment(s), as well as the proposed treatment plan, please see below.  Controlled Substance Pharmacotherapy Assessment REMS (Risk Evaluation and Mitigation Strategy)  Analgesic:Oxycodone/APAP 10/325 one daily MME/day:15 mg/day.  Angelique Holm, RN  10/05/2016  1:28 PM  Sign at close encounter Nursing Pain Medication Assessment:  Safety precautions to be maintained throughout the outpatient stay will include: orient to surroundings, keep bed in low position, maintain call bell within reach at all times, provide assistance with transfer out of bed and ambulation.  Medication Inspection Compliance: Pill count conducted under aseptic conditions, in front of the patient. Neither the pills nor the bottle was removed from the patient's sight at any time. Once count was completed pills were immediately returned to the patient in their original bottle.  Medication: See above Pill Count: 4 of 30 pills remain Bottle Appearance: Standard pharmacy container.  Clearly labeled. Filled Date: 51 / 05 / 2017 Medication last intake: 10/04/2016 at 2230    Pharmacokinetics: Liberation and absorption (onset of action): WNL Distribution (time to peak effect): WNL Metabolism and excretion (duration of action): WNL  Pharmacodynamics: Desired effects: Analgesia: Mark Hodges reports >50% benefit. Functional ability: Patient reports that medication allows him to accomplish basic ADLs Clinically meaningful improvement in function (CMIF): Sustained CMIF goals met Perceived effectiveness: Described as relatively effective, allowing for increase in activities of daily living (ADL) Undesirable effects: Side-effects or Adverse reactions: None reported Monitoring: [place] PMP: Online review of the past 33-monthperiod conducted. Compliant with practice rules and regulations List of all UDS test(s) done:  Lab Results  Component Value Date   TOXASSSELUR FINAL 02/08/2016   TOXASSSELUR FINAL 10/28/2015   Last UDS on record: ToxAssure Select 13  Date Value Ref Range Status  02/08/2016 FINAL  Final    Comment:    ==================================================================== TOXASSURE SELECT 13 (MW) ==================================================================== Test                             Result       Flag       Units Drug Present and Declared for Prescription Verification   Oxycodone                      163          EXPECTED   ng/mg creat   Oxymorphone                    1192         EXPECTED   ng/mg creat   Noroxycodone                   573          EXPECTED   ng/mg creat   Noroxymorphone                 487          EXPECTED   ng/mg creat    Sources of oxycodone are scheduled prescription medications.    Oxymorphone, noroxycodone, and noroxymorphone are expected    metabolites of oxycodone. Oxymorphone is also available as a    scheduled prescription  medication. ==================================================================== Test                      Result    Flag   Units      Ref Range   Creatinine              78               mg/dL      >=20 ==================================================================== Declared Medications:  The flagging and interpretation on this report are based on the  following declared medications.  Unexpected results may arise from  inaccuracies in the declared medications.  **Note: The testing scope of this panel includes these medications:  Oxycodone (Oxycodone Acetaminophen)  **Note: The testing scope of this panel does not include following  reported medications:  Acetaminophen (Oxycodone Acetaminophen)  Alprostadil  Aspirin  Atorvastatin  Gabapentin  Nortriptyline  Tamsulosin  Venlafaxine ==================================================================== For clinical consultation, please call [phone] ====================================================================    UDS interpretation: Compliant          Medication Assessment Form: Reviewed. Patient indicates being compliant with therapy Treatment compliance: Compliant Risk Assessment Profile: Aberrant behavior: See prior evaluations. None observed or detected today Comorbid factors increasing risk of overdose: See prior notes. No additional risks detected today Risk of substance use disorder (SUD): Low Opioid Risk Tool (ORT) Total Score:    Interpretation Table:  Score <  3 = Low Risk for SUD  Score between 4-7 = Moderate Risk for SUD  Score >8 = High Risk for Opioid Abuse   Risk Mitigation Strategies:  Patient Counseling: Covered Patient-Prescriber Agreement (PPA): Present and active  Notification to other healthcare providers: Done  Pharmacologic Plan: No change in therapy, at this time  Post-Procedure Assessment  08/10/2016 Procedure: Right-sided lumbar facet radiofrequency ablation under fluoroscopic  guidance and IV sedation Post-procedure pain score: 0/10 (100% relief) Influential Factors: BMI: 22.81 kg/m Intra-procedural challenges: None observed Assessment challenges: None detected         Post-procedural side-effects, adverse reactions, or complications: None reported Reported issues: None  Sedation: Sedation provided. When no sedatives are used, the analgesic levels obtained are directly associated to the effectiveness of the local anesthetics. However, when sedation is provided, the level of analgesia obtained during the initial 1 hour following the intervention, is believed to be the result of a combination of factors. These factors may include, but are not limited to: 1. The effectiveness of the local anesthetics used. 2. The effects of the analgesic(s) and/or anxiolytic(s) used. 3. The degree of discomfort experienced by the patient at the time of the procedure. 4. The patients ability and reliability in recalling and recording the events. 5. The presence and influence of possible secondary gains and/or psychosocial factors. Reported result: Relief experienced during the 1st hour after the procedure: 100 % (Ultra-Short Term Relief) Interpretative annotation: Analgesia during this period is likely to be Local Anesthetic and/or IV Sedative (Analgesic/Anxiolitic) related.          Effects of local anesthetic: The analgesic effects attained during this period are directly associated to the localized infiltration of local anesthetics and therefore cary significant diagnostic value as to the etiological location, or anatomical origin, of the pain. Expected duration of relief is directly dependent on the pharmacodynamics of the local anesthetic used. Long-acting (4-6 hours) anesthetics used.  Reported result: Relief during the next 4 to 6 hour after the procedure: 100 % (Short-Term Relief) Interpretative annotation: Complete relief would suggest area to be the source of the pain.           Long-term benefit: Defined as the period of time past the expected duration of local anesthetics. With the possible exception of prolonged sympathetic blockade from the local anesthetics, benefits during this period are typically attributed to, or associated with, other factors such as analgesic sensory neuropraxia, antiinflammatory effects, or beneficial biochemical changes provided by agents other than the local anesthetics Reported result: Extended relief following procedure: 80 % (for 2 days only) (Long-Term Relief) Interpretative annotation: Good relief. This could suggest inflammation to be a significant component in the etiology to the pain.          Current benefits: Defined as persistent relief that continues at this point in time.   Reported results: Treated area: >50 % In addition, the patient reports improvement in function. The patient also indicates that the leg pain that he was experiencing prior to the radiofrequency is now 100% gone. He continues to have some pain in the lower back and in the area of the right hip, which may be due to an arthropathy. We will investigate this further once we complete the radiofrequency on the left side. Interpretative annotation: Ongoing benefits would suggest effective therapeutic approach  Interpretation: Results would suggest a successful therapeutic intervention.          Laboratory Chemistry  Inflammation Markers Lab Results  Component Value Date  ESRSEDRATE 5 11/19/2015   CRP 1.4 (H) 11/19/2015   Renal Function Lab Results  Component Value Date   BUN 5 (L) 11/19/2015   CREATININE 0.73 11/19/2015   GFRAA >60 11/19/2015   GFRNONAA >60 11/19/2015   Hepatic Function Lab Results  Component Value Date   AST 25 11/19/2015   ALT 24 11/19/2015   ALBUMIN 4.3 11/19/2015   Electrolytes Lab Results  Component Value Date   NA 140 11/19/2015   K 3.9 11/19/2015   CL 104 11/19/2015   CALCIUM 9.7 11/19/2015   MG 2.0 11/19/2015   Pain  Modulating Vitamins No results found for: [place], VD125OH2TOT, EV0350KX3, GH8299BZ1, 25OHVITD1, 25OHVITD2, 25OHVITD3, VITAMINB12 Coagulation Parameters No results found for: INR, LABPROT, APTT, PLT Cardiovascular No results found for: BNP, HGB, HCT Note: Lab results reviewed.  Recent Diagnostic Imaging Review  Dg C-arm 1-60 Min-no Report  Result Date: 08/25/2016 Fluoroscopy was utilized by the requesting physician.  No radiographic interpretation.   Note: Imaging results reviewed.          Meds  The patient has a current medication list which includes the following prescription(s): alprostadil, aspirin, atorvastatin, diazepam, duloxetine, gabapentin, lamotrigine, nortriptyline, nortriptyline, oxycodone-acetaminophen, oxycodone-acetaminophen, oxycodone-acetaminophen, ropinirole, sertraline, and venlafaxine hcl.  Current Outpatient Prescriptions on File Prior to Visit  Medication Sig  . alprostadil (CAVERJECT IMPULSE) 10 MCG injection 10 mcg by Intracavitary route as needed for erectile dysfunction. use no more than 3 times per week  . aspirin 325 MG tablet Take 325 mg by mouth daily.   Marland Kitchen atorvastatin (LIPITOR) 20 MG tablet Take 20 mg by mouth daily at 6 PM.   . diazepam (VALIUM) 10 MG tablet Take 5 mg by mouth at bedtime as needed for anxiety.   . DULoxetine (CYMBALTA) 30 MG capsule 30 mg daily.   Marland Kitchen lamoTRIgine (LAMICTAL) 25 MG tablet Take 25 mg by mouth daily. Take 1 tablet at night for 1 week then increase by 1 tab per week up 6 tablets BID-  Will increase up to 6 pills bis  . nortriptyline (PAMELOR) 10 MG capsule Take 10 mg by mouth 3 (three) times daily.   . nortriptyline (PAMELOR) 50 MG capsule Take 50 mg by mouth 3 (three) times daily.  Marland Kitchen rOPINIRole (REQUIP) 0.5 MG tablet Take 0.5 mg by mouth at bedtime.   . Venlafaxine HCl 225 MG TB24 Take 2 tablets by mouth daily.    No current facility-administered medications on file prior to visit.    ROS  Constitutional: Denies any  fever or chills Gastrointestinal: No reported hemesis, hematochezia, vomiting, or acute GI distress Musculoskeletal: Denies any acute onset joint swelling, redness, loss of ROM, or weakness Neurological: No reported episodes of acute onset apraxia, aphasia, dysarthria, agnosia, amnesia, paralysis, loss of coordination, or loss of consciousness  Allergies  Mr. Sciarra has No Known Allergies.  PFSH  Drug: Mr. G[place]  reports that he does not use drugs. Alcohol:  reports that he does not drink alcohol. Tobacco:  reports that he has quit smoking. His smokeless tobacco use includes Chew. Medical:  has a past medical history of Arthritis; BPH (benign prostatic hyperplasia); Clinical depression (10/23/2014); Depression; Erectile dysfunction; Hyperlipidemia; Hypogonadism in male; Lower urinary tract infection; Premature ejaculation; Prostate cancer ([place]); Stroke [facility]); and TIA (transient ischemic attack). Family: family history includes Heart disease in his father.  Past Surgical History:  Procedure Laterality Date  . COLONOSCOPY WITH PROPOFOL N/A 01/26/2016   Procedure: COLONOSCOPY WITH PROPOFOL;  Surgeon: Lollie Sails, MD;  Location:  [facility];  Service: Endoscopy;  Laterality: N/A;  . ESOPHAGOGASTRODUODENOSCOPY (EGD) WITH PROPOFOL N/A 01/26/2016   Procedure: ESOPHAGOGASTRODUODENOSCOPY (EGD) WITH PROPOFOL;  Surgeon: Lollie Sails, MD;  Location: [facility] ENDOSCOPY;  Service: Endoscopy;  Laterality: N/A;  . TONSILLECTOMY     Constitutional Exam  General appearance: Well nourished, well developed, and well hydrated. In no apparent acute distress Vitals:   10/05/16 1313  BP: 115/75  Pulse: (!) 109  Resp: 20  Temp: 98.5 F (36.9 C)  SpO2: 98%  Weight: 150 lb (68 kg)  Height: 5' 8"  (1.727 m)   BMI Assessment: Estimated body mass index is 22.81 kg/m as calculated from the following:   Height as of this encounter: 5' 8"  (1.727 m).   Weight as of this encounter: 150 lb (68 kg).  BMI  interpretation table: BMI level Category Range association with higher incidence of chronic pain  <18 kg/m2 Underweight   18.5-24.9 kg/m2 Ideal body weight   25-29.9 kg/m2 Overweight Increased incidence by 20%  30-34.9 kg/m2 Obese (Class I) Increased incidence by 68%  35-39.9 kg/m2 Severe obesity (Class II) Increased incidence by 136%  >40 kg/m2 Extreme obesity (Class III) Increased incidence by 254%   BMI Readings from Last 4 Encounters:  10/05/16 22.81 kg/m  08/10/16 22.05 kg/m  06/27/16 24.13 kg/m  04/21/16 20.80 kg/m   Wt Readings from Last 4 Encounters:  10/05/16 150 lb (68 kg)  08/10/16 145 lb (65.8 kg)  06/27/16 145 lb (65.8 kg)  04/21/16 136 lb 12.8 oz (62.1 kg)  Psych/Mental status: Alert, oriented x 3 (person, place, & time) Eyes: PERLA Respiratory: No evidence of acute respiratory distress  Cervical Spine Exam  Inspection: No masses, redness, or swelling Alignment: Symmetrical Functional ROM: Unrestricted ROM Stability: No instability detected Muscle strength & Tone: Functionally intact Sensory: Unimpaired Palpation: Non-contributory  Upper Extremity (UE) Exam    Side: Right upper extremity  Side: Left upper extremity  Inspection: No masses, redness, swelling, or asymmetry  Inspection: No masses, redness, swelling, or asymmetry  Functional ROM: Unrestricted ROM          Functional ROM: Unrestricted ROM          Muscle strength & Tone: Functionally intact  Muscle strength & Tone: Functionally intact  Sensory: Unimpaired  Sensory: Unimpaired  Palpation: Non-contributory  Palpation: Non-contributory   Thoracic Spine Exam  Inspection: No masses, redness, or swelling Alignment: Symmetrical Functional ROM: Unrestricted ROM Stability: No instability detected Sensory: Unimpaired Muscle strength & Tone: Functionally intact Palpation: Non-contributory  Lumbar Spine Exam  Inspection: No masses, redness, or swelling Alignment: Symmetrical Functional ROM:  Improved after treatment Stability: No instability detected Muscle strength & Tone: Functionally intact Sensory: Movement-associated pain Palpation: Complains of area being tender to palpation Provocative Tests: Lumbar Hyperextension and rotation test: Positive on the left for facet joint pain. Patrick's Maneuver: evaluation deferred today              Gait & Posture Assessment  Ambulation: Unassisted Gait: Relatively normal for age and body habitus Posture: WNL   Lower Extremity Exam    Side: Right lower extremity  Side: Left lower extremity  Inspection: No masses, redness, swelling, or asymmetry  Inspection: No masses, redness, swelling, or asymmetry  Functional ROM: Unrestricted ROM          Functional ROM: Unrestricted ROM          Muscle strength & Tone: Functionally intact  Muscle strength & Tone: Functionally intact  Sensory: Unimpaired  Sensory: Unimpaired  Palpation: Non-contributory  Palpation: Non-contributory   Assessment  Primary Diagnosis & Pertinent Problem List: The primary encounter diagnosis was Lumbar facet syndrome (Location of Primary Source of Pain) (Bilateral) (R>L). Diagnoses of Chronic pain syndrome, Chronic low back pain (Location of Primary Source of Pain) (Bilateral) (R>L), 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, by EMG/PNCV, Chronic hip pain (Location of Secondary source of pain) (Right), 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, Neuropathic pain, Long term prescription opiate use, and Opiate use (15 MME/Day) were also pertinent to this visit.  Status Diagnosis  Improved Stable Improved 1. Lumbar facet syndrome (Location of Primary Source of Pain) (Bilateral) (R>L)   2. Chronic pain syndrome   3. Chronic low back pain (Location of Primary Source of Pain) (Bilateral) (R>L)   4. 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, by EMG/PNCV   5. Chronic hip pain (Location of Secondary source of pain) (Right)    6. 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   7. Neuropathic pain   8. Long term prescription opiate use   9. Opiate use (15 MME/Day)      Plan of Care  Pharmacotherapy (Medications Ordered): Meds ordered this encounter  Medications  . gabapentin (NEURONTIN) 300 MG capsule    Sig: Take 3 capsules (900 mg total) by mouth 3 (three) times daily. Follow titration schedule.    Dispense:  810 capsule    Refill:  0    This is a 90 day refill to be mailed out.  Marland Kitchen oxyCODONE-acetaminophen (PERCOCET) 10-325 MG tablet    Sig: Take 1 tablet by mouth daily as needed for pain.    Dispense:  30 tablet    Refill:  0    Do not place this medication, or any other prescription from our practice, on "Automatic Refill". Patient may have prescription filled one day early if pharmacy is closed on scheduled refill date. Do not fill until: 11/15/16 To last until: 12/15/16  . oxyCODONE-acetaminophen (PERCOCET) 10-325 MG tablet    Sig: Take 1 tablet by mouth daily as needed for pain.    Dispense:  30 tablet    Refill:  0    Do not place this medication, or any other prescription from our practice, on "Automatic Refill". Patient may have prescription filled one day early if pharmacy is closed on scheduled refill date. Do not fill until: 10/16/16 To last until: 11/15/16  . oxyCODONE-acetaminophen (PERCOCET) 10-325 MG tablet    Sig: Take 1 tablet by mouth daily as needed for pain.    Dispense:  30 tablet    Refill:  0    Do not place this medication, or any other prescription from our practice, on "Automatic Refill". Patient may have prescription filled one day early if pharmacy is closed on scheduled refill date. Do not fill until: 12/15/16 To last until: 01/14/17   New Prescriptions   No medications on file   Medications administered today: Mr. Betts had no medications administered during this visit. Lab-work, procedure(s), and/or  referral(s): Orders Placed This Encounter  Procedures  . Radiofrequency,Lumbar   Imaging and/or referral(s): None  Interventional therapies: Planned, scheduled, and/or pending:   We will proceed with lumbar facet radiofrequency ablation on the left side. The right side was successfully completed on 08/10/2016.    Considering:   Palliative bilateral lumbar facet radiofrequency ablation under fluoroscopic guidance and IV sedation. (Right side done on 08/10/2016 with  complete resolution of the right lower extremity pain and more than 50% relief of the right sided low back pain.) Diagnostic right intra-articular hip joint injection (first diagnostic completed on 11/13/2015. 75% relief with the local anesthetic.)  Diagnostic bilateral lumbar facet block (first done on 02/16/2016. 100% relief with local anesthetic) (second done on 03/08/2016. 100% relief with local anesthetic)    Palliative PRN treatment(s):   Not at this time.   Provider-requested follow-up: Return in about 3 months (around 01/03/2017) for (NP) Med-Mgmt, in addition, Procedure: Left Lumbar Facet RFA.  Future Appointments Date Time Provider [place]  10/14/2016 9:30 AM [facility] None  10/31/2016 10:45 AM Milinda Pointer, MD [facility] None  11/04/2016 8:45 AM Hollice Espy, MD [facility] None   Primary Care Physician: Tracie Harrier, MD Location: [facility] Note by: Kathlen Brunswick. Dossie Arbour, M.D, DABA, DABAPM, DABPM, DABIPP, FIPP Date: 10/05/16; Time: 6:14 PM  Pain Score Disclaimer: We use the NRS-11 scale. This is a self-reported, subjective measurement of pain severity with only modest accuracy. It is used primarily to identify changes within a particular patient. It must be understood that outpatient pain scales are significantly less accurate that those used for research, where they can be applied under ideal controlled circumstances with minimal exposure to variables. In reality,  the score is likely to be a combination of pain intensity and pain affect, where pain affect describes the degree of emotional arousal or changes in action readiness caused by the sensory experience of pain. Factors such as social and work situation, setting, emotional state, anxiety levels, expectation, and prior pain experience may influence pain perception and show large inter-individual differences that may also be affected by time variables.  Patient instructions provided during this appointment: Patient Instructions  Radiofrequency Lesioning Introduction Radiofrequency lesioning is a procedure that is performed to relieve pain. The procedure is often used for back, neck, or arm pain. Radiofrequency lesioning involves the use of a machine that creates radio waves to make heat. During the procedure, the heat is applied to the nerve that carries the pain signal. The heat damages the nerve and interferes with the pain signal. Pain relief usually starts about 2 weeks after the procedure and lasts for 6 months to 1 year. Tell a health care provider about:  Any allergies you have.  All medicines you are taking, including vitamins, herbs, eye drops, creams, and over-the-counter medicines.  Any problems you or family members have had with anesthetic medicines.  Any blood disorders you have.  Any surgeries you have had.  Any medical conditions you have.  Whether you are pregnant or may be pregnant. What are the risks? Generally, this is a safe procedure. However, problems may occur, including:  Pain or soreness at the injection site.  Infection at the injection site.  Damage to nerves or blood vessels. What happens before the procedure?  Ask your health care provider about:  Changing or stopping your regular medicines. This is especially important if you are taking diabetes medicines or blood thinners.  Taking medicines such as aspirin and ibuprofen. These medicines can thin your  blood. Do not take these medicines before your procedure if your health care provider instructs you not to.  Follow instructions from your health care provider about eating or drinking restrictions.  Plan to have someone take you home after the procedure.  If you go home right after the procedure, plan to have someone with you for 24 hours. What happens during the procedure?  You will be given one or more of the following:  A medicine to help you relax (sedative).  A medicine to numb the area (local anesthetic).  You will be awake during the procedure. You will need to be able to talk with the health care provider during the procedure.  With the help of a type of X-ray (fluoroscopy), the health care provider will insert a radiofrequency needle into the area to be treated.  Next, a wire that carries the radio waves (electrode) will be put through the radiofrequency needle. An electrical pulse will be sent through the electrode to verify the correct nerve. You will feel a tingling sensation, and you may have muscle twitching.  Then, the tissue that is around the needle tip will be heated by an electric current that is passed using the radiofrequency machine. This will numb the nerves.  A bandage (dressing) will be put on the insertion area after the procedure is done. The procedure may vary among health care providers and hospitals. What happens after the procedure?  Your blood pressure, heart rate, breathing rate, and blood oxygen level will be monitored often until the medicines you were given have worn off.  Return to your normal activities as directed by your health care provider. This information is not intended to replace advice given to you by your health care provider. Make sure you discuss any questions you have with your health care provider. Document Released: 05/11/2011 Document Revised: 02/18/2016 Document Reviewed: 10/20/2014 A prescription for Neurontin was sent to your  pharmacy. You were given 3 prescriptions for Percocet  2017 [employer]

## 2016-10-05 NOTE — Progress Notes (Signed)
Nursing Pain Medication Assessment:  Safety precautions to be maintained throughout the outpatient stay will include: orient to surroundings, keep bed in low position, maintain call bell within reach at all times, provide assistance with transfer out of bed and ambulation.  Medication Inspection Compliance: Pill count conducted under aseptic conditions, in front of the patient. Neither the pills nor the bottle was removed from the patient's sight at any time. Once count was completed pills were immediately returned to the patient in their original bottle.  Medication: See above Pill Count: 4 of 30 pills remain Bottle Appearance: Standard pharmacy container. Clearly labeled. Filled Date: 34 / 05 / 2017 Medication last intake: 10/04/2016 at 2230

## 2016-10-05 NOTE — Patient Instructions (Signed)
Radiofrequency Lesioning Introduction Radiofrequency lesioning is a procedure that is performed to relieve pain. The procedure is often used for back, neck, or arm pain. Radiofrequency lesioning involves the use of a machine that creates radio waves to make heat. During the procedure, the heat is applied to the nerve that carries the pain signal. The heat damages the nerve and interferes with the pain signal. Pain relief usually starts about 2 weeks after the procedure and lasts for 6 months to 1 year. Tell a health care provider about:  Any allergies you have.  All medicines you are taking, including vitamins, herbs, eye drops, creams, and over-the-counter medicines.  Any problems you or family members have had with anesthetic medicines.  Any blood disorders you have.  Any surgeries you have had.  Any medical conditions you have.  Whether you are pregnant or may be pregnant. What are the risks? Generally, this is a safe procedure. However, problems may occur, including:  Pain or soreness at the injection site.  Infection at the injection site.  Damage to nerves or blood vessels. What happens before the procedure?  Ask your health care provider about:  Changing or stopping your regular medicines. This is especially important if you are taking diabetes medicines or blood thinners.  Taking medicines such as aspirin and ibuprofen. These medicines can thin your blood. Do not take these medicines before your procedure if your health care provider instructs you not to.  Follow instructions from your health care provider about eating or drinking restrictions.  Plan to have someone take you home after the procedure.  If you go home right after the procedure, plan to have someone with you for 24 hours. What happens during the procedure?  You will be given one or more of the following:  A medicine to help you relax (sedative).  A medicine to numb the area (local anesthetic).  You  will be awake during the procedure. You will need to be able to talk with the health care provider during the procedure.  With the help of a type of X-ray (fluoroscopy), the health care provider will insert a radiofrequency needle into the area to be treated.  Next, a wire that carries the radio waves (electrode) will be put through the radiofrequency needle. An electrical pulse will be sent through the electrode to verify the correct nerve. You will feel a tingling sensation, and you may have muscle twitching.  Then, the tissue that is around the needle tip will be heated by an electric current that is passed using the radiofrequency machine. This will numb the nerves.  A bandage (dressing) will be put on the insertion area after the procedure is done. The procedure may vary among health care providers and hospitals. What happens after the procedure?  Your blood pressure, heart rate, breathing rate, and blood oxygen level will be monitored often until the medicines you were given have worn off.  Return to your normal activities as directed by your health care provider. This information is not intended to replace advice given to you by your health care provider. Make sure you discuss any questions you have with your health care provider. Document Released: 05/11/2011 Document Revised: 02/18/2016 Document Reviewed: 10/20/2014 A prescription for Neurontin was sent to your pharmacy. You were given 3 prescriptions for Percocet  2017 [employer]

## 2016-10-14 ENCOUNTER — Other Ambulatory Visit: Payer: BLUE CROSS/BLUE SHIELD

## 2016-10-20 ENCOUNTER — Other Ambulatory Visit: Payer: Self-pay

## 2016-10-20 DIAGNOSIS — C61 Malignant neoplasm of prostate: Secondary | ICD-10-CM

## 2016-10-21 ENCOUNTER — Other Ambulatory Visit: Payer: Medicare Other

## 2016-10-21 ENCOUNTER — Ambulatory Visit: Payer: BLUE CROSS/BLUE SHIELD | Admitting: Urology

## 2016-10-21 DIAGNOSIS — C61 Malignant neoplasm of prostate: Secondary | ICD-10-CM

## 2016-10-22 LAB — PSA: PROSTATE SPECIFIC AG, SERUM: 8.3 ng/mL — AB (ref 0.0–4.0)

## 2016-10-24 ENCOUNTER — Encounter: Payer: Self-pay | Admitting: Urology

## 2016-10-24 ENCOUNTER — Ambulatory Visit (INDEPENDENT_AMBULATORY_CARE_PROVIDER_SITE_OTHER): Payer: Medicare Other | Admitting: Urology

## 2016-10-24 VITALS — BP 156/86 | HR 113 | Ht 68.0 in | Wt 139.0 lb

## 2016-10-24 DIAGNOSIS — R350 Frequency of micturition: Secondary | ICD-10-CM

## 2016-10-24 DIAGNOSIS — C61 Malignant neoplasm of prostate: Secondary | ICD-10-CM | POA: Diagnosis not present

## 2016-10-24 DIAGNOSIS — N528 Other male erectile dysfunction: Secondary | ICD-10-CM | POA: Diagnosis not present

## 2016-10-24 MED ORDER — ALPROSTADIL (VASODILATOR) 10 MCG IC KIT: 10.0000 ug | PACK | INTRACAVERNOUS | 12 refills | Status: DC | PRN

## 2016-10-24 MED ORDER — TAMSULOSIN HCL 0.4 MG PO CAPS: 0.4000 mg | ORAL_CAPSULE | Freq: Every day | ORAL | 3 refills | Status: DC

## 2016-10-24 NOTE — Progress Notes (Signed)
11:10 AM  10/24/16   Mark Hodges May 16, 1956 [id number]  Referring provider: Tracie Harrier, MD [address] [facility] [place], [place] [address]  Chief Complaint  Patient presents with  . Prostate Cancer    63month w/PSA &DRE    HPI:  Prostate cancer  61 yo M with diagnosed in 01/2014 with T1c Gleason 3+3 prostate cancer in 2/12 cores, 1-3% and single core HGPIN dx 01/2014. PSA at time of dx 5.41. TRUS vol 34 cc. He has been on active surveillance.    Repeat biopsy on 02/20/15 per active surveillance protocol which showed only a small focus of atypical glands, suspicious for carcinoma   TRUS vol 33 cc.     His PSA has been relatively stable in the 5-7 range.  Most recent PSA slightly up to 8.3 from 6.8  He did have in interval PSA by his PCP on 08/29/16 which was 10.54 with [employer] Hybritech Assay.    He returns today for PSA/ DRE.  LUTS History of LUTS, urinary frequency.   He does drink tea on a regular basis throughout the day.  PVR minimal on previous visits.    Doing much better back on flomax, taking daily.  No complaints today.    ED Baseline ED, failed multiple PDE5i. No sucess with VED.  Currently good success with caverject 10 mcg.  Working very well.  Using about 1/ month.  No penile scarring of pain.  No curvature.   PHx chronic back pain managed by pain clinic.     PMH: Past Medical History:  Diagnosis Date  . Arthritis   . BPH (benign prostatic hyperplasia)   . Clinical depression 10/23/2014  . Depression   . Erectile dysfunction   . Hyperlipidemia   . Hypogonadism in male   . Lower urinary tract infection   . Premature ejaculation   . Prostate cancer ([place])   . Stroke ([place])   . TIA (transient ischemic attack)     Surgical History: Past Surgical History:  Procedure Laterality Date  . COLONOSCOPY WITH PROPOFOL N/A 01/26/2016   Procedure: COLONOSCOPY WITH PROPOFOL;  Surgeon: Lollie Sails, MD;  Location:  [facility] ENDOSCOPY;  Service: Endoscopy;  Laterality: N/A;  . ESOPHAGOGASTRODUODENOSCOPY (EGD) WITH PROPOFOL N/A 01/26/2016   Procedure: ESOPHAGOGASTRODUODENOSCOPY (EGD) WITH PROPOFOL;  Surgeon: Lollie Sails, MD;  Location: [facility] ENDOSCOPY;  Service: Endoscopy;  Laterality: N/A;  . TONSILLECTOMY      Home Medications:  Allergies as of 10/24/2016   No Known Allergies     Medication List       Accurate as of 10/24/16 11:10 AM. Always use your most recent med list.          alprostadil 10 MCG injection Commonly known as:  CAVERJECT IMPULSE 10 mcg by Intracavitary route as needed for erectile dysfunction. use no more than 3 times per week   aspirin 325 MG tablet Take 325 mg by mouth daily.   atorvastatin 20 MG tablet Commonly known as:  LIPITOR Take 20 mg by mouth daily at 6 PM.   diazepam 10 MG tablet Commonly known as:  VALIUM Take 5 mg by mouth at bedtime as needed for anxiety.   DULoxetine 30 MG capsule Commonly known as:  CYMBALTA 30 mg daily.   gabapentin 300 MG capsule Commonly known as:  NEURONTIN Take 3 capsules (900 mg total) by mouth 3 (three) times daily. Follow titration schedule.   lamoTRIgine 25 MG tablet Commonly known as:  LAMICTAL Take 25 mg by mouth daily. Take 1 tablet at night for 1 week then increase by 1 tab per week up 6 tablets BID-  Will increase up to 6 pills bis   nortriptyline 10 MG capsule Commonly known as:  PAMELOR Take 10 mg by mouth 3 (three) times daily.   nortriptyline 50 MG capsule Commonly known as:  PAMELOR Take 50 mg by mouth 3 (three) times daily.   oxyCODONE-acetaminophen 10-325 MG tablet Commonly known as:  PERCOCET Take 1 tablet by mouth daily as needed for pain. Start taking on:  11/15/2016   rOPINIRole 0.5 MG tablet Commonly known as:  REQUIP Take 0.5 mg by mouth at bedtime.   sertraline 50 MG tablet Commonly known as:  ZOLOFT Take 50 mg by mouth daily.   tamsulosin 0.4 MG Caps capsule Commonly known as:   FLOMAX Take 1 capsule (0.4 mg total) by mouth daily.   Venlafaxine HCl 225 MG Tb24 Take 2 tablets by mouth daily.       Allergies: No Known Allergies  Family History: Family History  Problem Relation Age of Onset  . Heart disease Father   . Prostate cancer Neg Hx   . Bladder Cancer Neg Hx     Social History:  reports that he has quit smoking. His smokeless tobacco use includes Chew. He reports that he does not drink alcohol or use drugs.   ROS: UROLOGY Frequent Urination?: Yes Hard to postpone urination?: No Burning/pain with urination?: No Get up at night to urinate?: Yes Leakage of urine?: No Urine stream starts and stops?: Yes Trouble starting stream?: Yes Do you have to strain to urinate?: No Blood in urine?: No Urinary tract infection?: No Sexually transmitted disease?: No Injury to kidneys or bladder?: No Painful intercourse?: No Weak stream?: No Erection problems?: Yes Penile pain?: No Gastrointestinal Nausea?: No Vomiting?: No Indigestion/heartburn?: Yes Diarrhea?: No Constipation?: No Constitutional Fever: No Night sweats?: No Weight loss?: Yes Fatigue?: Yes Skin Skin rash/lesions?: No Itching?: No Eyes Blurred vision?: No Double vision?: No Ears/Nose/Throat Sore throat?: No Sinus problems?: Yes Hematologic/Lymphatic Swollen glands?: No Easy bruising?: No Cardiovascular Leg swelling?: No Chest pain?: No Respiratory Cough?: No Shortness of breath?: Yes Endocrine Excessive thirst?: No Musculoskeletal Back pain?: Yes Joint pain?: Yes Neurological Headaches?: Yes Dizziness?: No Psychologic Depression?: Yes Anxiety?: Yes   Physical Exam: BP (!) 156/86   Pulse (!) 113   Ht 5\' 8"  (1.727 m)   Wt 139 lb (63 kg)   BMI 21.13 kg/m   Constitutional:  Alert and oriented, No acute distress. Ambulating with walker.  Presents with wife today HEENT: [place] AT, moist mucus membranes.  Trachea midline, no masses. Cardiovascular: No clubbing,  cyanosis, or edema. Respiratory: Normal respiratory effort, no increased work of breathing. GI: Abdomen is soft, nontender, nondistended, no abdominal masses GU: No CVA tenderness.  40g gland, no nodules.  Normal external sphincter.   Phallus circumcised with orthotopic meatus, no corporal fibrosis noted.  Bilateral descended testicles.   Skin: No rashes, bruises or suspicious lesions. Neurologic: Grossly intact, no focal deficits, moving all 4 extremities. Psychiatric: Normal mood and affect.   Labs: Component     Latest Ref Rng & Units 08/06/2015 12/04/2015 04/21/2016 10/21/2016  PSA     0.0 - 4.0 ng/mL 6.8 (H) 7.5 (H) 6.8 (H) 8.3 (H)     Assessment & Plan:   1. Prostate cancer  Diagnosed in 01/2014 with T1c Gleason 3+3 prostate cancer in 2/12 cores, 1-3% and single core  HGPIN dx 01/2014. PSA at time of dx 5.41. Repeat bx 01/2015,  Single focus of atypical cells but no evidence of clinically signficant cancer despite rising PSA to 7.0.   PSA slightly up today.  Rectal exam unchanged.    Recommend continuation of active surveillance, will increase to 4 month frequency given slight rise.   No indication for repeat biopsy at time, will follow closely.   Patient is aggregate with this plan  2. Other male erectile dysfunction Continue Caverject 10 mcg - refilled today Discussed alternating sides of injection  3. LUTS/ urinary frequency Behavioral modification previously discussed Continue flomax Symptoms currently well controlled   Return in about 4 months (around 02/21/2017) for PSA/ DRE.  Hollice Espy, MD  [facility] [address], [place], [place] [address] [phone]

## 2016-10-25 ENCOUNTER — Other Ambulatory Visit: Payer: Self-pay

## 2016-10-25 DIAGNOSIS — N529 Male erectile dysfunction, unspecified: Secondary | ICD-10-CM

## 2016-10-25 MED ORDER — ALPROSTADIL (VASODILATOR) 10 MCG IC KIT
10.0000 ug | PACK | INTRACAVERNOUS | 3 refills | Status: AC | PRN
Start: 2016-10-25 — End: ?

## 2016-10-31 ENCOUNTER — Ambulatory Visit: Payer: BLUE CROSS/BLUE SHIELD | Admitting: Pain Medicine

## 2016-11-01 ENCOUNTER — Other Ambulatory Visit: Payer: BLUE CROSS/BLUE SHIELD

## 2016-11-04 ENCOUNTER — Ambulatory Visit: Payer: BLUE CROSS/BLUE SHIELD | Admitting: Urology

## 2016-11-22 ENCOUNTER — Telehealth: Payer: Self-pay

## 2016-11-22 NOTE — Telephone Encounter (Signed)
Left voicemail with patient that increasing his medication dosage can not be handled over the phone and that he will need to wait until his appt time.  Also, stated that he could call front desk to see if there is an earlier appt time if he feels he would like to be sooner re; this issue.

## 2016-11-22 NOTE — Telephone Encounter (Signed)
Pt says he would like to speak to someone about increasing his medication dosage. Please call pt asap and if he does not answer please lvm

## 2016-12-19 ENCOUNTER — Ambulatory Visit
Admission: RE | Admit: 2016-12-19 | Discharge: 2016-12-19 | Disposition: A | Discharge status: Home / Self Care | Payer: Medicare Other | Source: Ambulatory Visit | Attending: Pain Medicine | Admitting: Pain Medicine

## 2016-12-19 ENCOUNTER — Ambulatory Visit (HOSPITAL_BASED_OUTPATIENT_CLINIC_OR_DEPARTMENT_OTHER): Payer: Medicare Other | Admitting: Pain Medicine

## 2016-12-19 ENCOUNTER — Encounter: Payer: Self-pay | Admitting: Pain Medicine

## 2016-12-19 VITALS — BP 148/86 | HR 88 | Resp 18 | Ht 68.0 in | Wt 150.0 lb

## 2016-12-19 DIAGNOSIS — M5386 Other specified dorsopathies, lumbar region: Secondary | ICD-10-CM

## 2016-12-19 DIAGNOSIS — G8929 Other chronic pain: Secondary | ICD-10-CM

## 2016-12-19 DIAGNOSIS — M47816 Spondylosis without myelopathy or radiculopathy, lumbar region: Secondary | ICD-10-CM | POA: Insufficient documentation

## 2016-12-19 DIAGNOSIS — M47817 Spondylosis without myelopathy or radiculopathy, lumbosacral region: Secondary | ICD-10-CM

## 2016-12-19 DIAGNOSIS — M1288 Other specific arthropathies, not elsewhere classified, other specified site: Secondary | ICD-10-CM

## 2016-12-19 DIAGNOSIS — M545 Low back pain: Secondary | ICD-10-CM | POA: Diagnosis not present

## 2016-12-19 DIAGNOSIS — G894 Chronic pain syndrome: Secondary | ICD-10-CM | POA: Diagnosis not present

## 2016-12-19 MED ORDER — LACTATED RINGERS IV SOLN: 1000.0000 mL | Freq: Once | INTRAVENOUS | Status: DC

## 2016-12-19 MED ORDER — ROPIVACAINE HCL 5 MG/ML IJ SOLN
INTRAMUSCULAR | Status: AC
  Filled 2016-12-19: qty 20

## 2016-12-19 MED ORDER — ROPIVACAINE HCL 5 MG/ML IJ SOLN
5.0000 mL | Freq: Once | INTRAMUSCULAR | Status: AC
Start: 2016-12-19 — End: 2016-12-19
  Administered 2016-12-19: 20 mL via PERINEURAL

## 2016-12-19 MED ORDER — OXYCODONE HCL 10 MG PO TABS: 10.0000 mg | ORAL_TABLET | Freq: Three times a day (TID) | ORAL | 0 refills | Status: DC | PRN

## 2016-12-19 MED ORDER — TRIAMCINOLONE ACETONIDE 40 MG/ML IJ SUSP
40.0000 mg | Freq: Once | INTRAMUSCULAR | Status: AC
  Administered 2016-12-19: 40 mg

## 2016-12-19 MED ORDER — MIDAZOLAM HCL 5 MG/5ML IJ SOLN
1.0000 mg | INTRAMUSCULAR | Status: DC | PRN
  Administered 2016-12-19: 5 mg via INTRAVENOUS

## 2016-12-19 MED ORDER — FENTANYL CITRATE (PF) 100 MCG/2ML IJ SOLN
25.0000 ug | INTRAMUSCULAR | Status: DC | PRN
  Administered 2016-12-19: 100 ug via INTRAVENOUS

## 2016-12-19 MED ORDER — LIDOCAINE HCL (PF) 1 % IJ SOLN: 10.0000 mL | Freq: Once | INTRAMUSCULAR | Status: DC

## 2016-12-19 MED ORDER — TRIAMCINOLONE ACETONIDE 40 MG/ML IJ SUSP
INTRAMUSCULAR | Status: AC
  Filled 2016-12-19: qty 1

## 2016-12-19 MED ORDER — FENTANYL CITRATE (PF) 100 MCG/2ML IJ SOLN
INTRAMUSCULAR | Status: AC
  Filled 2016-12-19: qty 2

## 2016-12-19 MED ORDER — MIDAZOLAM HCL 5 MG/5ML IJ SOLN
INTRAMUSCULAR | Status: AC
  Filled 2016-12-19: qty 5

## 2016-12-19 NOTE — Progress Notes (Signed)
Patient's Name: Mark Hodges  MRN: [id number]  Referring Provider: Tracie Harrier, MD  DOB: December 18, 1955  PCP: Tracie Harrier, MD  DOS: 12/19/2016  Note by: Kathlen Brunswick. Dossie Arbour, MD  Service setting: Ambulatory outpatient  Location: [facility] (AMB) Pain Management Facility  Visit type: Procedure  Specialty: Interventional Pain Management  Patient type: Established   Primary Reason for Visit: Interventional Pain Management Treatment. CC: Back Pain (bilateral low and mid)  Procedure:  Anesthesia, Analgesia, Anxiolysis:  Type: Therapeutic Medial Branch Facet Radiofrequency Ablation Region: Lumbar Level: L2, L3, L4, L5, & S1 Medial Branch Level(s) Laterality: Left-Sided  Type: Local Anesthesia with Moderate (Conscious) Sedation Local Anesthetic: Lidocaine 1% Route: Intravenous (IV) IV Access: Secured Sedation: Meaningful verbal contact was maintained at all times during the procedure  Indication(s): Analgesia and Anxiety  Indications: 1. Lumbar facet syndrome (Location of Primary Source of Pain) (Bilateral) (R>L)   2. Chronic pain syndrome   3. Chronic low back pain (Location of Primary Source of Pain) (Bilateral) (R>L)   4. Lumbar facet arthropathy   5. Lumbar spondylosis    Mr. Osborn has either failed to respond, was unable to tolerate, or simply did not get enough benefit from other more conservative therapies including, but not limited to: 1. Over-the-counter medications 2. Anti-inflammatory medications 3. Muscle relaxants 4. Membrane stabilizers 5. Opioids 6. Physical therapy 7. Modalities (Heat, ice, etc.) 8. Invasive techniques such as nerve blocks. Mr. Mottola has attained more than 50% relief of the pain from a series of diagnostic injections conducted in separate occasions.  Pain Score: Pre-procedure: 8 /10 Post-procedure: 0-No pain/10  Pre-op Assessment:  Previous date of service: 10/05/16 Service provided: Med Refill Mr. Trudell is a 61 y.o. (year old), male patient,  seen today for interventional treatment. He  has a past surgical history that includes Tonsillectomy; Colonoscopy with propofol (N/A, 01/26/2016); and Esophagogastroduodenoscopy (egd) with propofol (N/A, 01/26/2016). His primarily concern today is the Back Pain (bilateral low and mid)  Initial Vital Signs: Height 5\' 8"  (1.727 m), weight 150 lb (68 kg). BMI: 22.81 kg/m  Risk Assessment: Allergies: Reviewed. He has No Known Allergies.  Allergy Precautions: None required Coagulopathies: "Reviewed. None identified.  Blood-thinner therapy: None at this time Active Infection(s): Reviewed. None identified. Mr. Jury is afebrile  Site Confirmation: Mr. Meeker was asked to confirm the procedure and laterality before marking the site Procedure checklist: Completed Consent: Before the procedure and under the influence of no sedative(s), amnesic(s), or anxiolytics, the patient was informed of the treatment options, risks and possible complications. To fulfill our ethical and legal obligations, as recommended by the American Medical Association's Code of Ethics, I have informed the patient of my clinical impression; the nature and purpose of the treatment or procedure; the risks, benefits, and possible complications of the intervention; the alternatives, including doing nothing; the risk(s) and benefit(s) of the alternative treatment(s) or procedure(s); and the risk(s) and benefit(s) of doing nothing. The patient was provided information about the general risks and possible complications associated with the procedure. These may include, but are not limited to: failure to achieve desired goals, infection, bleeding, organ or nerve damage, allergic reactions, paralysis, and death. In addition, the patient was informed of those risks and complications associated to Spine-related procedures, such as failure to decrease pain; infection (i.e.: Meningitis, epidural or intraspinal abscess); bleeding (i.e.: epidural  hematoma, subarachnoid hemorrhage, or any other type of intraspinal or peri-dural bleeding); organ or nerve damage (i.e.: Any type of peripheral nerve, nerve root, or spinal cord  injury) with subsequent damage to sensory, motor, and/or autonomic systems, resulting in permanent pain, numbness, and/or weakness of one or several areas of the body; allergic reactions; (i.e.: anaphylactic reaction); and/or death. Furthermore, the patient was informed of those risks and complications associated with the medications. These include, but are not limited to: allergic reactions (i.e.: anaphylactic or anaphylactoid reaction(s)); adrenal axis suppression; blood sugar elevation that in diabetics may result in ketoacidosis or comma; water retention that in patients with history of congestive heart failure may result in shortness of breath, pulmonary edema, and decompensation with resultant heart failure; weight gain; swelling or edema; medication-induced neural toxicity; particulate matter embolism and blood vessel occlusion with resultant organ, and/or nervous system infarction; and/or aseptic necrosis of one or more joints. Finally, the patient was informed that Medicine is not an exact science; therefore, there is also the possibility of unforeseen or unpredictable risks and/or possible complications that may result in a catastrophic outcome. The patient indicated having understood very clearly. We have given the patient no guarantees and we have made no promises. Enough time was given to the patient to ask questions, all of which were answered to the patient's satisfaction. Mr. Haynesworth has indicated that he wanted to continue with the procedure. Attestation: I, the ordering provider, attest that I have discussed with the patient the benefits, risks, side-effects, alternatives, likelihood of achieving goals, and potential problems during recovery for the procedure that I have provided informed consent. Date: 12/19/2016; Time:  10:41 AM  Pre-Procedure Preparation:  Monitoring: As per clinic protocol. Respiration, ETCO2, SpO2, BP, heart rate and rhythm monitor placed and checked for adequate function Safety Precautions: Patient was assessed for positional comfort and pressure points before starting the procedure. Time-out: I initiated and conducted the "Time-out" before starting the procedure, as per protocol. The patient was asked to participate by confirming the accuracy of the "Time Out" information. Verification of the correct person, site, and procedure were performed and confirmed by me, the nursing staff, and the patient. "Time-out" conducted as per Joint Commission's Universal Protocol (UP.01.01.01). "Time-out" Date & Time: 12/19/2016; 1108 hrs.  Description of Procedure Process:   Position: Prone Target Area: For Lumbar Facet blocks, the target is the groove formed by the junction of the transverse process and superior articular process. For the L5 dorsal ramus, the target is the notch between superior articular process and sacral ala. For the S1 dorsal ramus, the target is the superior and lateral edge of the posterior S1 Sacral foramen. Approach: Paraspinal approach. Area Prepped: Entire Posterior Lumbosacral Region Prepping solution: Hibiclens (4.0% Chlorhexidine gluconate solution) Safety Precautions: Aspiration looking for blood return was conducted prior to all injections. At no point did we inject any substances, as a needle was being advanced. No attempts were made at seeking any paresthesias. Safe injection practices and needle disposal techniques used. Medications properly checked for expiration dates. SDV (single dose vial) medications used. Description of the Procedure: Protocol guidelines were followed. The patient was placed in position over the fluoroscopy table. The target area was identified and the area prepped in the usual manner. Skin desensitized using vapocoolant spray. Skin & deeper tissues  infiltrated with local anesthetic. Appropriate amount of time allowed to pass for local anesthetics to take effect. Radiofrequency needles were introduced to the area of the medial branch at the junction of the superior articular process and transverse process using fluoroscopy. Using the [employer], sensory stimulation using 50 Hz was used to locate & identify the nerve, making  sure that the needle was positioned such that there was no sensory stimulation below 0.3 V or above 0.7 V. Stimulation using 2 Hz was used to evaluate the motor component. Care was taken not to lesion any nerves that demonstrated motor stimulation of the lower extremities at an output of less than 2.5 times that of the sensory threshold, or a maximum of 2.0 V. Once satisfactory placement of the needles was achieved, the above solution was slowly injected after negative aspiration. After waiting for at least 2 minutes, the ablation was performed at 80 degrees C for 60 seconds.The needles were then removed and the area cleansed, making sure to leave some of the prepping solution back to take advantage of its long term bactericidal properties. Vitals:   12/19/16 1133 12/19/16 1142 12/19/16 1152 12/19/16 1201  BP: 107/69 124/77 134/81 (!) 148/86  Pulse: 93 88 90 88  Resp: 15 12 20 18   SpO2: 99% 99% 100% 99%  Weight:      Height:        Start Time: 1108 hrs. End Time: 1132 hrs. Materials & Medications:  Needle(s) Type: Teflon-coated, curved tip, Radiofrequency needle(s) Gauge: 22G Length: 10cm Medication(s): We administered fentaNYL, midazolam, triamcinolone acetonide, and ropivacaine (PF) 5 mg/mL (0.5%). Please see chart orders for dosing details.  Imaging Guidance (Spinal):  Type of Imaging Technique: Fluoroscopy Guidance (Spinal) Indication(s): Assistance in needle guidance and placement for procedures requiring needle placement in or near specific anatomical locations not easily accessible without  such assistance. Exposure Time: Please see nurses notes. Contrast: None used. Fluoroscopic Guidance: I was personally present during the use of fluoroscopy. "Tunnel Vision Technique" used to obtain the best possible view of the target area. Parallax error corrected before commencing the procedure. "Direction-depth-direction" technique used to introduce the needle under continuous pulsed fluoroscopy. Once target was reached, antero-posterior, oblique, and lateral fluoroscopic projection used confirm needle placement in all planes. Images permanently stored in EMR. Interpretation: No contrast injected. I personally interpreted the imaging intraoperatively. Adequate needle placement confirmed in multiple planes. Permanent images saved into the patient's record.  Antibiotic Prophylaxis:  Indication(s): None identified Antibiotic given: None  Post-operative Assessment:  EBL: None Complications: No immediate post-treatment complications observed by team, or reported by patient. Note: The patient tolerated the entire procedure well. A repeat set of vitals were taken after the procedure and the patient was kept under observation following institutional policy, for this type of procedure. Post-procedural neurological assessment was performed, showing return to baseline, prior to discharge. The patient was provided with post-procedure discharge instructions, including a section on how to identify potential problems. Should any problems arise concerning this procedure, the patient was given instructions to immediately contact us, at any time, without hesitation. In any case, we plan to contact the patient by telephone for a follow-up status report regarding this interventional procedure. Comments:  No additional relevant information.  Plan of Care  Disposition: Discharge home  Discharge Date & Time: 12/19/2016; 1202 hrs.  Physician-requested Follow-up:  Return in about 6 weeks (around 01/30/2017) for  Post-Procedure evaluation, (MD) Med-Mgmt.  Future Appointments Date Time Provider [place]  01/30/2017 2:00 PM Milinda Pointer, MD [facility] None  02/22/2017 3:30 PM Hollice Espy, MD [facility] None   Medications ordered for procedure: Meds ordered this encounter  Medications  . Oxycodone HCl 10 MG TABS    Sig: Take 1 tablet (10 mg total) by mouth every 8 (eight) hours as needed.    Dispense:  90 tablet  Refill:  0    Do not place this medication, or any other prescription from our practice, on "Automatic Refill". Patient may have prescription filled one day early if pharmacy is closed on scheduled refill date. Do not fill until: 12/19/16 To last until: 01/18/17  . Oxycodone HCl 10 MG TABS    Sig: Take 1 tablet (10 mg total) by mouth every 8 (eight) hours as needed.    Dispense:  90 tablet    Refill:  0    Do not place this medication, or any other prescription from our practice, on "Automatic Refill". Patient may have prescription filled one day early if pharmacy is closed on scheduled refill date. Do not fill until: 01/18/17 To last until: 02/17/17  . fentaNYL (SUBLIMAZE) injection 25-50 mcg    Make sure Narcan is available in the pyxis when using this medication. In the event of respiratory depression (RR< 8/min): Titrate NARCAN (naloxone) in increments of 0.1 to 0.2 mg IV at 2-3 minute intervals, until desired degree of reversal.  . lactated ringers infusion 1,000 mL  . midazolam (VERSED) 5 MG/5ML injection 1-2 mg    Make sure Flumazenil is available in the pyxis when using this medication. If oversedation occurs, administer 0.2 mg IV over 15 sec. If after 45 sec no response, administer 0.2 mg again over 1 min; may repeat at 1 min intervals; not to exceed 4 doses (1 mg)  . triamcinolone acetonide (KENALOG-40) injection 40 mg  . lidocaine (PF) (XYLOCAINE) 1 % injection 10 mL  . ropivacaine (PF) 5 mg/mL (0.5%) (NAROPIN) injection 5 mL    Preservative-free (MPF), single use  vial.   Medications administered: We administered fentaNYL, midazolam, triamcinolone acetonide, and ropivacaine (PF) 5 mg/mL (0.5%).  See the medical record for exact dosing, route, and time of administration.  Lab-work, Procedure(s), & Referral(s) Ordered: Orders Placed This Encounter  Procedures  . DG C-Arm 1-60 Min-No Report  . Discharge instructions  . Follow-up  . Informed Consent Details: Transcribe to consent form and obtain patient signature  . Provider attestation of informed consent for procedure/surgical case  . Verify informed consent   Imaging Ordered: Results for orders placed in visit on 08/10/16  DG C-Arm 1-60 Min-No Report   Narrative Fluoroscopy was utilized by the requesting physician.  No radiographic  interpretation.    New Prescriptions   OXYCODONE HCL 10 MG TABS    Take 1 tablet (10 mg total) by mouth every 8 (eight) hours as needed.   OXYCODONE HCL 10 MG TABS    Take 1 tablet (10 mg total) by mouth every 8 (eight) hours as needed.   Primary Care Physician: Tracie Harrier, MD Location: [facility] Note by: Kathlen Brunswick. Dossie Arbour, M.D, DABA, DABAPM, DABPM, DABIPP, FIPP Date: 12/19/2016; Time: 12:22 PM  Disclaimer:  Medicine is not an Chief Strategy Officer. The only guarantee in medicine is that nothing is guaranteed. It is important to note that the decision to proceed with this intervention was based on the information collected from the patient. The Data and conclusions were drawn from the patient's questionnaire, the interview, and the physical examination. Because the information was provided in large part by the patient, it cannot be guaranteed that it has not been purposely or unconsciously manipulated. Every effort has been made to obtain as much relevant data as possible for this evaluation. It is important to note that the conclusions that lead to this procedure are derived in large part from the available data.  Always take into  account that the treatment will also be dependent on availability of resources and existing treatment guidelines, considered by other Pain Management Practitioners as being common knowledge and practice, at the time of the intervention. For Medico-Legal purposes, it is also important to point out that variation in procedural techniques and pharmacological choices are the acceptable norm. The indications, contraindications, technique, and results of the above procedure should only be interpreted and judged by a Board-Certified Interventional Pain Specialist with extensive familiarity and expertise in the same exact procedure and technique. Attempts at providing opinions without similar or greater experience and expertise than that of the treating physician will be considered as inappropriate and unethical, and shall result in a formal complaint to the state medical board and applicable specialty societies.  Instructions provided at this appointment: Patient Instructions  Post-Procedure instructions Instructions:  Apply ice: Fill a plastic sandwich bag with crushed ice. Cover it with a small towel and apply to injection site. Apply for 15 minutes then remove x 15 minutes. Repeat sequence on day of procedure, until you go to bed. The purpose is to minimize swelling and discomfort after procedure.  Apply heat: Apply heat to procedure site starting the day following the procedure. The purpose is to treat any soreness and discomfort from the procedure.  Food intake: Start with clear liquids (like water) and advance to regular food, as tolerated.   Physical activities: Keep activities to a minimum for the first 8 hours after the procedure.   Driving: If you have received any sedation, you are not allowed to drive for 24 hours after your procedure.  Blood thinner: Restart your blood thinner 6 hours after your procedure. (Only for those taking blood thinners)  Insulin: As soon as you can eat, you may  resume your normal dosing schedule. (Only for those taking insulin)  Infection prevention: Keep procedure site clean and dry.  Post-procedure Pain Diary: Extremely important that this be done correctly and accurately. Recorded information will be used to determine the next step in treatment.  Pain evaluated is that of treated area only. Do not include pain from an untreated area.  Complete every hour, on the hour, for the initial 8 hours. Set an alarm to help you do this part accurately.  Do not go to sleep and have it completed later. It will not be accurate.  Follow-up appointment: Keep your follow-up appointment after the procedure. Usually 2 weeks for most procedures. (6 weeks in the case of radiofrequency.) Bring you pain diary.  Expect:  From numbing medicine (AKA: Local Anesthetics): Numbness or decrease in pain.  Onset: Full effect within 15 minutes of injected.  Duration: It will depend on the type of local anesthetic used. On the average, 1 to 8 hours.   From steroids: Decrease in swelling or inflammation. Once inflammation is improved, relief of the pain will follow.  Onset of benefits: Depends on the amount of swelling present. The more swelling, the longer it will take for the benefits to be seen.   Duration: Steroids will stay in the system x 2 weeks. Duration of benefits will depend on multiple posibilities including persistent irritating factors.  From procedure: Some discomfort is to be expected once the numbing medicine wears off. This should be minimal if ice and heat are applied as instructed. Call if:  You experience numbness and weakness that gets worse with time, as opposed to wearing off.  New onset bowel or bladder incontinence. (Spinal procedures only)  Emergency  Numbers:  [facility] business hours (Monday - Thursday, 8:00 AM - 4:00 PM) (Friday, 9:00 AM - 12:00 Noon): [phone]  After hours: [phone]  [phone]   __________________________________________________________________________________________   Radiofrequency Lesioning Radiofrequency lesioning is a procedure that is performed to relieve pain. The procedure is often used for back, neck, or arm pain. Radiofrequency lesioning involves the use of a machine that creates radio waves to make heat. During the procedure, the heat is applied to the nerve that carries the pain signal. The heat damages the nerve and interferes with the pain signal. Pain relief usually starts about 2 weeks after the procedure and lasts for 6 months to 1 year. Tell a health care provider about: Any allergies you have. All medicines you are taking, including vitamins, herbs, eye drops, creams, and over-the-counter medicines. Any problems you or family members have had with anesthetic medicines. Any blood disorders you have. Any surgeries you have had. Any medical conditions you have. Whether you are pregnant or may be pregnant. What are the risks? Generally, this is a safe procedure. However, problems may occur, including: Pain or soreness at the injection site. Infection at the injection site. Damage to nerves or blood vessels. What happens before the procedure? Ask your health care provider about: Changing or stopping your regular medicines. This is especially important if you are taking diabetes medicines or blood thinners. Taking medicines such as aspirin and ibuprofen. These medicines can thin your blood. Do not take these medicines before your procedure if your health care provider instructs you not to. Follow instructions from your health care provider about eating or drinking restrictions. Plan to have someone take you home after the procedure. If you go home right after the procedure, plan to have someone with you for 24 hours. What happens during the procedure? You will be given one or more of the following: A medicine to help you relax  (sedative). A medicine to numb the area (local anesthetic). You will be awake during the procedure. You will need to be able to talk with the health care provider during the procedure. With the help of a type of X-ray (fluoroscopy), the health care provider will insert a radiofrequency needle into the area to be treated. Next, a wire that carries the radio waves (electrode) will be put through the radiofrequency needle. An electrical pulse will be sent through the electrode to verify the correct nerve. You will feel a tingling sensation, and you may have muscle twitching. Then, the tissue that is around the needle tip will be heated by an electric current that is passed using the radiofrequency machine. This will numb the nerves. A bandage (dressing) will be put on the insertion area after the procedure is done. The procedure may vary among health care providers and hospitals. What happens after the procedure? Your blood pressure, heart rate, breathing rate, and blood oxygen level will be monitored often until the medicines you were given have worn off. Return to your normal activities as directed by your health care provider. This information is not intended to replace advice given to you by your health care provider. Make sure you discuss any questions you have with your health care provider. Document Released: 05/11/2011 Document Revised: 02/18/2016 Document Reviewed: 10/20/2014 Elsevier Interactive Patient Education  2017 [employer].

## 2016-12-19 NOTE — Patient Instructions (Addendum)
Post-Procedure instructions Instructions:  Apply ice: Fill a plastic sandwich bag with crushed ice. Cover it with a small towel and apply to injection site. Apply for 15 minutes then remove x 15 minutes. Repeat sequence on day of procedure, until you go to bed. The purpose is to minimize swelling and discomfort after procedure.  Apply heat: Apply heat to procedure site starting the day following the procedure. The purpose is to treat any soreness and discomfort from the procedure.  Food intake: Start with clear liquids (like water) and advance to regular food, as tolerated.   Physical activities: Keep activities to a minimum for the first 8 hours after the procedure.   Driving: If you have received any sedation, you are not allowed to drive for 24 hours after your procedure.  Blood thinner: Restart your blood thinner 6 hours after your procedure. (Only for those taking blood thinners)  Insulin: As soon as you can eat, you may resume your normal dosing schedule. (Only for those taking insulin)  Infection prevention: Keep procedure site clean and dry.  Post-procedure Pain Diary: Extremely important that this be done correctly and accurately. Recorded information will be used to determine the next step in treatment.  Pain evaluated is that of treated area only. Do not include pain from an untreated area.  Complete every hour, on the hour, for the initial 8 hours. Set an alarm to help you do this part accurately.  Do not go to sleep and have it completed later. It will not be accurate.  Follow-up appointment: Keep your follow-up appointment after the procedure. Usually 2 weeks for most procedures. (6 weeks in the case of radiofrequency.) Bring you pain diary.  Expect:  From numbing medicine (AKA: Local Anesthetics): Numbness or decrease in pain.  Onset: Full effect within 15 minutes of injected.  Duration: It will depend on the type of local anesthetic used. On the average, 1 to 8  hours.   From steroids: Decrease in swelling or inflammation. Once inflammation is improved, relief of the pain will follow.  Onset of benefits: Depends on the amount of swelling present. The more swelling, the longer it will take for the benefits to be seen.   Duration: Steroids will stay in the system x 2 weeks. Duration of benefits will depend on multiple posibilities including persistent irritating factors.  From procedure: Some discomfort is to be expected once the numbing medicine wears off. This should be minimal if ice and heat are applied as instructed. Call if:  You experience numbness and weakness that gets worse with time, as opposed to wearing off.  New onset bowel or bladder incontinence. (Spinal procedures only)  Emergency Numbers:  Durning business hours (Monday - Thursday, 8:00 AM - 4:00 PM) (Friday, 9:00 AM - 12:00 Noon): [phone]  After hours: [phone]   __________________________________________________________________________________________   Radiofrequency Lesioning Radiofrequency lesioning is a procedure that is performed to relieve pain. The procedure is often used for back, neck, or arm pain. Radiofrequency lesioning involves the use of a machine that creates radio waves to make heat. During the procedure, the heat is applied to the nerve that carries the pain signal. The heat damages the nerve and interferes with the pain signal. Pain relief usually starts about 2 weeks after the procedure and lasts for 6 months to 1 year. Tell a health care provider about: Any allergies you have. All medicines you are taking, including vitamins, herbs, eye drops, creams, and over-the-counter medicines. Any problems you or family  members have had with anesthetic medicines. Any blood disorders you have. Any surgeries you have had. Any medical conditions you have. Whether you are pregnant or may be pregnant. What are the risks? Generally, this is a safe  procedure. However, problems may occur, including: Pain or soreness at the injection site. Infection at the injection site. Damage to nerves or blood vessels. What happens before the procedure? Ask your health care provider about: Changing or stopping your regular medicines. This is especially important if you are taking diabetes medicines or blood thinners. Taking medicines such as aspirin and ibuprofen. These medicines can thin your blood. Do not take these medicines before your procedure if your health care provider instructs you not to. Follow instructions from your health care provider about eating or drinking restrictions. Plan to have someone take you home after the procedure. If you go home right after the procedure, plan to have someone with you for 24 hours. What happens during the procedure? You will be given one or more of the following: A medicine to help you relax (sedative). A medicine to numb the area (local anesthetic). You will be awake during the procedure. You will need to be able to talk with the health care provider during the procedure. With the help of a type of X-ray (fluoroscopy), the health care provider will insert a radiofrequency needle into the area to be treated. Next, a wire that carries the radio waves (electrode) will be put through the radiofrequency needle. An electrical pulse will be sent through the electrode to verify the correct nerve. You will feel a tingling sensation, and you may have muscle twitching. Then, the tissue that is around the needle tip will be heated by an electric current that is passed using the radiofrequency machine. This will numb the nerves. A bandage (dressing) will be put on the insertion area after the procedure is done. The procedure may vary among health care providers and hospitals. What happens after the procedure? Your blood pressure, heart rate, breathing rate, and blood oxygen level will be monitored often until the  medicines you were given have worn off. Return to your normal activities as directed by your health care provider. This information is not intended to replace advice given to you by your health care provider. Make sure you discuss any questions you have with your health care provider. Document Released: 05/11/2011 Document Revised: 02/18/2016 Document Reviewed: 10/20/2014 Elsevier Interactive Patient Education  2017 [employer].

## 2016-12-19 NOTE — Progress Notes (Signed)
Safety precautions to be maintained throughout the outpatient stay will include: orient to surroundings, keep bed in low position, maintain call bell within reach at all times, provide assistance with transfer out of bed and ambulation.  

## 2016-12-20 ENCOUNTER — Telehealth: Payer: Self-pay | Admitting: *Deleted

## 2016-12-20 NOTE — Telephone Encounter (Signed)
Left voicemail for patient to return call post procedure if having any problems.

## 2017-01-17 ENCOUNTER — Ambulatory Visit: Payer: BLUE CROSS/BLUE SHIELD | Admitting: Pain Medicine

## 2017-01-30 ENCOUNTER — Encounter: Payer: Self-pay | Admitting: Pain Medicine

## 2017-01-30 ENCOUNTER — Ambulatory Visit: Payer: Medicare Other | Attending: Pain Medicine | Admitting: Pain Medicine

## 2017-01-30 ENCOUNTER — Telehealth: Payer: Self-pay | Admitting: *Deleted

## 2017-01-30 VITALS — BP 136/82 | HR 101 | Temp 98.5°F | Resp 18 | Ht 68.0 in | Wt 155.0 lb

## 2017-01-30 DIAGNOSIS — Z8673 Personal history of transient ischemic attack (TIA), and cerebral infarction without residual deficits: Secondary | ICD-10-CM | POA: Diagnosis not present

## 2017-01-30 DIAGNOSIS — G894 Chronic pain syndrome: Secondary | ICD-10-CM | POA: Insufficient documentation

## 2017-01-30 DIAGNOSIS — E7800 Pure hypercholesterolemia, unspecified: Secondary | ICD-10-CM | POA: Insufficient documentation

## 2017-01-30 DIAGNOSIS — F17200 Nicotine dependence, unspecified, uncomplicated: Secondary | ICD-10-CM | POA: Insufficient documentation

## 2017-01-30 DIAGNOSIS — F1190 Opioid use, unspecified, uncomplicated: Secondary | ICD-10-CM

## 2017-01-30 DIAGNOSIS — M48061 Spinal stenosis, lumbar region without neurogenic claudication: Secondary | ICD-10-CM | POA: Diagnosis not present

## 2017-01-30 DIAGNOSIS — M5410 Radiculopathy, site unspecified: Secondary | ICD-10-CM

## 2017-01-30 DIAGNOSIS — M1611 Unilateral primary osteoarthritis, right hip: Secondary | ICD-10-CM | POA: Insufficient documentation

## 2017-01-30 DIAGNOSIS — M9983 Other biomechanical lesions of lumbar region: Secondary | ICD-10-CM

## 2017-01-30 DIAGNOSIS — Z79891 Long term (current) use of opiate analgesic: Secondary | ICD-10-CM | POA: Insufficient documentation

## 2017-01-30 DIAGNOSIS — M5416 Radiculopathy, lumbar region: Secondary | ICD-10-CM

## 2017-01-30 DIAGNOSIS — M47816 Spondylosis without myelopathy or radiculopathy, lumbar region: Secondary | ICD-10-CM

## 2017-01-30 DIAGNOSIS — M79604 Pain in right leg: Secondary | ICD-10-CM | POA: Insufficient documentation

## 2017-01-30 DIAGNOSIS — M4726 Other spondylosis with radiculopathy, lumbar region: Secondary | ICD-10-CM | POA: Diagnosis not present

## 2017-01-30 DIAGNOSIS — F329 Major depressive disorder, single episode, unspecified: Secondary | ICD-10-CM | POA: Diagnosis not present

## 2017-01-30 DIAGNOSIS — G8929 Other chronic pain: Secondary | ICD-10-CM | POA: Diagnosis not present

## 2017-01-30 DIAGNOSIS — N529 Male erectile dysfunction, unspecified: Secondary | ICD-10-CM | POA: Insufficient documentation

## 2017-01-30 DIAGNOSIS — M5124 Other intervertebral disc displacement, thoracic region: Secondary | ICD-10-CM | POA: Insufficient documentation

## 2017-01-30 DIAGNOSIS — F411 Generalized anxiety disorder: Secondary | ICD-10-CM | POA: Insufficient documentation

## 2017-01-30 DIAGNOSIS — N400 Enlarged prostate without lower urinary tract symptoms: Secondary | ICD-10-CM | POA: Insufficient documentation

## 2017-01-30 DIAGNOSIS — M25551 Pain in right hip: Secondary | ICD-10-CM

## 2017-01-30 DIAGNOSIS — M542 Cervicalgia: Secondary | ICD-10-CM | POA: Insufficient documentation

## 2017-01-30 DIAGNOSIS — M5125 Other intervertebral disc displacement, thoracolumbar region: Secondary | ICD-10-CM

## 2017-01-30 DIAGNOSIS — M546 Pain in thoracic spine: Secondary | ICD-10-CM | POA: Insufficient documentation

## 2017-01-30 DIAGNOSIS — M4696 Unspecified inflammatory spondylopathy, lumbar region: Secondary | ICD-10-CM

## 2017-01-30 DIAGNOSIS — Z8546 Personal history of malignant neoplasm of prostate: Secondary | ICD-10-CM | POA: Insufficient documentation

## 2017-01-30 DIAGNOSIS — J449 Chronic obstructive pulmonary disease, unspecified: Secondary | ICD-10-CM | POA: Diagnosis not present

## 2017-01-30 DIAGNOSIS — M792 Neuralgia and neuritis, unspecified: Secondary | ICD-10-CM

## 2017-01-30 DIAGNOSIS — M4722 Other spondylosis with radiculopathy, cervical region: Secondary | ICD-10-CM | POA: Diagnosis not present

## 2017-01-30 DIAGNOSIS — M545 Low back pain: Secondary | ICD-10-CM | POA: Diagnosis present

## 2017-01-30 MED ORDER — PREDNISONE 20 MG PO TABS: ORAL_TABLET | ORAL | 0 refills | Status: DC

## 2017-01-30 MED ORDER — OXYCODONE HCL 10 MG PO TABS
10.0000 mg | ORAL_TABLET | Freq: Three times a day (TID) | ORAL | 0 refills | Status: DC | PRN
Start: 2017-02-17 — End: 2017-04-19

## 2017-01-30 MED ORDER — OXYCODONE HCL 10 MG PO TABS: 10.0000 mg | ORAL_TABLET | Freq: Three times a day (TID) | ORAL | 0 refills | Status: DC | PRN

## 2017-01-30 MED ORDER — GABAPENTIN 800 MG PO TABS: 800.0000 mg | ORAL_TABLET | Freq: Four times a day (QID) | ORAL | 0 refills | Status: DC

## 2017-01-30 NOTE — Progress Notes (Signed)
Nursing Pain Medication Assessment:  Safety precautions to be maintained throughout the outpatient stay will include: orient to surroundings, keep bed in low position, maintain call bell within reach at all times, provide assistance with transfer out of bed and ambulation.  Medication Inspection Compliance: Pill count conducted under aseptic conditions, in front of the patient. Neither the pills nor the bottle was removed from the patient's sight at any time. Once count was completed pills were immediately returned to the patient in their original bottle.  Medication: Oxycodone/APAP Pill/Patch Count: 36 out of 30 Pill/Patch Appearance: Markings inconsistent with prescribed medication Bottle Appearance: Standard pharmacy container. Clearly labeled. Filled Date:04 / 08 / 2018 Last Medication intake:  Today

## 2017-01-30 NOTE — Progress Notes (Signed)
Patient's Name: Mark Hodges  MRN: [id number]  Referring Provider: Tracie Harrier, MD  DOB: [birth year]  PCP: Tracie Harrier, MD  DOS: 01/30/2017  Note by: Kathlen Brunswick. Dossie Arbour, MD  Service setting: Ambulatory outpatient  Specialty: Interventional Pain Management  Location: [facility] (AMB) Pain Management Facility    Patient type: Established   Primary Reason(s) for Visit: Encounter for prescription drug management & post-procedure evaluation of chronic illness with mild to moderate exacerbation(Level of risk: moderate) CC: Back Pain (mid and low)  HPI  Mark Hodges is a 61 y.o. year old, male patient, who comes today for a post-procedure evaluation and medication management. He has Difficulty in walking; Compulsive tobacco user syndrome; Hypercholesterolemia without hypertriglyceridemia; Long term current use of opiate analgesic; Long term prescription opiate use; Opiate use (15 MME/Day); Opiate dependence ([place]); Encounter for therapeutic drug level monitoring; Chronic low back pain (Location of Primary Source of Pain) (Bilateral) (R>L); Chronic lumbar radicular pain (Polyradiculopathy) (Right); Ataxia; HLD (hyperlipidemia); Current tobacco use; Pure hypercholesterolemia; Arm numbness; Substance use disorder Risk: LOW; Chronic upper back pain; Cervical spondylosis; Chronic neck pain; Cervicogenic headache; Chronic radicular cervical pain; Chronic hip pain (Location of Secondary source of pain) (Right); Generalized anxiety disorder; At high risk for falls; Erectile dysfunction; History of TIA (transient ischemic attack); 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 without associated mass lesion or cord expansion.; Abnormal MRI, lumbar spine (06/26/2015); Lumbar spondylosis; 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, by EMG/PNCV; Neuropathic pain; Chronic lower extremity pain (Right); History of prostate cancer; Lumbar facet arthropathy ([place]); 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 His primarily concern today is the Back Pain (mid and low)  Pain Assessment: Self-Reported Pain Score: 5 /10 Clinically the patient looks like a 2/10 Reported level is inconsistent with clinical observations. Information on the proper use of the pain scale provided to the patient today Pain Type: Chronic pain Pain Location: Back Pain Orientation: Lower, Right, Left Pain Descriptors / Indicators: Sharp, Dull, Aching, Stabbing Pain Frequency: Constant  [name] was last seen on 12/19/2016 for a procedure. During today's appointment we reviewed Mr. Basic's post-procedure results, as well as his outpatient medication regimen.  Further details on both, my assessment(s), as well as the proposed treatment plan, please see below.  Controlled Substance Pharmacotherapy Assessment REMS (Risk Evaluation and Mitigation Strategy)  Analgesic:Oxycodone/APAP 10/325 one daily MME/day:15 mg/day. Dewayne Shorter, RN  01/30/2017  1:56 PM  Signed Nursing Pain Medication Assessment:  Safety precautions to be maintained throughout the outpatient stay will include: orient to surroundings, keep bed in low position, maintain call bell within reach at all times, provide assistance with transfer out of bed and ambulation.  Medication Inspection Compliance: Pill count conducted under aseptic conditions, in front of the patient. Neither the pills nor the bottle was removed from the patient's sight at any time. Once count was completed pills were immediately returned to the patient in their original bottle.  Medication: Oxycodone/APAP Pill/Patch Count: 36 out of 30 Pill/Patch Appearance:  Markings inconsistent with prescribed medication Bottle Appearance: Standard pharmacy container. Clearly labeled. Filled Date:04 / 08 / 2018 Last Medication intake:  Today   Pharmacokinetics: Liberation and absorption (onset of action): WNL Distribution (time to peak effect): WNL Metabolism  and excretion (duration of action): WNL         Pharmacodynamics: Desired effects: Analgesia: [name] reports >50% benefit. Functional ability: Patient reports that medication allows him to accomplish basic ADLs Clinically meaningful improvement in function (CMIF): Sustained CMIF goals met Perceived effectiveness: Described as relatively effective, allowing for increase in activities of daily living (ADL) Undesirable effects: Side-effects or Adverse reactions: None reported Monitoring: [place] PMP: Online review of the past 71-monthperiod conducted. Compliant with practice rules and regulations List of all UDS test(s) done:  Lab Results  Component Value Date   TOXASSSELUR FINAL 02/08/2016   TOXASSSELUR FINAL 10/28/2015   Last UDS on record: ToxAssure Select 13  Date Value Ref Range Status  02/08/2016 FINAL  Final    Comment:    ==================================================================== TOXASSURE SELECT 13 (MW) ==================================================================== Test                             Result       Flag       Units Drug Present and Declared for Prescription Verification   Oxycodone                      163          EXPECTED   ng/mg creat   Oxymorphone                    1192         EXPECTED   ng/mg creat   Noroxycodone                   573          EXPECTED   ng/mg creat   Noroxymorphone                 487          EXPECTED   ng/mg creat    Sources of oxycodone are scheduled prescription medications.    Oxymorphone, noroxycodone, and noroxymorphone are expected    metabolites of oxycodone. Oxymorphone is also available as a    scheduled prescription  medication. ==================================================================== Test                      Result    Flag   Units      Ref Range   Creatinine              78               mg/dL      >=20 ==================================================================== Declared Medications:  The flagging and interpretation on this report are based on the  following declared medications.  Unexpected results may arise from  inaccuracies in the declared medications.  **Note: The testing scope of this panel includes these medications:  Oxycodone (Oxycodone Acetaminophen)  **Note: The testing scope of this panel does not include following  reported medications:  Acetaminophen (Oxycodone Acetaminophen)  Alprostadil  Aspirin  Atorvastatin  Gabapentin  Nortriptyline  Tamsulosin  Venlafaxine ==================================================================== For clinical consultation, please call [phone] ====================================================================    UDS interpretation: Compliant          Medication Assessment Form: Reviewed. Patient indicates being compliant with therapy Treatment compliance: Compliant Risk Assessment Profile: Aberrant behavior: See prior evaluations. None observed or detected today Comorbid factors increasing risk of overdose: See prior notes. No additional risks detected today Risk of substance use disorder (SUD):  Low Opioid Risk Tool (ORT) Total Score:    Interpretation Table:  Score <3 = Low Risk for SUD  Score between 4-7 = Moderate Risk for SUD  Score >8 = High Risk for Opioid Abuse   Risk Mitigation Strategies:  Patient Counseling: Covered Patient-Prescriber Agreement (PPA): Present and active  Notification to other healthcare providers: Done  Pharmacologic Plan: No change in therapy, at this time  Post-Procedure Assessment  12/19/2016 Procedure: Therapeutic left lumbar facet radiofrequency ablation under fluoroscopic  guidance and IV sedation Pre-procedure pain score:  8/10 Post-procedure pain score: 0/10 (100% relief) Influential Factors: BMI: 23.57 kg/m Intra-procedural challenges: None observed Assessment challenges: None detected         Post-procedural side-effects, adverse reactions, or complications: None reported Reported issues: None  Sedation: Sedation provided. When no sedatives are used, the analgesic levels obtained are directly associated to the effectiveness of the local anesthetics. However, when sedation is provided, the level of analgesia obtained during the initial 1 hour following the intervention, is believed to be the result of a combination of factors. These factors may include, but are not limited to: 1. The effectiveness of the local anesthetics used. 2. The effects of the analgesic(s) and/or anxiolytic(s) used. 3. The degree of discomfort experienced by the patient at the time of the procedure. 4. The patients ability and reliability in recalling and recording the events. 5. The presence and influence of possible secondary gains and/or psychosocial factors. Reported result: Relief experienced during the 1st hour after the procedure: 100 % (Ultra-Short Term Relief) Interpretative annotation: Analgesia during this period is likely to be Local Anesthetic and/or IV Sedative (Analgesic/Anxiolitic) related.          Effects of local anesthetic: The analgesic effects attained during this period are directly associated to the localized infiltration of local anesthetics and therefore cary significant diagnostic value as to the etiological location, or anatomical origin, of the pain. Expected duration of relief is directly dependent on the pharmacodynamics of the local anesthetic used. Long-acting (4-6 hours) anesthetics used.  Reported result: Relief during the next 4 to 6 hour after the procedure: 100 % (Short-Term Relief) Interpretative annotation: Complete relief would suggest area to be  the source of the pain.          Long-term benefit: Defined as the period of time past the expected duration of local anesthetics. With the possible exception of prolonged sympathetic blockade from the local anesthetics, benefits during this period are typically attributed to, or associated with, other factors such as analgesic sensory neuropraxia, antiinflammatory effects, or beneficial biochemical changes provided by agents other than the local anesthetics Reported result: Extended relief following procedure: 100 % (lasting 5 days, then mowed yard and pain returned to pre procedure pain level.) (Long-Term Relief) Interpretative annotation: Good relief. This could suggest inflammation to be a significant component in the etiology to the pain. He is currently having a flareup of his pain due to having gone into his riding lawnmower and mowed 5 acres of land.  Current benefits: Defined as persistent relief that continues at this point in time.   Reported results: Treated area: >50 % Mr. Schroepfer reports improvement in function. His lower extremity pain has not returned since the very first radiofrequency. Interpretative annotation: Ongoing benefits would suggest effective therapeutic approach  Interpretation: Results would suggest a successful intervention.          Laboratory Chemistry  Inflammation Markers Lab Results  Component Value Date   CRP 1.4 (H) 11/19/2015  ESRSEDRATE 5 11/19/2015   (CRP: Acute Phase) (ESR: Chronic Phase) Renal Function Markers Lab Results  Component Value Date   BUN 5 (L) 11/19/2015   CREATININE 0.73 11/19/2015   GFRAA >60 11/19/2015   GFRNONAA >60 11/19/2015   Hepatic Function Markers Lab Results  Component Value Date   AST 25 11/19/2015   ALT 24 11/19/2015   ALBUMIN 4.3 11/19/2015   ALKPHOS 92 11/19/2015   Electrolytes Lab Results  Component Value Date   NA 140 11/19/2015   K 3.9 11/19/2015   CL 104 11/19/2015   CALCIUM 9.7 11/19/2015   MG 2.0  11/19/2015   Neuropathy Markers No results found for: DXAJOINO67 Bone Pathology Markers Lab Results  Component Value Date   ALKPHOS 92 11/19/2015   CALCIUM 9.7 11/19/2015   Coagulation Parameters No results found for: INR, LABPROT, APTT, PLT Cardiovascular Markers No results found for: BNP, HGB, HCT Note: Lab results reviewed.  Recent Diagnostic Imaging Review  Dg C-arm 1-60 Min-no Report  Result Date: 12/19/2016 Fluoroscopy was utilized by the requesting physician.  No radiographic interpretation.   Note: Imaging results reviewed.          Meds  The patient has a current medication list which includes the following prescription(s): alprostadil, aspirin, atorvastatin, cyanocobalamin, diazepam, duloxetine, gabapentin, lamotrigine, nortriptyline, oxycodone hcl, oxycodone hcl, oxycodone hcl, prednisone, ropinirole, sertraline, tamsulosin, and venlafaxine hcl.  Current Outpatient Prescriptions on File Prior to Visit  Medication Sig  . alprostadil (CAVERJECT IMPULSE) 10 MCG injection 10 mcg by Intracavitary route as needed for erectile dysfunction. use no more than 3 times per week  . aspirin 325 MG tablet Take 325 mg by mouth daily.   Marland Kitchen atorvastatin (LIPITOR) 20 MG tablet Take 20 mg by mouth daily at 6 PM.   . diazepam (VALIUM) 10 MG tablet Take 5 mg by mouth at bedtime as needed for anxiety.   . DULoxetine (CYMBALTA) 30 MG capsule 30 mg daily.   Marland Kitchen lamoTRIgine (LAMICTAL) 25 MG tablet Wk1:1tab night wk2:1tab 2xday wk3:1tab AM 2tabsPM wk4:2tabs 2xday wk5:2tabs AM 3tabs PM wk6:3tabs 2xday wk7:3tabs AM 4tabs PM, wk8:4tab2xday, wk9:4tabs AM 5tabs PM, wk10: 5tabs 2xday and continue that dose  . rOPINIRole (REQUIP) 0.5 MG tablet Take 0.5 mg by mouth at bedtime.   . sertraline (ZOLOFT) 50 MG tablet Take 50 mg by mouth daily.  . tamsulosin (FLOMAX) 0.4 MG CAPS capsule Take 1 capsule (0.4 mg total) by mouth daily.  . Venlafaxine HCl 225 MG TB24 Take 2 tablets by mouth daily.    No current  facility-administered medications on file prior to visit.    ROS  Constitutional: Denies any fever or chills Gastrointestinal: No reported hemesis, hematochezia, vomiting, or acute GI distress Musculoskeletal: Denies any acute onset joint swelling, redness, loss of ROM, or weakness Neurological: No reported episodes of acute onset apraxia, aphasia, dysarthria, agnosia, amnesia, paralysis, loss of coordination, or loss of consciousness  Allergies  Mr. Hann has No Known Allergies.  PFSH  Drug: Mr. Gorelik  reports that he does not use drugs. Alcohol:  reports that he does not drink alcohol. Tobacco:  reports that he has quit smoking. His smokeless tobacco use includes Chew. Medical:  has a past medical history of Arthritis; BPH (benign prostatic hyperplasia); Clinical depression (10/23/2014); Depression; Erectile dysfunction; Hyperlipidemia; Hypogonadism in male; Lower urinary tract infection; Premature ejaculation; Prostate cancer ([place]); Stroke [facility]); and TIA (transient ischemic attack). Family: family history includes Heart disease in his father.  Past Surgical History:  Procedure Laterality Date  .  COLONOSCOPY WITH PROPOFOL N/A 01/26/2016   Procedure: COLONOSCOPY WITH PROPOFOL;  Surgeon: Lollie Sails, MD;  Location: [facility] ENDOSCOPY;  Service: Endoscopy;  Laterality: N/A;  . ESOPHAGOGASTRODUODENOSCOPY (EGD) WITH PROPOFOL N/A 01/26/2016   Procedure: ESOPHAGOGASTRODUODENOSCOPY (EGD) WITH PROPOFOL;  Surgeon: Lollie Sails, MD;  Location: [facility] ENDOSCOPY;  Service: Endoscopy;  Laterality: N/A;  . TONSILLECTOMY     Constitutional Exam  General appearance: Well nourished, well developed, and well hydrated. In no apparent acute distress Vitals:   01/30/17 1346  BP: 136/82  Pulse: (!) 101  Resp: 18  Temp: 98.5 F (36.9 C)  SpO2: 99%  Weight: 155 lb (70.3 kg)  Height: _0  (1.727 m)   BMI Assessment: Estimated body mass index is 23.57 kg/m as calculated from the following:    Height as of this encounter: _1  (1.727 m).   Weight as of this encounter: 155 lb (70.3 kg).  BMI interpretation table: BMI level Category Range association with higher incidence of chronic pain  <18 kg/m2 Underweight   18.5-24.9 kg/m2 Ideal body weight   25-29.9 kg/m2 Overweight Increased incidence by 20%  30-34.9 kg/m2 Obese (Class I) Increased incidence by 68%  35-39.9 kg/m2 Severe obesity (Class II) Increased incidence by 136%  >40 kg/m2 Extreme obesity (Class III) Increased incidence by 254%   BMI Readings from Last 4 Encounters:  01/30/17 23.57 kg/m  12/19/16 22.81 kg/m  10/24/16 21.13 kg/m  10/05/16 22.81 kg/m   Wt Readings from Last 4 Encounters:  01/30/17 155 lb (70.3 kg)  12/19/16 150 lb (68 kg)  10/24/16 139 lb (63 kg)  10/05/16 150 lb (68 kg)  Psych/Mental status: Alert, oriented x 3 (person, place, & time)       Eyes: PERLA Respiratory: No evidence of acute respiratory distress  Cervical Spine Exam  Inspection: No masses, redness, or swelling Alignment: Symmetrical Functional ROM: Unrestricted ROM      Stability: No instability detected Muscle strength & Tone: Functionally intact Sensory: Unimpaired Palpation: No palpable anomalies              Upper Extremity (UE) Exam    Side: Right upper extremity  Side: Left upper extremity  Inspection: No masses, redness, swelling, or asymmetry. No contractures  Inspection: No masses, redness, swelling, or asymmetry. No contractures  Functional ROM: Unrestricted ROM          Functional ROM: Unrestricted ROM          Muscle strength & Tone: Functionally intact  Muscle strength & Tone: Functionally intact  Sensory: Unimpaired  Sensory: Unimpaired  Palpation: No palpable anomalies              Palpation: No palpable anomalies              Specialized Test(s): Deferred         Specialized Test(s): Deferred          Thoracic Spine Exam  Inspection: No masses, redness, or swelling Alignment: Symmetrical Functional  ROM: Unrestricted ROM Stability: No instability detected Sensory: Unimpaired Muscle strength & Tone: No palpable anomalies  Lumbar Spine Exam  Inspection: No masses, redness, or swelling Alignment: Symmetrical Functional ROM: Unrestricted ROM      Stability: No instability detected Muscle strength & Tone: Functionally intact Sensory: Unimpaired Palpation: No palpable anomalies       Provocative Tests: Lumbar Hyperextension and rotation test: evaluation deferred today       Patrick's Maneuver: evaluation deferred today  Gait & Posture Assessment  Ambulation: Unassisted Gait: Relatively normal for age and body habitus Posture: WNL   Lower Extremity Exam    Side: Right lower extremity  Side: Left lower extremity  Inspection: No masses, redness, swelling, or asymmetry. No contractures  Inspection: No masses, redness, swelling, or asymmetry. No contractures  Functional ROM: Unrestricted ROM          Functional ROM: Unrestricted ROM          Muscle strength & Tone: Functionally intact  Muscle strength & Tone: Functionally intact  Sensory: Unimpaired  Sensory: Unimpaired  Palpation: No palpable anomalies  Palpation: No palpable anomalies   Assessment  Primary Diagnosis & Pertinent Problem List: The primary encounter diagnosis was Lumbar facet syndrome (Location of Primary Source of Pain) (Bilateral) (R>L). 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  Status  Diagnosis  Controlled Controlled Controlled 1. Lumbar facet syndrome (Location of Primary Source of Pain) (Bilateral) (R>L)   2. Lumbar spondylosis   3. Chronic low back pain (Location of Primary Source of Pain) (Bilateral) (R>L)   4. Chronic lumbar radicular pain (Polyradiculopathy) (Right)   5. 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 Chronic lower extremity pain (Right)   7. 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, by EMG/PNCV   8. Chronic hip pain (Location of Secondary source of pain) (Right)   9. Osteoarthritis of hip (Right)   10. Neuropathic pain   11. Chronic pain syndrome   12. Long term prescription opiate use   13. Opiate use (15 MME/Day)   14. T12-L1 disc extrusion      Plan of Care  Pharmacotherapy (Medications Ordered): Meds ordered this encounter  Medications  . Oxycodone HCl 10 MG TABS    Sig: Take 1 tablet (10 mg total) by mouth every 8 (eight) hours as needed.    Dispense:  90 tablet    Refill:  0    Do not place this medication, or any other prescription from our practice, on "Automatic Refill". Patient may have prescription filled one day early if pharmacy is closed on scheduled refill date. Do not fill until: 03/19/17 To last until: 04/18/17  . Oxycodone HCl 10 MG TABS    Sig: Take 1 tablet (10 mg total) by mouth every 8 (eight) hours as needed.    Dispense:  90 tablet    Refill:  0    Do not place this medication, or any other prescription from our practice, on "Automatic Refill". Patient may have prescription filled one day early if pharmacy is closed on scheduled refill date. Do not fill until: 02/17/17 To last until: 03/19/17  . gabapentin (NEURONTIN) 800 MG tablet    Sig: Take 1 tablet (800 mg total) by mouth every 6 (six) hours.    Dispense:  360 tablet    Refill:  0    Do not place this medication, or any other prescription from our practice, on "Automatic Refill". Patient may have prescription filled one day early if pharmacy is  closed on scheduled refill date.  . Oxycodone HCl 10 MG TABS  Sig: Take 1 tablet (10 mg total) by mouth every 8 (eight) hours as needed.    Dispense:  90 tablet    Refill:  0    Do not place this medication, or any other prescription from our practice, on "Automatic Refill". Patient may have prescription filled one day early if pharmacy is closed on scheduled refill date. Do not fill until: 04/18/17 To last until: 05/18/17  . predniSONE (DELTASONE) 20 MG tablet    Sig: Take 3 tab(s) in the morning x 3 days, then 2 tab(s) x 3 days, followed by 1 tab x 3 days.    Dispense:  42 tablet    Refill:  0    Do not add to the "Automatic Refill" notification system.   New Prescriptions   GABAPENTIN (NEURONTIN) 800 MG TABLET    Take 1 tablet (800 mg total) by mouth every 6 (six) hours.   PREDNISONE (DELTASONE) 20 MG TABLET    Take 3 tab(s) in the morning x 3 days, then 2 tab(s) x 3 days, followed by 1 tab x 3 days.   Medications administered today: Mr. Gurka had no medications administered during this visit. Lab-work, procedure(s), and/or referral(s): Orders Placed This Encounter  Procedures  . LUMBAR FACET(MEDIAL BRANCH NERVE BLOCK) MBNB  . HIP INJECTION  . Lumbar Transforaminal Epidural  . Lumbar Epidural Injection  . ToxASSURE Select 13 (MW), Urine   Imaging and/or referral(s): None  Interventional therapies: Planned, scheduled, and/or pending:   Not at this time.   Considering:   Palliative bilateral lumbar facet radiofrequency ablation (Right done on 08/10/2016 with complete resolution of the right lower extremity pain and more than 50% relief of the right sided low back pain.)(Left done 12/19/16) Diagnostic right intra-articular hip joint injection (first diagnostic completed on 11/13/2015. 75% relief with the local anesthetic.)  Diagnostic bilateral lumbar facet block (first done on 02/16/2016. 100% relief with local anesthetic) (second done on 03/08/2016. 100% relief with local  anesthetic)    Palliative PRN treatment(s):   Palliative bilateral lumbar facet block  Palliative bilateral lumbar facet RFA (right: 08/10/2016; left: 12/19/2016) Palliative right intra-articular hip joint injection  Diagnostic bilateral L3-4 transforaminal epidural steroid injection  Diagnostic midline T12-L1 lumbar epidural steroid injection    Provider-requested follow-up: Return in 3 months (on 05/02/2017) for Med-Mgmt, by MD, in addition, PRN procedure(s), w/ MD.  Future Appointments Date Time Provider [place]  02/22/2017 3:30 PM Hollice Espy, MD [facility] None   Primary Care Physician: Tracie Harrier, MD Location: [facility] Note by: Kathlen Brunswick. Dossie Arbour, M.D, DABA, DABAPM, DABPM, DABIPP, FIPP Date: 01/30/2017; Time: 4:49 PM  Patient instructions provided during this appointment: Patient Instructions  Today, you were given 3 prescriptions for oxycodone.  You have Gabapentin and Prednisone sent in to your pharmacy for pick up.      Preemptive Analgesia Technique  Definition:   A technique used to minimize the effects of an activity known to cause inflammation or swelling, which in turn leads to an increase in pain.  Purpose: To prevent swelling from occurring. It is based on the fact that it is easier to prevent swelling from happening than it is to get rid of it, once it occurs.  Contraindications: 1. Anyone with allergy or hypersensitivity to the recommended medications. 2. Anyone taking anticoagulants (Blood Thinners) (e.g., Coumadin, Warfarin, Plavix, etc.). 3. Patients in Renal Failure.  Technique: Before you undertake an activity known to cause pain, or a flare-up of your chronic pain,  and before you experience any pain, do the following:  1. On a full stomach, take 4 (four) over the counter Ibuprofens 265m tablets (Motrin), for a total of 800 mg. 2. In addition, take over the counter Magnesium 400 to 500 mg, before doing  the activity.  3. Six (6) hours later, again on a full stomach, repeat the Ibuprofen. 4. That night, take a warm shower and stretch under the running warm water.  This technique may be sufficient to abort the pain and discomfort before it happens. Keep in mind that it takes a lot less medication to prevent swelling than it takes to eliminate it once it occurs.  _____________________________________________________________________________________________

## 2017-01-30 NOTE — Patient Instructions (Addendum)
Today, you were given 3 prescriptions for oxycodone.  You have Gabapentin and Prednisone sent in to your pharmacy for pick up.      Preemptive Analgesia Technique  Definition:   A technique used to minimize the effects of an activity known to cause inflammation or swelling, which in turn leads to an increase in pain.  Purpose: To prevent swelling from occurring. It is based on the fact that it is easier to prevent swelling from happening than it is to get rid of it, once it occurs.  Contraindications: 1. Anyone with allergy or hypersensitivity to the recommended medications. 2. Anyone taking anticoagulants (Blood Thinners) (e.g., Coumadin, Warfarin, Plavix, etc.). 3. Patients in Renal Failure.  Technique: Before you undertake an activity known to cause pain, or a flare-up of your chronic pain, and before you experience any pain, do the following:  1. On a full stomach, take 4 (four) over the counter Ibuprofens 200mg  tablets (Motrin), for a total of 800 mg. 2. In addition, take over the counter Magnesium 400 to 500 mg, before doing the activity.  3. Six (6) hours later, again on a full stomach, repeat the Ibuprofen. 4. That night, take a warm shower and stretch under the running warm water.  This technique may be sufficient to abort the pain and discomfort before it happens. Keep in mind that it takes a lot less medication to prevent swelling than it takes to eliminate it once it occurs.  _____________________________________________________________________________________________

## 2017-02-01 ENCOUNTER — Ambulatory Visit: Payer: BLUE CROSS/BLUE SHIELD | Admitting: Pain Medicine

## 2017-02-06 LAB — TOXASSURE SELECT 13 (MW), URINE

## 2017-02-22 ENCOUNTER — Encounter: Payer: Self-pay | Admitting: Urology

## 2017-02-22 ENCOUNTER — Ambulatory Visit (INDEPENDENT_AMBULATORY_CARE_PROVIDER_SITE_OTHER): Payer: Medicare Other | Admitting: Urology

## 2017-02-22 VITALS — BP 112/70 | HR 93 | Ht 68.0 in | Wt 153.0 lb

## 2017-02-22 DIAGNOSIS — C61 Malignant neoplasm of prostate: Secondary | ICD-10-CM

## 2017-02-22 MED ORDER — TAMSULOSIN HCL 0.4 MG PO CAPS: 0.8000 mg | ORAL_CAPSULE | Freq: Every day | ORAL | 3 refills | Status: DC

## 2017-02-22 NOTE — Progress Notes (Signed)
5:53 PM  02/22/17   Mark Hodges 1956-05-14 [id number]  Referring provider: Tracie Harrier, MD [address] [facility] [place], [place] [address]  Chief Complaint  Patient presents with  . Follow-up    HPI:  Prostate cancer  61 yo M with diagnosed in 01/2014 with T1c Gleason 3+3 prostate cancer in 2/12 cores, 1-3% and single core HGPIN dx 01/2014. PSA at time of dx 5.41. TRUS vol 34 cc. He has been on active surveillance.    Repeat biopsy on 02/20/15 per active surveillance protocol which showed only a small focus of atypical glands, suspicious for carcinoma   TRUS vol 33 cc.     His PSA has been relatively stable in the 5-7 range.  Most recent PSA slightly up to 8.3 from 6.8  He did have in interval PSA by his PCP on 08/29/16 which was 10.54 with [employer] Coul[employer] Hybritech Assay.    He returns today for PSA/ DRE.  LUTS History of LUTS, urinary frequency which has improved on Flomax 0.4 mg. PVR minimal on previous visits.   He reports today that occasionally, he has difficulty starting his urinary stream and was set on the 25-30 minutes in order to empty his bladder.  During these times, he reports that his mind is racing and he has had anxiety about various issues.  ED Baseline ED, failed multiple PDE5i. No sucess with VED.  Currently good success with caverject 10 mcg.  Working very well.  Using about 1/ month.  No penile scarring of pain.  No curvature.   PHx chronic back pain managed by pain clinic.     PMH: Past Medical History:  Diagnosis Date  . Arthritis   . BPH (benign prostatic hyperplasia)   . Clinical depression 10/23/2014  . Depression   . Erectile dysfunction   . Hyperlipidemia   . Hypogonadism in male   . Lower urinary tract infection   . Premature ejaculation   . Prostate cancer ([place])   . Stroke ([place])   . TIA (transient ischemic attack)     Surgical History: Past Surgical History:  Procedure Laterality Date  .  COLONOSCOPY WITH PROPOFOL N/A 01/26/2016   Procedure: COLONOSCOPY WITH PROPOFOL;  Surgeon: Lollie Sails, MD;  Location[facility] ENDOSCOPY;  Service: Endoscopy;  Laterality: N/A;  . ESOPHAGOGASTRODUODENOSCOPY (EGD) WITH PROPOFOL N/A 01/26/2016   Procedure: ESOPHAGOGASTRODUODENOSCOPY (EGD) WITH PROPOFOL;  Surgeon: Lollie Sails, MD;  Location: [facility] ENDOSCOPY;  Service: Endoscopy;  Laterality: N/A;  . TONSILLECTOMY      Home Medications:  Allergies as of 02/22/2017   No Known Allergies     Medication List       Accurate as of 02/22/17  5:53 PM. Always use your most recent med list.          alprostadil 10 MCG injection Commonly known as:  CAVERJECT IMPULSE 10 mcg by Intracavitary route as needed for erectile dysfunction. use no more than 3 times per week   aspirin 325 MG tablet Take 325 mg by mouth daily.   atorvastatin 20 MG tablet Commonly known as:  LIPITOR Take 20 mg by mouth daily at 6 PM.   cyanocobalamin 1000 MCG/ML injection Commonly known as:  (VITAMIN B-12)   diazepam 10 MG tablet Commonly known as:  VALIUM Take 5 mg by mouth at bedtime as needed for anxiety.   DULoxetine 30 MG capsule Commonly known as:  CYMBALTA 30 mg daily.   gabapentin 800 MG tablet Commonly known  as:  NEURONTIN Take 1 tablet (800 mg total) by mouth every 6 (six) hours.   lamoTRIgine 25 MG tablet Commonly known as:  LAMICTAL Wk1:1tab night wk2:1tab 2xday wk3:1tab AM 2tabsPM wk4:2tabs 2xday wk5:2tabs AM 3tabs PM wk6:3tabs 2xday wk7:3tabs AM 4tabs PM, wk8:4tab2xday, wk9:4tabs AM 5tabs PM, wk10: 5tabs 2xday and continue that dose   nortriptyline 10 MG capsule Commonly known as:  PAMELOR Take 1 capsule at bedtime in addition to one 50 mg capsule to equal 60 mg at night   Oxycodone HCl 10 MG Tabs Take 1 tablet (10 mg total) by mouth every 8 (eight) hours as needed.   Oxycodone HCl 10 MG Tabs Take 1 tablet (10 mg total) by mouth every 8 (eight) hours as needed. Start taking on:   03/19/2017   Oxycodone HCl 10 MG Tabs Take 1 tablet (10 mg total) by mouth every 8 (eight) hours as needed. Start taking on:  04/18/2017   predniSONE 20 MG tablet Commonly known as:  DELTASONE Take 3 tab(s) in the morning x 3 days, then 2 tab(s) x 3 days, followed by 1 tab x 3 days.   rOPINIRole 0.5 MG tablet Commonly known as:  REQUIP Take 0.5 mg by mouth at bedtime.   sertraline 50 MG tablet Commonly known as:  ZOLOFT Take 50 mg by mouth daily.   tamsulosin 0.4 MG Caps capsule Commonly known as:  FLOMAX Take 2 capsules (0.8 mg total) by mouth daily.   Venlafaxine HCl 225 MG Tb24 Take 2 tablets by mouth daily.       Allergies: No Known Allergies  Family History: Family History  Problem Relation Age of Onset  . Heart disease Father   . Prostate cancer Neg Hx   . Bladder Cancer Neg Hx     Social History:  reports that he has quit smoking. His smokeless tobacco use includes Chew. He reports that he does not drink alcohol or use drugs.   ROS: UROLOGY Frequent Urination?: No Hard to postpone urination?: No Burning/pain with urination?: No Get up at night to urinate?: No Leakage of urine?: No Urine stream starts and stops?: No Trouble starting stream?: No Do you have to strain to urinate?: No Blood in urine?: No Urinary tract infection?: No Sexually transmitted disease?: No Injury to kidneys or bladder?: No Painful intercourse?: No Weak stream?: No Erection problems?: No Penile pain?: No Gastrointestinal Nausea?: No Vomiting?: No Indigestion/heartburn?: No Diarrhea?: No Constipation?: No Constitutional Fever: No Night sweats?: No Weight loss?: Yes Fatigue?: Yes Skin Skin rash/lesions?: No Itching?: No Eyes Blurred vision?: No Double vision?: No Ears/Nose/Throat Sore throat?: No Sinus problems?: No Hematologic/Lymphatic Swollen glands?: No Easy bruising?: No Cardiovascular Leg swelling?: No Chest pain?: No Respiratory Cough?: No Shortness  of breath?: Yes Endocrine Excessive thirst?: No Musculoskeletal Back pain?: Yes Joint pain?: No Neurological Headaches?: Yes Dizziness?: Yes Psychologic Depression?: Yes Anxiety?: Yes   Physical Exam: BP 112/70 (BP Location: Left Arm, Patient Position: Sitting, Cuff Size: Normal)   Pulse 93   Ht 5\' 8"  (1.727 m)   Wt 153 lb (69.4 kg)   BMI 23.26 kg/m   Constitutional:  Alert and oriented, No acute distress. Ambulating with walker.  Presents with wife today HEENT: [place] AT, moist mucus membranes.  Trachea midline, no masses. Cardiovascular: No clubbing, cyanosis, or edema. Respiratory: Normal respiratory effort, no increased work of breathing. GI: Abdomen is soft, nontender, nondistended, no abdominal masses GU: No CVA tenderness.  40g gland, no nodules.  Normal external sphincter.  Skin: No rashes, bruises or suspicious lesions. Neurologic: Grossly intact, no focal deficits, moving all 4 extremities. Psychiatric: Normal mood and affect.   Labs: Component     Latest Ref Rng & Units 08/06/2015 12/04/2015 04/21/2016 10/21/2016  PSA     0.0 - 4.0 ng/mL 6.8 (H) 7.5 (H) 6.8 (H) 8.3 (H)     Assessment & Plan:   1. Prostate cancer  Diagnosed in 01/2014 with T1c Gleason 3+3 prostate cancer in 2/12 cores, 1-3% and single core HGPIN dx 01/2014. PSA at time of dx 5.41. Repeat bx 01/2015,  Single focus of atypical cells but no evidence of clinically signficant cancer despite rising PSA to 7.0.   Rectal exam unchanged, PSA pending Recommend continuation of active surveillance, every 6 months if PSA remains stable  2. Other male erectile dysfunction Continue Caverject 10 mcg prn  3. LUTS/ urinary frequency Consider increasing dose of Flomax to 0.8 mg, possible side effects including orthostatic hypotension discussed Discussed that occasional inability to void related to pelvic floor dysfunction, inability to relax more than BPH Consider referral to physical therapy if this issue fails  to resolve   Return in about 6 months (around 08/25/2017) for PSA/ DRE.  Hollice Espy, MD  [facility] [address] [place], [place] [address] [phone]

## 2017-02-23 ENCOUNTER — Telehealth: Payer: Self-pay

## 2017-02-23 LAB — PSA: Prostate Specific Ag, Serum: 6.7 ng/mL — ABNORMAL HIGH (ref 0.0–4.0)

## 2017-02-23 NOTE — Telephone Encounter (Signed)
-----   Message from Hollice Espy, MD sent at 02/23/2017  8:01 AM EDT ----- Doristine Devoid news, your PSA is back down to 6.7 which is her baseline. See you in 6 months.  Hollice Espy, MD

## 2017-02-23 NOTE — Telephone Encounter (Signed)
Spoke with pt wife in reference to PSA results. Wife voiced understanding.  

## 2017-03-14 ENCOUNTER — Emergency Department: Payer: Medicare Other

## 2017-03-14 ENCOUNTER — Emergency Department
Admission: EM | Admit: 2017-03-14 | Discharge: 2017-03-14 | Disposition: A | Discharge status: Home / Self Care | Payer: Medicare Other | Attending: Emergency Medicine | Admitting: Emergency Medicine

## 2017-03-14 ENCOUNTER — Encounter: Payer: Self-pay | Admitting: Emergency Medicine

## 2017-03-14 DIAGNOSIS — R51 Headache: Secondary | ICD-10-CM | POA: Diagnosis not present

## 2017-03-14 DIAGNOSIS — Z7982 Long term (current) use of aspirin: Secondary | ICD-10-CM | POA: Diagnosis not present

## 2017-03-14 DIAGNOSIS — Y999 Unspecified external cause status: Secondary | ICD-10-CM | POA: Insufficient documentation

## 2017-03-14 DIAGNOSIS — M545 Low back pain: Secondary | ICD-10-CM | POA: Diagnosis not present

## 2017-03-14 DIAGNOSIS — Y939 Activity, unspecified: Secondary | ICD-10-CM | POA: Diagnosis not present

## 2017-03-14 DIAGNOSIS — Y92410 Unspecified street and highway as the place of occurrence of the external cause: Secondary | ICD-10-CM | POA: Diagnosis not present

## 2017-03-14 DIAGNOSIS — Z8546 Personal history of malignant neoplasm of prostate: Secondary | ICD-10-CM | POA: Diagnosis not present

## 2017-03-14 DIAGNOSIS — Z8673 Personal history of transient ischemic attack (TIA), and cerebral infarction without residual deficits: Secondary | ICD-10-CM | POA: Diagnosis not present

## 2017-03-14 DIAGNOSIS — J449 Chronic obstructive pulmonary disease, unspecified: Secondary | ICD-10-CM | POA: Diagnosis not present

## 2017-03-14 DIAGNOSIS — F17200 Nicotine dependence, unspecified, uncomplicated: Secondary | ICD-10-CM | POA: Insufficient documentation

## 2017-03-14 DIAGNOSIS — M542 Cervicalgia: Secondary | ICD-10-CM | POA: Diagnosis present
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
## 2017-04-19 ENCOUNTER — Ambulatory Visit: Payer: Medicare Other | Attending: Pain Medicine | Admitting: Pain Medicine

## 2017-04-19 ENCOUNTER — Encounter: Payer: Self-pay | Admitting: Pain Medicine

## 2017-04-19 VITALS — BP 147/76 | HR 102 | Temp 98.4°F | Resp 16 | Ht 68.0 in | Wt 150.0 lb

## 2017-04-19 DIAGNOSIS — M4692 Unspecified inflammatory spondylopathy, cervical region: Secondary | ICD-10-CM | POA: Diagnosis not present

## 2017-04-19 DIAGNOSIS — M5481 Occipital neuralgia: Secondary | ICD-10-CM | POA: Diagnosis not present

## 2017-04-19 DIAGNOSIS — Z9181 History of falling: Secondary | ICD-10-CM | POA: Insufficient documentation

## 2017-04-19 DIAGNOSIS — S134XXA Sprain of ligaments of cervical spine, initial encounter: Secondary | ICD-10-CM | POA: Insufficient documentation

## 2017-04-19 DIAGNOSIS — Z8546 Personal history of malignant neoplasm of prostate: Secondary | ICD-10-CM | POA: Diagnosis not present

## 2017-04-19 DIAGNOSIS — Z8673 Personal history of transient ischemic attack (TIA), and cerebral infarction without residual deficits: Secondary | ICD-10-CM | POA: Diagnosis not present

## 2017-04-19 DIAGNOSIS — F411 Generalized anxiety disorder: Secondary | ICD-10-CM | POA: Diagnosis not present

## 2017-04-19 DIAGNOSIS — Z5181 Encounter for therapeutic drug level monitoring: Secondary | ICD-10-CM | POA: Insufficient documentation

## 2017-04-19 DIAGNOSIS — M47812 Spondylosis without myelopathy or radiculopathy, cervical region: Secondary | ICD-10-CM | POA: Diagnosis not present

## 2017-04-19 DIAGNOSIS — N400 Enlarged prostate without lower urinary tract symptoms: Secondary | ICD-10-CM | POA: Insufficient documentation

## 2017-04-19 DIAGNOSIS — J449 Chronic obstructive pulmonary disease, unspecified: Secondary | ICD-10-CM | POA: Insufficient documentation

## 2017-04-19 DIAGNOSIS — Z79891 Long term (current) use of opiate analgesic: Secondary | ICD-10-CM | POA: Diagnosis not present

## 2017-04-19 DIAGNOSIS — R209 Unspecified disturbances of skin sensation: Secondary | ICD-10-CM | POA: Insufficient documentation

## 2017-04-19 DIAGNOSIS — Z7982 Long term (current) use of aspirin: Secondary | ICD-10-CM | POA: Diagnosis not present

## 2017-04-19 DIAGNOSIS — M5116 Intervertebral disc disorders with radiculopathy, lumbar region: Secondary | ICD-10-CM | POA: Insufficient documentation

## 2017-04-19 DIAGNOSIS — E7800 Pure hypercholesterolemia, unspecified: Secondary | ICD-10-CM | POA: Insufficient documentation

## 2017-04-19 DIAGNOSIS — F1190 Opioid use, unspecified, uncomplicated: Secondary | ICD-10-CM | POA: Diagnosis not present

## 2017-04-19 DIAGNOSIS — F39 Unspecified mood [affective] disorder: Secondary | ICD-10-CM | POA: Insufficient documentation

## 2017-04-19 DIAGNOSIS — M160 Bilateral primary osteoarthritis of hip: Secondary | ICD-10-CM | POA: Insufficient documentation

## 2017-04-19 DIAGNOSIS — Z79899 Other long term (current) drug therapy: Secondary | ICD-10-CM | POA: Insufficient documentation

## 2017-04-19 DIAGNOSIS — R200 Anesthesia of skin: Secondary | ICD-10-CM | POA: Insufficient documentation

## 2017-04-19 DIAGNOSIS — G894 Chronic pain syndrome: Secondary | ICD-10-CM | POA: Diagnosis present

## 2017-04-19 DIAGNOSIS — F329 Major depressive disorder, single episode, unspecified: Secondary | ICD-10-CM | POA: Insufficient documentation

## 2017-04-19 DIAGNOSIS — G8929 Other chronic pain: Secondary | ICD-10-CM

## 2017-04-19 DIAGNOSIS — F17200 Nicotine dependence, unspecified, uncomplicated: Secondary | ICD-10-CM | POA: Diagnosis not present

## 2017-04-19 DIAGNOSIS — M542 Cervicalgia: Secondary | ICD-10-CM | POA: Diagnosis not present

## 2017-04-19 DIAGNOSIS — M48061 Spinal stenosis, lumbar region without neurogenic claudication: Secondary | ICD-10-CM | POA: Diagnosis not present

## 2017-04-19 MED ORDER — OXYCODONE HCL 10 MG PO TABS: 10.0000 mg | ORAL_TABLET | Freq: Three times a day (TID) | ORAL | 0 refills | Status: DC | PRN

## 2017-04-19 NOTE — Patient Instructions (Signed)
____________________________________________________________________________________________  Pain Scale  Introduction: The pain score used by this practice is the Verbal Numerical Rating Scale (VNRS-11). This is an 11-point scale. It is for adults and children 10 years or older. There are significant differences in how the pain score is reported, used, and applied. Forget everything you learned in the past and learn this scoring system.  General Information: The scale should reflect your current level of pain. Unless you are specifically asked for the level of your worst pain, or your average pain. If you are asked for one of these two, then it should be understood that it is over the past 24 hours.  Basic Activities of Daily Living (ADL): Personal hygiene, dressing, eating, transferring, and using restroom.  Instructions: Most patients tend to report their level of pain as a combination of two factors, their physical pain and their psychosocial pain. This last one is also known as "suffering" and it is reflection of how physical pain affects you socially and psychologically. From now on, report them separately. From this point on, when asked to report your pain level, report only your physical pain. Use the following table for reference.  Pain Clinic Pain Levels (0-5/10)  Pain Level Score  Description  No Pain 0   Mild pain 1 Nagging, annoying, but does not interfere with basic activities of daily living (ADL). Patients are able to eat, bathe, get dressed, toileting (being able to get on and off the toilet and perform personal hygiene functions), transfer (move in and out of bed or a chair without assistance), and maintain continence (able to control bladder and bowel functions). Blood pressure and heart rate are unaffected. A normal heart rate for a healthy adult ranges from 60 to 100 bpm (beats per minute).   Mild to moderate pain 2 Noticeable and distracting. Impossible to hide from other  people. More frequent flare-ups. Still possible to adapt and function close to normal. It can be very annoying and may have occasional stronger flare-ups. With discipline, patients may get used to it and adapt.   Moderate pain 3 Interferes significantly with activities of daily living (ADL). It becomes difficult to feed, bathe, get dressed, get on and off the toilet or to perform personal hygiene functions. Difficult to get in and out of bed or a chair without assistance. Very distracting. With effort, it can be ignored when deeply involved in activities.   Moderately severe pain 4 Impossible to ignore for more than a few minutes. With effort, patients may still be able to manage work or participate in some social activities. Very difficult to concentrate. Signs of autonomic nervous system discharge are evident: dilated pupils (mydriasis); mild sweating (diaphoresis); sleep interference. Heart rate becomes elevated (>115 bpm). Diastolic blood pressure (lower number) rises above 100 mmHg. Patients find relief in laying down and not moving.   Severe pain 5 Intense and extremely unpleasant. Associated with frowning face and frequent crying. Pain overwhelms the senses.  Ability to do any activity or maintain social relationships becomes significantly limited. Conversation becomes difficult. Pacing back and forth is common, as getting into a comfortable position is nearly impossible. Pain wakes you up from deep sleep. Physical signs will be obvious: pupillary dilation; increased sweating; goosebumps; brisk reflexes; cold, clammy hands and feet; nausea, vomiting or dry heaves; loss of appetite; significant sleep disturbance with inability to fall asleep or to remain asleep. When persistent, significant weight loss is observed due to the complete loss of appetite and sleep deprivation.  Blood   pressure and heart rate becomes significantly elevated. Caution: If elevated blood pressure triggers a pounding headache  associated with blurred vision, then the patient should immediately seek attention at an urgent or emergency care unit, as these may be signs of an impending stroke.    Emergency Department Pain Levels (6-10/10)  Emergency Room Pain 6 Severely limiting. Requires emergency care and should not be seen or managed at an outpatient pain management facility. Communication becomes difficult and requires great effort. Assistance to reach the emergency department may be required. Facial flushing and profuse sweating along with potentially dangerous increases in heart rate and blood pressure will be evident.   Distressing pain 7 Self-care is very difficult. Assistance is required to transport, or use restroom. Assistance to reach the emergency department will be required. Tasks requiring coordination, such as bathing and getting dressed become very difficult.   Disabling pain 8 Self-care is no longer possible. At this level, pain is disabling. The individual is unable to do even the most "basic" activities such as walking, eating, bathing, dressing, transferring to a bed, or toileting. Fine motor skills are lost. It is difficult to think clearly.   Incapacitating pain 9 Pain becomes incapacitating. Thought processing is no longer possible. Difficult to remember your own name. Control of movement and coordination are lost.   The worst pain imaginable 10 At this level, most patients pass out from pain. When this level is reached, collapse of the autonomic nervous system occurs, leading to a sudden drop in blood pressure and heart rate. This in turn results in a temporary and dramatic drop in blood flow to the brain, leading to a loss of consciousness. Fainting is one of the body's self defense mechanisms. Passing out puts the brain in a calmed state and causes it to shut down for a while, in order to begin the healing process.    Summary: 1. Refer to this scale when providing [place] with your pain level. 2. Be  accurate and careful when reporting your pain level. This will help with your care. 3. Over-reporting your pain level will lead to loss of credibility. 4. Even a level of 1/10 means that there is pain and will be treated at our facility. 5. High, inaccurate reporting will be documented as "Symptom Exaggeration", leading to loss of credibility and suspicions of possible secondary gains such as obtaining more narcotics, or wanting to appear disabled, for fraudulent reasons. 6. Only pain levels of 5 or below will be seen at our facility. 7. Pain levels of 6 and above will be sent to the Emergency Department and the appointment cancelled. ____________________________________________________________________________________________  ____________________________________________________________________________________________  Preparing for Procedure with Sedation Instructions: . Oral Intake: Do not eat or drink anything for at least 8 hours prior to your procedure. . Transportation: Public transportation is not allowed. Bring an adult driver. The driver must be physically present in our waiting room before any procedure can be started. [facility] Kitchen Physical Assistance: Bring an adult physically capable of assisting you, in the event you need help. This adult should keep you company at home for at least 6 hours after the procedure. . Blood Pressure Medicine: Take your blood pressure medicine with a sip of water the morning of the procedure. . Blood thinners:  . Diabetics on insulin: Notify the staff so that you can be scheduled 1st case in the morning. If your diabetes requires high dose insulin, take only  of your normal insulin dose the morning of the procedure and notify the  staff that you have done so. . Preventing infections: Shower with an antibacterial soap the morning of your procedure. . Build-up your immune system: Take 1000 mg of Vitamin C with every meal (3 times a day) the day prior to your  procedure. Marland Kitchen Antibiotics: Inform the staff if you have a condition or reason that requires you to take antibiotics before dental procedures. . Pregnancy: If you are pregnant, call and cancel the procedure. . Sickness: If you have a cold, fever, or any active infections, call and cancel the procedure. . Arrival: You must be in the facility at least 30 minutes prior to your scheduled procedure. . Children: Do not bring children with you. . Dress appropriately: Bring dark clothing that you would not mind if they get stained. . Valuables: Do not bring any jewelry or valuables. Procedure appointments are reserved for interventional treatments only. Marland Kitchen No Prescription Refills. . No medication changes will be discussed during procedure appointments. . No disability issues will be discussed. ____________________________________________________________________________________________

## 2017-04-19 NOTE — Progress Notes (Signed)
Nursing Pain Medication Assessment:  Safety precautions to be maintained throughout the outpatient stay will include: orient to surroundings, keep bed in low position, maintain call bell within reach at all times, provide assistance with transfer out of bed and ambulation.  Medication Inspection Compliance: Pill count conducted under aseptic conditions, in front of the patient. Neither the pills nor the bottle was removed from the patient's sight at any time. Once count was completed pills were immediately returned to the patient in their original bottle.  Medication: Oxycodone IR Pill/Patch Count: 84 of 90 pills remain Pill/Patch Appearance: Markings consistent with prescribed medication Bottle Appearance: Standard pharmacy container. Clearly labeled. Filled Date: 07/20/ 2018 Last Medication intake:  Wilburn Mylar
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
## 2017-04-23 LAB — 25-HYDROXY VITAMIN D LCMS D2+D3
25-Hydroxy, Vitamin D-2: <1 ng/mL
25-Hydroxy, Vitamin D-3: 54 ng/mL
25-Hydroxy, Vitamin D: 55 ng/mL

## 2017-04-23 LAB — COMPREHENSIVE METABOLIC PANEL
ALBUMIN: 5.1 g/dL (ref 3.5–5.5)
ALK PHOS: 109 IU/L (ref 39–117)
ALT: 18 IU/L (ref 0–44)
AST: 20 IU/L (ref 0–40)
Albumin/Globulin Ratio: 2.2 (ref 1.2–2.2)
BUN / CREAT RATIO: 11 (ref 9–20)
BUN: 9 mg/dL (ref 6–24)
Bilirubin Total: <0.2 mg/dL (ref 0.0–1.2)
CALCIUM: 10.5 mg/dL — AB (ref 8.7–10.2)
CO2: 21 mmol/L (ref 20–29)
CREATININE: 0.82 mg/dL (ref 0.76–1.27)
Chloride: 101 mmol/L (ref 96–106)
GFR calc Af Amer: 113 mL/min/{1.73_m2} (ref >59)
GFR calc non Af Amer: 97 mL/min/{1.73_m2} (ref >59)
GLUCOSE: 81 mg/dL (ref 65–99)
Globulin, Total: 2.3 g/dL (ref 1.5–4.5)
Potassium: 4.7 mmol/L (ref 3.5–5.2)
Sodium: 140 mmol/L (ref 134–144)
TOTAL PROTEIN: 7.4 g/dL (ref 6.0–8.5)

## 2017-04-23 LAB — SEDIMENTATION RATE: SED RATE: 2 mm/h (ref 0–30)

## 2017-04-23 LAB — VITAMIN B12: Vitamin B-12: 1147 pg/mL (ref 232–1245)

## 2017-04-23 LAB — C-REACTIVE PROTEIN: CRP: 1.5 mg/L (ref 0.0–4.9)

## 2017-04-23 LAB — MAGNESIUM: MAGNESIUM: 2.3 mg/dL (ref 1.6–2.3)

## 2017-05-18 ENCOUNTER — Ambulatory Visit
Admission: RE | Admit: 2017-05-18 | Discharge: 2017-05-18 | Disposition: A | Discharge status: Home / Self Care | Payer: Medicare Other | Source: Ambulatory Visit | Attending: Pain Medicine | Admitting: Pain Medicine

## 2017-05-18 ENCOUNTER — Encounter: Payer: Self-pay | Admitting: Pain Medicine

## 2017-05-18 ENCOUNTER — Ambulatory Visit (HOSPITAL_BASED_OUTPATIENT_CLINIC_OR_DEPARTMENT_OTHER): Payer: Medicare Other | Admitting: Pain Medicine

## 2017-05-18 VITALS — BP 130/82 | HR 85 | Temp 97.7°F | Resp 15 | Ht 68.0 in | Wt 153.0 lb

## 2017-05-18 DIAGNOSIS — M5382 Other specified dorsopathies, cervical region: Secondary | ICD-10-CM | POA: Diagnosis not present

## 2017-05-18 DIAGNOSIS — M47812 Spondylosis without myelopathy or radiculopathy, cervical region: Secondary | ICD-10-CM

## 2017-05-18 DIAGNOSIS — M4692 Unspecified inflammatory spondylopathy, cervical region: Secondary | ICD-10-CM | POA: Diagnosis present

## 2017-05-18 DIAGNOSIS — M5481 Occipital neuralgia: Secondary | ICD-10-CM | POA: Diagnosis not present

## 2017-05-18 DIAGNOSIS — G4486 Cervicogenic headache: Secondary | ICD-10-CM

## 2017-05-18 DIAGNOSIS — R51 Headache: Secondary | ICD-10-CM

## 2017-05-18 MED ORDER — DEXAMETHASONE SODIUM PHOSPHATE 10 MG/ML IJ SOLN
10.0000 mg | Freq: Once | INTRAMUSCULAR | Status: AC
  Administered 2017-05-18: 10 mg

## 2017-05-18 MED ORDER — LACTATED RINGERS IV SOLN
1000.0000 mL | Freq: Once | INTRAVENOUS | Status: AC
  Administered 2017-05-18: 1000 mL via INTRAVENOUS

## 2017-05-18 MED ORDER — FENTANYL CITRATE (PF) 100 MCG/2ML IJ SOLN
25.0000 ug | INTRAMUSCULAR | Status: DC | PRN
Start: 2017-05-18 — End: 2017-05-18
  Administered 2017-05-18: 100 ug via INTRAVENOUS

## 2017-05-18 MED ORDER — MIDAZOLAM HCL 5 MG/5ML IJ SOLN
1.0000 mg | INTRAMUSCULAR | Status: DC | PRN
  Administered 2017-05-18: 5 mg via INTRAVENOUS

## 2017-05-18 MED ORDER — MIDAZOLAM HCL 5 MG/5ML IJ SOLN
INTRAMUSCULAR | Status: AC
  Filled 2017-05-18: qty 5

## 2017-05-18 MED ORDER — ROPIVACAINE HCL 2 MG/ML IJ SOLN
9.0000 mL | Freq: Once | INTRAMUSCULAR | Status: AC
  Administered 2017-05-18: 10 mL via PERINEURAL

## 2017-05-18 MED ORDER — ROPIVACAINE HCL 2 MG/ML IJ SOLN
INTRAMUSCULAR | Status: AC
  Filled 2017-05-18: qty 20

## 2017-05-18 MED ORDER — DEXAMETHASONE SODIUM PHOSPHATE 10 MG/ML IJ SOLN
INTRAMUSCULAR | Status: AC
  Filled 2017-05-18: qty 2

## 2017-05-18 MED ORDER — LIDOCAINE HCL 2 % IJ SOLN
10.0000 mL | Freq: Once | INTRAMUSCULAR | Status: AC
  Administered 2017-05-18: 400 mg

## 2017-05-18 MED ORDER — FENTANYL CITRATE (PF) 100 MCG/2ML IJ SOLN
INTRAMUSCULAR | Status: AC
  Filled 2017-05-18: qty 2

## 2017-05-18 NOTE — Progress Notes (Signed)
Patient's Name: Mark Hodges  MRN: [id number]  Referring Provider: Tracie Harrier, MD  DOB: May 20, 1956  PCP: Tracie Harrier, MD  DOS: 05/18/2017  Note by: Gaspar Cola, MD  Service setting: Ambulatory outpatient  Specialty: Interventional Pain Management  Patient type: Established  Location: [facility] (AMB) Pain Management Facility  Visit type: Interventional Procedure   Primary Reason for Visit: Interventional Pain Management Treatment. CC: Neck Pain  Procedure:  Anesthesia, Analgesia, Anxiolysis:  Type: Diagnostic Cervical Facet Medial Branch Block(s) Region: Posterolateral cervical spine region Level: C3, C4, C5, C6, & C7 Medial Branch Level(s) Laterality: Bilateral Paraspinal  Type: Local Anesthesia with Moderate (Conscious) Sedation Local Anesthetic: Lidocaine 1% Route: Intravenous (IV) IV Access: Secured Sedation: Meaningful verbal contact was maintained at all times during the procedure  Indication(s): Analgesia and Anxiety  Indications: 1. Cervical facet syndrome (HCC) (Bilateral)   2. Spondylosis of cervical region without myelopathy or radiculopathy   3. Cervicogenic headache   4. Cervico-occipital neuralgia (Bilateral)    Pain Score: Pre-procedure: 6 /10 Post-procedure: 0-No pain/10  Pre-op Assessment:  Mark Hodges is a 61 y.o. (year old), male patient, seen today for interventional treatment. He  has a past surgical history that includes Tonsillectomy; Colonoscopy with propofol (N/A, 01/26/2016); and Esophagogastroduodenoscopy (egd) with propofol (N/A, 01/26/2016). Mark Hodges has a current medication list which includes the following prescription(s): alprostadil, aspirin, cyanocobalamin, diazepam, duloxetine, gabapentin, lamotrigine, nortriptyline, nortriptyline, oxycodone hcl, oxycodone hcl, oxycodone hcl, ropinirole, sertraline, tamsulosin, venlafaxine hcl, atorvastatin, and prednisone, and the following Facility-Administered Medications: fentanyl, lactated ringers, and  midazolam. His primarily concern today is the Neck Pain  Initial Vital Signs: Temperature 97.7 F (36.5 C), height 5\' 8"  (1.727 m), weight 153 lb (69.4 kg). BMI: Estimated body mass index is 23.26 kg/m as calculated from the following:   Height as of this encounter: 5\' 8"  (1.727 m).   Weight as of this encounter: 153 lb (69.4 kg).  Risk Assessment: Allergies: Reviewed. He has No Known Allergies.  Allergy Precautions: None required Coagulopathies: Reviewed. None identified.  Blood-thinner therapy: None at this time Active Infection(s): Reviewed. None identified. Mark Hodges is afebrile  Site Confirmation: Mark Hodges was asked to confirm the procedure and laterality before marking the site Procedure checklist: Completed Consent: Before the procedure and under the influence of no sedative(s), amnesic(s), or anxiolytics, the patient was informed of the treatment options, risks and possible complications. To fulfill our ethical and legal obligations, as recommended by the American Medical Association's Code of Ethics, I have informed the patient of my clinical impression; the nature and purpose of the treatment or procedure; the risks, benefits, and possible complications of the intervention; the alternatives, including doing nothing; the risk(s) and benefit(s) of the alternative treatment(s) or procedure(s); and the risk(s) and benefit(s) of doing nothing. The patient was provided information about the general risks and possible complications associated with the procedure. These may include, but are not limited to: failure to achieve desired goals, infection, bleeding, organ or nerve damage, allergic reactions, paralysis, and death. In addition, the patient was informed of those risks and complications associated to Spine-related procedures, such as failure to decrease pain; infection (i.e.: Meningitis, epidural or intraspinal abscess); bleeding (i.e.: epidural hematoma, subarachnoid hemorrhage, or  any other type of intraspinal or peri-dural bleeding); organ or nerve damage (i.e.: Any type of peripheral nerve, nerve root, or spinal cord injury) with subsequent damage to sensory, motor, and/or autonomic systems, resulting in permanent pain, numbness, and/or weakness of one or several areas of the body;  allergic reactions; (i.e.: anaphylactic reaction); and/or death. Furthermore, the patient was informed of those risks and complications associated with the medications. These include, but are not limited to: allergic reactions (i.e.: anaphylactic or anaphylactoid reaction(s)); adrenal axis suppression; blood sugar elevation that in diabetics may result in ketoacidosis or comma; water retention that in patients with history of congestive heart failure may result in shortness of breath, pulmonary edema, and decompensation with resultant heart failure; weight gain; swelling or edema; medication-induced neural toxicity; particulate matter embolism and blood vessel occlusion with resultant organ, and/or nervous system infarction; and/or aseptic necrosis of one or more joints. Finally, the patient was informed that Medicine is not an exact science; therefore, there is also the possibility of unforeseen or unpredictable risks and/or possible complications that may result in a catastrophic outcome. The patient indicated having understood very clearly. We have given the patient no guarantees and we have made no promises. Enough time was given to the patient to ask questions, all of which were answered to the patient's satisfaction. Mark Hodges has indicated that he wanted to continue with the procedure. Attestation: I, the ordering provider, attest that I have discussed with the patient the benefits, risks, side-effects, alternatives, likelihood of achieving goals, and potential problems during recovery for the procedure that I have provided informed consent. Date: 05/18/2017; Time: 10:35 AM  Pre-Procedure  Preparation:  Monitoring: As per clinic protocol. Respiration, ETCO2, SpO2, BP, heart rate and rhythm monitor placed and checked for adequate function Safety Precautions: Patient was assessed for positional comfort and pressure points before starting the procedure. Time-out: I initiated and conducted the "Time-out" before starting the procedure, as per protocol. The patient was asked to participate by confirming the accuracy of the "Time Out" information. Verification of the correct person, site, and procedure were performed and confirmed by me, the nursing staff, and the patient. "Time-out" conducted as per Joint Commission's Universal Protocol (UP.01.01.01). "Time-out" Date & Time: 05/18/2017; 1136 hrs.  Description of Procedure Process:   Position: Prone with head of the table was raised to facilitate breathing. Target Area: For Cervical Facet blocks, the target is the postero-lateral waist of the articular pillars at the C3, C4, C5, C6, & C7 levels. Approach: Posterior approach. Area Prepped: Entire Posterior Cervico-thoracic Region Prepping solution: ChloraPrep (2% chlorhexidine gluconate and 70% isopropyl alcohol) Safety Precautions: Aspiration looking for blood return was conducted prior to all injections. At no point did we inject any substances, as a needle was being advanced. No attempts were made at seeking any paresthesias. Safe injection practices and needle disposal techniques used. Medications properly checked for expiration dates. SDV (single dose vial) medications used. Description of the Procedure: Protocol guidelines were followed. The patient was placed in position over the fluoroscopy table. The target area was identified and the area prepped in the usual manner. Skin desensitized using vapocoolant spray. Skin & deeper tissues infiltrated with local anesthetic. Appropriate amount of time allowed to pass for local anesthetics to take effect. The procedure needle was introduced through  the skin, ipsilateral to the reported pain, and advanced to the target area. Bone was contacted on the posterior aspect of the articular pillars and the needle walked lateral, until the border was cleared. Lateral views taken to make sure the needle tip did not advance past the posterior third of the lateral mass of the posterior columns. The procedure was repeated in identical fashion for each level. Negative aspiration confirmed. Solution injected in intermittent fashion, asking for systemic symptoms every 0.5cc of  injectate. The needles were then removed and the area cleansed, making sure to leave some of the prepping solution back to take advantage of its long term bactericidal properties. Vitals:   05/18/17 1148 05/18/17 1156 05/18/17 1206 05/18/17 1216  BP: 127/83 124/70 113/88 130/82  Pulse: 85     Resp: 15 10 16 15   Temp:      SpO2: 100% 98% 98% 96%  Weight:      Height:        Start Time: 1136 hrs. End Time: 1137 hrs. Materials:  Needle(s) Type: Regular needle Gauge: 22G Length: 3.5-in Medication(s): We administered lactated ringers, midazolam, fentaNYL, lidocaine, dexamethasone, dexamethasone, ropivacaine (PF) 2 mg/mL (0.2%), and ropivacaine (PF) 2 mg/mL (0.2%). Please see chart orders for dosing details.  Imaging Guidance (Spinal):  Type of Imaging Technique: Fluoroscopy Guidance (Spinal) Indication(s): Assistance in needle guidance and placement for procedures requiring needle placement in or near specific anatomical locations not easily accessible without such assistance. Exposure Time: Please see nurses notes. Contrast: None used. Fluoroscopic Guidance: I was personally present during the use of fluoroscopy. "Tunnel Vision Technique" used to obtain the best possible view of the target area. Parallax error corrected before commencing the procedure. "Direction-depth-direction" technique used to introduce the needle under continuous pulsed fluoroscopy. Once target was reached,  antero-posterior, oblique, and lateral fluoroscopic projection used confirm needle placement in all planes. Images permanently stored in EMR. Interpretation: No contrast injected. I personally interpreted the imaging intraoperatively. Adequate needle placement confirmed in multiple planes. Permanent images saved into the patient's record.  Antibiotic Prophylaxis:  Indication(s): None identified Antibiotic given: None  Post-operative Assessment:  EBL: None Complications: No immediate post-treatment complications observed by team, or reported by patient. Note: The patient tolerated the entire procedure well. A repeat set of vitals were taken after the procedure and the patient was kept under observation following institutional policy, for this type of procedure. Post-procedural neurological assessment was performed, showing return to baseline, prior to discharge. The patient was provided with post-procedure discharge instructions, including a section on how to identify potential problems. Should any problems arise concerning this procedure, the patient was given instructions to immediately contact us, at any time, without hesitation. In any case, we plan to contact the patient by telephone for a follow-up status report regarding this interventional procedure. Comments:  No additional relevant information.  Plan of Care  Disposition: Discharge home  Discharge Date & Time: 05/18/2017; 1218 hrs.  Physician-requested Follow-up:  Return for post-procedure eval by Dr. Dossie Arbour in 2 weeks.  Future Appointments Date Time Provider [place]  06/21/2017 1:45 PM Milinda Pointer, MD [facility] None  08/09/2017 8:30 AM Vevelyn Francois, NP [facility] None  08/22/2017 11:15 AM [facility] None  08/25/2017 11:15 AM Hollice Espy, MD [facility] None    Imaging Orders     DG C-Arm 1-60 Min-No Report  Procedure Orders     CERVICAL FACET (MEDIAL BRANCH NERVE BLOCK)   Medications ordered for  procedure: Meds ordered this encounter  Medications  . lactated ringers infusion 1,000 mL  . midazolam (VERSED) 5 MG/5ML injection 1-2 mg    Make sure Flumazenil is available in the pyxis when using this medication. If oversedation occurs, administer 0.2 mg IV over 15 sec. If after 45 sec no response, administer 0.2 mg again over 1 min; may repeat at 1 min intervals; not to exceed 4 doses (1 mg)  . fentaNYL (SUBLIMAZE) injection 25-50 mcg    Make sure Narcan is available in the  pyxis when using this medication. In the event of respiratory depression (RR< 8/min): Titrate NARCAN (naloxone) in increments of 0.1 to 0.2 mg IV at 2-3 minute intervals, until desired degree of reversal.  . lidocaine (XYLOCAINE) 2 % (with pres) injection 200 mg  . dexamethasone (DECADRON) injection 10 mg  . dexamethasone (DECADRON) injection 10 mg  . ropivacaine (PF) 2 mg/mL (0.2%) (NAROPIN) injection 9 mL  . ropivacaine (PF) 2 mg/mL (0.2%) (NAROPIN) injection 9 mL   Medications administered: We administered lactated ringers, midazolam, fentaNYL, lidocaine, dexamethasone, dexamethasone, ropivacaine (PF) 2 mg/mL (0.2%), and ropivacaine (PF) 2 mg/mL (0.2%).  See the medical record for exact dosing, route, and time of administration.  New Prescriptions   No medications on file   Primary Care Physician: Tracie Harrier, MD Location: [facility] Note by: Gaspar Cola, MD Date: 05/18/2017; Time: 12:25 PM  Disclaimer:  Medicine is not an exact science. The only guarantee in medicine is that nothing is guaranteed. It is important to note that the decision to proceed with this intervention was based on the information collected from the patient. The Data and conclusions were drawn from the patient's questionnaire, the interview, and the physical examination. Because the information was provided in large part by the patient, it cannot be guaranteed that it has not been purposely or  unconsciously manipulated. Every effort has been made to obtain as much relevant data as possible for this evaluation. It is important to note that the conclusions that lead to this procedure are derived in large part from the available data. Always take into account that the treatment will also be dependent on availability of resources and existing treatment guidelines, considered by other Pain Management Practitioners as being common knowledge and practice, at the time of the intervention. For Medico-Legal purposes, it is also important to point out that variation in procedural techniques and pharmacological choices are the acceptable norm. The indications, contraindications, technique, and results of the above procedure should only be interpreted and judged by a Board-Certified Interventional Pain Specialist with extensive familiarity and expertise in the same exact procedure and technique.

## 2017-05-18 NOTE — Progress Notes (Signed)
Safety precautions to be maintained throughout the outpatient stay will include: orient to surroundings, keep bed in low position, maintain call bell within reach at all times, provide assistance with transfer out of bed and ambulation.  

## 2017-05-18 NOTE — Patient Instructions (Addendum)
____________________________________________________________________________________________  Post-Procedure instructions Instructions:  Apply ice: Fill a plastic sandwich bag with crushed ice. Cover it with a small towel and apply to injection site. Apply for 15 minutes then remove x 15 minutes. Repeat sequence on day of procedure, until you go to bed. The purpose is to minimize swelling and discomfort after procedure.  Apply heat: Apply heat to procedure site starting the day following the procedure. The purpose is to treat any soreness and discomfort from the procedure.  Food intake: Start with clear liquids (like water) and advance to regular food, as tolerated.   Physical activities: Keep activities to a minimum for the first 8 hours after the procedure.   Driving: If you have received any sedation, you are not allowed to drive for 24 hours after your procedure.  Blood thinner: Restart your blood thinner 6 hours after your procedure. (Only for those taking blood thinners)  Insulin: As soon as you can eat, you may resume your normal dosing schedule. (Only for those taking insulin)  Infection prevention: Keep procedure site clean and dry.  Post-procedure Pain Diary: Extremely important that this be done correctly and accurately. Recorded information will be used to determine the next step in treatment.  Pain evaluated is that of treated area only. Do not include pain from an untreated area.  Complete every hour, on the hour, for the initial 8 hours. Set an alarm to help you do this part accurately.  Do not go to sleep and have it completed later. It will not be accurate.  Follow-up appointment: Keep your follow-up appointment after the procedure. Usually 2 weeks for most procedures. (6 weeks in the case of radiofrequency.) Bring you pain diary.  Expect:  From numbing medicine (AKA: Local Anesthetics): Numbness or decrease in pain.  Onset: Full effect within 15 minutes of  injected.  Duration: It will depend on the type of local anesthetic used. On the average, 1 to 8 hours.   From steroids: Decrease in swelling or inflammation. Once inflammation is improved, relief of the pain will follow.  Onset of benefits: Depends on the amount of swelling present. The more swelling, the longer it will take for the benefits to be seen. In some cases, up to 10 days.  Duration: Steroids will stay in the system x 2 weeks. Duration of benefits will depend on multiple posibilities including persistent irritating factors.  From procedure: Some discomfort is to be expected once the numbing medicine wears off. This should be minimal if ice and heat are applied as instructed. Call if:  You experience numbness and weakness that gets worse with time, as opposed to wearing off.  New onset bowel or bladder incontinence. (Spinal procedures only)  Emergency Numbers:  Durning business hours (Monday - Thursday, 8:00 AM - 4:00 PM) (Friday, 9:00 AM - 12:00 Noon): [phone]  After hours: [phone] ____________________________________________________________________________________________  Pain Management Discharge Instructions  General Discharge Instructions :  If you need to reach your doctor call: Monday-Friday 8:00 am - 4:00 pm at [phone] or toll free [phone].  After clinic hours [phone] to have operator reach doctor.  Bring all of your medication bottles to all your appointments in the pain clinic.  To cancel or reschedule your appointment with Pain Management please remember to call 24 hours in advance to avoid a fee.  Refer to the educational materials which you have been given on: General Risks, I had my Procedure. Discharge Instructions, Post Sedation.  Post Procedure Instructions:  The drugs you   were given will stay in your system until tomorrow, so for the next 24 hours you should not drive, make any legal decisions or drink any alcoholic  beverages.  You may eat anything you prefer, but it is better to start with liquids then soups and crackers, and gradually work up to solid foods.  Please notify your doctor immediately if you have any unusual bleeding, trouble breathing or pain that is not related to your normal pain.  Depending on the type of procedure that was done, some parts of your body may feel week and/or numb.  This usually clears up by tonight or the next day.  Walk with the use of an assistive device or accompanied by an adult for the 24 hours.  You may use ice on the affected area for the first 24 hours.  Put ice in a Ziploc bag and cover with a towel and place against area 15 minutes on 15 minutes off.  You may switch to heat after 24 hours. 

## 2017-05-19 ENCOUNTER — Telehealth: Payer: Self-pay

## 2017-05-19 NOTE — Telephone Encounter (Signed)
Post procedure phone call.  Left message.  
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
## 2017-06-21 ENCOUNTER — Ambulatory Visit: Payer: Medicare Other | Attending: Pain Medicine | Admitting: Pain Medicine

## 2017-06-21 ENCOUNTER — Encounter: Payer: Self-pay | Admitting: Pain Medicine

## 2017-06-21 VITALS — Ht 68.0 in | Wt 153.0 lb

## 2017-06-21 DIAGNOSIS — E785 Hyperlipidemia, unspecified: Secondary | ICD-10-CM | POA: Diagnosis not present

## 2017-06-21 DIAGNOSIS — Z720 Tobacco use: Secondary | ICD-10-CM | POA: Insufficient documentation

## 2017-06-21 DIAGNOSIS — M47812 Spondylosis without myelopathy or radiculopathy, cervical region: Secondary | ICD-10-CM

## 2017-06-21 DIAGNOSIS — F411 Generalized anxiety disorder: Secondary | ICD-10-CM | POA: Insufficient documentation

## 2017-06-21 DIAGNOSIS — M1611 Unilateral primary osteoarthritis, right hip: Secondary | ICD-10-CM | POA: Diagnosis not present

## 2017-06-21 DIAGNOSIS — Z79899 Other long term (current) drug therapy: Secondary | ICD-10-CM | POA: Insufficient documentation

## 2017-06-21 DIAGNOSIS — F329 Major depressive disorder, single episode, unspecified: Secondary | ICD-10-CM | POA: Insufficient documentation

## 2017-06-21 DIAGNOSIS — M48061 Spinal stenosis, lumbar region without neurogenic claudication: Secondary | ICD-10-CM | POA: Diagnosis not present

## 2017-06-21 DIAGNOSIS — M47896 Other spondylosis, lumbar region: Secondary | ICD-10-CM | POA: Diagnosis not present

## 2017-06-21 DIAGNOSIS — Z5181 Encounter for therapeutic drug level monitoring: Secondary | ICD-10-CM | POA: Diagnosis not present

## 2017-06-21 DIAGNOSIS — M545 Low back pain, unspecified: Secondary | ICD-10-CM

## 2017-06-21 DIAGNOSIS — Z8546 Personal history of malignant neoplasm of prostate: Secondary | ICD-10-CM | POA: Insufficient documentation

## 2017-06-21 DIAGNOSIS — M5416 Radiculopathy, lumbar region: Secondary | ICD-10-CM | POA: Insufficient documentation

## 2017-06-21 DIAGNOSIS — Z79891 Long term (current) use of opiate analgesic: Secondary | ICD-10-CM | POA: Diagnosis not present

## 2017-06-21 DIAGNOSIS — J449 Chronic obstructive pulmonary disease, unspecified: Secondary | ICD-10-CM | POA: Diagnosis not present

## 2017-06-21 DIAGNOSIS — R51 Headache: Secondary | ICD-10-CM | POA: Insufficient documentation

## 2017-06-21 DIAGNOSIS — N400 Enlarged prostate without lower urinary tract symptoms: Secondary | ICD-10-CM | POA: Insufficient documentation

## 2017-06-21 DIAGNOSIS — M25551 Pain in right hip: Secondary | ICD-10-CM | POA: Insufficient documentation

## 2017-06-21 DIAGNOSIS — M4692 Unspecified inflammatory spondylopathy, cervical region: Secondary | ICD-10-CM

## 2017-06-21 DIAGNOSIS — G4486 Cervicogenic headache: Secondary | ICD-10-CM

## 2017-06-21 DIAGNOSIS — G8929 Other chronic pain: Secondary | ICD-10-CM

## 2017-06-21 DIAGNOSIS — N529 Male erectile dysfunction, unspecified: Secondary | ICD-10-CM | POA: Diagnosis not present

## 2017-06-21 DIAGNOSIS — M542 Cervicalgia: Secondary | ICD-10-CM | POA: Diagnosis not present

## 2017-06-21 DIAGNOSIS — M47892 Other spondylosis, cervical region: Secondary | ICD-10-CM | POA: Insufficient documentation

## 2017-06-21 DIAGNOSIS — E7800 Pure hypercholesterolemia, unspecified: Secondary | ICD-10-CM | POA: Diagnosis not present

## 2017-06-21 DIAGNOSIS — Z8673 Personal history of transient ischemic attack (TIA), and cerebral infarction without residual deficits: Secondary | ICD-10-CM | POA: Diagnosis not present

## 2017-06-21 DIAGNOSIS — Z7982 Long term (current) use of aspirin: Secondary | ICD-10-CM | POA: Insufficient documentation

## 2017-06-21 DIAGNOSIS — G894 Chronic pain syndrome: Secondary | ICD-10-CM | POA: Diagnosis not present

## 2017-06-21 NOTE — Patient Instructions (Addendum)
____________________________________________________________________________________________  Pain Scale  Introduction: The pain score used by this practice is the Verbal Numerical Rating Scale (VNRS-11). This is an 11-point scale. It is for adults and children 10 years or older. There are significant differences in how the pain score is reported, used, and applied. Forget everything you learned in the past and learn this scoring system.  General Information: The scale should reflect your current level of pain. Unless you are specifically asked for the level of your worst pain, or your average pain. If you are asked for one of these two, then it should be understood that it is over the past 24 hours.  Basic Activities of Daily Living (ADL): Personal hygiene, dressing, eating, transferring, and using restroom.  Instructions: Most patients tend to report their level of pain as a combination of two factors, their physical pain and their psychosocial pain. This last one is also known as "suffering" and it is reflection of how physical pain affects you socially and psychologically. From now on, report them separately. From this point on, when asked to report your pain level, report only your physical pain. Use the following table for reference.  Pain Clinic Pain Levels (0-5/10)  Pain Level Score  Description  No Pain 0   Mild pain 1 Nagging, annoying, but does not interfere with basic activities of daily living (ADL). Patients are able to eat, bathe, get dressed, toileting (being able to get on and off the toilet and perform personal hygiene functions), transfer (move in and out of bed or a chair without assistance), and maintain continence (able to control bladder and bowel functions). Blood pressure and heart rate are unaffected. A normal heart rate for a healthy adult ranges from 60 to 100 bpm (beats per minute).   Mild to moderate pain 2 Noticeable and distracting. Impossible to hide from other  people. More frequent flare-ups. Still possible to adapt and function close to normal. It can be very annoying and may have occasional stronger flare-ups. With discipline, patients may get used to it and adapt.   Moderate pain 3 Interferes significantly with activities of daily living (ADL). It becomes difficult to feed, bathe, get dressed, get on and off the toilet or to perform personal hygiene functions. Difficult to get in and out of bed or a chair without assistance. Very distracting. With effort, it can be ignored when deeply involved in activities.   Moderately severe pain 4 Impossible to ignore for more than a few minutes. With effort, patients may still be able to manage work or participate in some social activities. Very difficult to concentrate. Signs of autonomic nervous system discharge are evident: dilated pupils (mydriasis); mild sweating (diaphoresis); sleep interference. Heart rate becomes elevated (>115 bpm). Diastolic blood pressure (lower number) rises above 100 mmHg. Patients find relief in laying down and not moving.   Severe pain 5 Intense and extremely unpleasant. Associated with frowning face and frequent crying. Pain overwhelms the senses.  Ability to do any activity or maintain social relationships becomes significantly limited. Conversation becomes difficult. Pacing back and forth is common, as getting into a comfortable position is nearly impossible. Pain wakes you up from deep sleep. Physical signs will be obvious: pupillary dilation; increased sweating; goosebumps; brisk reflexes; cold, clammy hands and feet; nausea, vomiting or dry heaves; loss of appetite; significant sleep disturbance with inability to fall asleep or to remain asleep. When persistent, significant weight loss is observed due to the complete loss of appetite and sleep deprivation.  Blood   pressure and heart rate becomes significantly elevated. Caution: If elevated blood pressure triggers a pounding headache  associated with blurred vision, then the patient should immediately seek attention at an urgent or emergency care unit, as these may be signs of an impending stroke.    Emergency Department Pain Levels (6-10/10)  Emergency Room Pain 6 Severely limiting. Requires emergency care and should not be seen or managed at an outpatient pain management facility. Communication becomes difficult and requires great effort. Assistance to reach the emergency department may be required. Facial flushing and profuse sweating along with potentially dangerous increases in heart rate and blood pressure will be evident.   Distressing pain 7 Self-care is very difficult. Assistance is required to transport, or use restroom. Assistance to reach the emergency department will be required. Tasks requiring coordination, such as bathing and getting dressed become very difficult.   Disabling pain 8 Self-care is no longer possible. At this level, pain is disabling. The individual is unable to do even the most "basic" activities such as walking, eating, bathing, dressing, transferring to a bed, or toileting. Fine motor skills are lost. It is difficult to think clearly.   Incapacitating pain 9 Pain becomes incapacitating. Thought processing is no longer possible. Difficult to remember your own name. Control of movement and coordination are lost.   The worst pain imaginable 10 At this level, most patients pass out from pain. When this level is reached, collapse of the autonomic nervous system occurs, leading to a sudden drop in blood pressure and heart rate. This in turn results in a temporary and dramatic drop in blood flow to the brain, leading to a loss of consciousness. Fainting is one of the body's self defense mechanisms. Passing out puts the brain in a calmed state and causes it to shut down for a while, in order to begin the healing process.    Summary: 1. Refer to this scale when providing [place] with your pain level. 2. Be  accurate and careful when reporting your pain level. This will help with your care. 3. Over-reporting your pain level will lead to loss of credibility. 4. Even a level of 1/10 means that there is pain and will be treated at our facility. 5. High, inaccurate reporting will be documented as "Symptom Exaggeration", leading to loss of credibility and suspicions of possible secondary gains such as obtaining more narcotics, or wanting to appear disabled, for fraudulent reasons. 6. Only pain levels of 5 or below will be seen at our facility. 7. Pain levels of 6 and above will be sent to the Emergency Department and the appointment cancelled. ____________________________________________________________________________________________  ____________________________________________________________________________________________  Preparing for Procedure with Sedation Instructions: . Oral Intake: Do not eat or drink anything for at least 8 hours prior to your procedure. . Transportation: Public transportation is not allowed. Bring an adult driver. The driver must be physically present in our waiting room before any procedure can be started. Marland Kitchen Physical Assistance: Bring an adult physically capable of assisting you, in the event you need help. This adult should keep you company at home for at least 6 hours after the procedure. . Blood Pressure Medicine: Take your blood pressure medicine with a sip of water the morning of the procedure. . Blood thinners:  . Diabetics on insulin: Notify the staff so that you can be scheduled 1st case in the morning. If your diabetes requires high dose insulin, take only  of your normal insulin dose the morning of the procedure and notify the  staff that you have done so. . Preventing infections: Shower with an antibacterial soap the morning of your procedure. . Build-up your immune system: Take 1000 mg of Vitamin C with every meal (3 times a day) the day prior to your  procedure. Marland Kitchen Antibiotics: Inform the staff if you have a condition or reason that requires you to take antibiotics before dental procedures. . Pregnancy: If you are pregnant, call and cancel the procedure. . Sickness: If you have a cold, fever, or any active infections, call and cancel the procedure. . Arrival: You must be in the facility at least 30 minutes prior to your scheduled procedure. . Children: Do not bring children with you. . Dress appropriately: Bring dark clothing that you would not mind if they get stained. . Valuables: Do not bring any jewelry or valuables. Procedure appointments are reserved for interventional treatments only. Marland Kitchen No Prescription Refills. . No medication changes will be discussed during procedure appointments. . No disability issues will be discussed. ____________________________________________________________________________________________  GENERAL RISKS AND COMPLICATIONS  What are the risk, side effects and possible complications? Generally speaking, most procedures are safe.  However, with any procedure there are risks, side effects, and the possibility of complications.  The risks and complications are dependent upon the sites that are lesioned, or the type of nerve block to be performed.  The closer the procedure is to the spine, the more serious the risks are.  Great care is taken when placing the radio frequency needles, block needles or lesioning probes, but sometimes complications can occur. 1. Infection: Any time there is an injection through the skin, there is a risk of infection.  This is why sterile conditions are used for these blocks.  There are four possible types of infection. 1. Localized skin infection. 2. Central Nervous System Infection-This can be in the form of Meningitis, which can be deadly. 3. Epidural Infections-This can be in the form of an epidural abscess, which can cause pressure inside of the spine, causing compression of the  spinal cord with subsequent paralysis. This would require an emergency surgery to decompress, and there are no guarantees that the patient would recover from the paralysis. 4. Discitis-This is an infection of the intervertebral discs.  It occurs in about 1% of discography procedures.  It is difficult to treat and it may lead to surgery.        2. Pain: the needles have to go through skin and soft tissues, will cause soreness.       3. Damage to internal structures:  The nerves to be lesioned may be near blood vessels or    other nerves which can be potentially damaged.       4. Bleeding: Bleeding is more common if the patient is taking blood thinners such as  aspirin, Coumadin, Ticiid, Plavix, etc., or if he/she have some genetic predisposition  such as hemophilia. Bleeding into the spinal canal can cause compression of the spinal  cord with subsequent paralysis.  This would require an emergency surgery to  decompress and there are no guarantees that the patient would recover from the  paralysis.       5. Pneumothorax:  Puncturing of a lung is a possibility, every time a needle is introduced in  the area of the chest or upper back.  Pneumothorax refers to free air around the  collapsed lung(s), inside of the thoracic cavity (chest cavity).  Another two possible  complications related to a similar event would include: Hemothorax  and Chylothorax.   These are variations of the Pneumothorax, where instead of air around the collapsed  lung(s), you may have blood or chyle, respectively.       6. Spinal headaches: They may occur with any procedures in the area of the spine.       7. Persistent CSF (Cerebro-Spinal Fluid) leakage: This is a rare problem, but may occur  with prolonged intrathecal or epidural catheters either due to the formation of a fistulous  track or a dural tear.       8. Nerve damage: By working so close to the spinal cord, there is always a possibility of  nerve damage, which could be as  serious as a permanent spinal cord injury with  paralysis.       9. Death:  Although rare, severe deadly allergic reactions known as "Anaphylactic  reaction" can occur to any of the medications used.      10. Worsening of the symptoms:  We can always make thing worse.  What are the chances of something like this happening? Chances of any of this occuring are extremely low.  By statistics, you have more of a chance of getting killed in a motor vehicle accident: while driving to the hospital than any of the above occurring .  Nevertheless, you should be aware that they are possibilities.  In general, it is similar to taking a shower.  Everybody knows that you can slip, hit your head and get killed.  Does that mean that you should not shower again?  Nevertheless always keep in mind that statistics do not mean anything if you happen to be on the wrong side of them.  Even if a procedure has a 1 (one) in a 1,000,000 (million) chance of going wrong, it you happen to be that one..Also, keep in mind that by statistics, you have more of a chance of having something go wrong when taking medications.  Who should not have this procedure? If you are on a blood thinning medication (e.g. Coumadin, Plavix, see list of "Blood Thinners"), or if you have an active infection going on, you should not have the procedure.  If you are taking any blood thinners, please inform your physician.  How should I prepare for this procedure?  Do not eat or drink anything at least six hours prior to the procedure.  Bring a driver with you .  It cannot be a taxi.  Come accompanied by an adult that can drive you back, and that is strong enough to help you if your legs get weak or numb from the local anesthetic.  Take all of your medicines the morning of the procedure with just enough water to swallow them.  If you have diabetes, make sure that you are scheduled to have your procedure done first thing in the morning, whenever  possible.  If you have diabetes, take only half of your insulin dose and notify our nurse that you have done so as soon as you arrive at the clinic.  If you are diabetic, but only take blood sugar pills (oral hypoglycemic), then do not take them on the morning of your procedure.  You may take them after you have had the procedure.  Do not take aspirin or any aspirin-containing medications, at least eleven (11) days prior to the procedure.  They may prolong bleeding.  Wear loose fitting clothing that may be easy to take off and that you would not mind if it got stained with  Betadine or blood.  Do not wear any jewelry or perfume  Remove any nail coloring.  It will interfere with some of our monitoring equipment.  NOTE: Remember that this is not meant to be interpreted as a complete list of all possible complications.  Unforeseen problems may occur.  BLOOD THINNERS The following drugs contain aspirin or other products, which can cause increased bleeding during surgery and should not be taken for 2 weeks prior to and 1 week after surgery.  If you should need take something for relief of minor pain, you may take acetaminophen which is found in Tylenol,m Datril, Anacin-3 and Panadol. It is not blood thinner. The products listed below are.  Do not take any of the products listed below in addition to any listed on your instruction sheet.  A.P.C or A.P.C with Codeine Codeine Phosphate Capsules #3 Ibuprofen Ridaura  ABC compound Congesprin Imuran rimadil  Advil Cope Indocin Robaxisal  Alka-Seltzer Effervescent Pain Reliever and Antacid Coricidin or Coricidin-D  Indomethacin Rufen  Alka-Seltzer plus Cold Medicine Cosprin Ketoprofen S-A-C Tablets  Anacin Analgesic Tablets or Capsules Coumadin Korlgesic Salflex  Anacin Extra Strength Analgesic tablets or capsules CP-2 Tablets Lanoril Salicylate  Anaprox Cuprimine Capsules Levenox Salocol  Anexsia-D Dalteparin Magan Salsalate  Anodynos Darvon compound  Magnesium Salicylate Sine-off  Ansaid Dasin Capsules Magsal Sodium Salicylate  Anturane Depen Capsules Marnal Soma  APF Arthritis pain formula Dewitt's Pills Measurin Stanback  Argesic Dia-Gesic Meclofenamic Sulfinpyrazone  Arthritis Bayer Timed Release Aspirin Diclofenac Meclomen Sulindac  Arthritis pain formula Anacin Dicumarol Medipren Supac  Analgesic (Safety coated) Arthralgen Diffunasal Mefanamic Suprofen  Arthritis Strength Bufferin Dihydrocodeine Mepro Compound Suprol  Arthropan liquid Dopirydamole Methcarbomol with Aspirin Synalgos  ASA tablets/Enseals Disalcid Micrainin Tagament  Ascriptin Doan's Midol Talwin  Ascriptin A/D Dolene Mobidin Tanderil  Ascriptin Extra Strength Dolobid Moblgesic Ticlid  Ascriptin with Codeine Doloprin or Doloprin with Codeine Momentum Tolectin  Asperbuf Duoprin Mono-gesic Trendar  Aspergum Duradyne Motrin or Motrin IB Triminicin  Aspirin plain, buffered or enteric coated Durasal Myochrisine Trigesic  Aspirin Suppositories Easprin Nalfon Trillsate  Aspirin with Codeine Ecotrin Regular or Extra Strength Naprosyn Uracel  Atromid-S Efficin Naproxen Ursinus  Auranofin Capsules Elmiron Neocylate Vanquish  Axotal Emagrin Norgesic Verin  Azathioprine Empirin or Empirin with Codeine Normiflo Vitamin E  Azolid Emprazil Nuprin Voltaren  Bayer Aspirin plain, buffered or children's or timed BC Tablets or powders Encaprin Orgaran Warfarin Sodium  Buff-a-Comp Enoxaparin Orudis Zorpin  Buff-a-Comp with Codeine Equegesic Os-Cal-Gesic   Buffaprin Excedrin plain, buffered or Extra Strength Oxalid   Bufferin Arthritis Strength Feldene Oxphenbutazone   Bufferin plain or Extra Strength Feldene Capsules Oxycodone with Aspirin   Bufferin with Codeine Fenoprofen Fenoprofen Pabalate or Pabalate-SF   Buffets II Flogesic Panagesic   Buffinol plain or Extra Strength Florinal or Florinal with Codeine Panwarfarin   Buf-Tabs Flurbiprofen Penicillamine   Butalbital Compound  Four-way cold tablets Penicillin   Butazolidin Fragmin Pepto-Bismol   Carbenicillin Geminisyn Percodan   Carna Arthritis Reliever Geopen Persantine   Carprofen Gold's salt Persistin   Chloramphenicol Goody's Phenylbutazone   Chloromycetin Haltrain Piroxlcam   Clmetidine heparin Plaquenil   Cllnoril Hyco-pap Ponstel   Clofibrate Hydroxy chloroquine Propoxyphen         Before stopping any of these medications, be sure to consult the physician who ordered them.  Some, such as Coumadin (Warfarin) are ordered to prevent or treat serious conditions such as "deep thrombosis", "pumonary embolisms", and other heart problems.  The amount of time that you may  need off of the medication may also vary with the medication and the reason for which you were taking it.  If you are taking any of these medications, please make sure you notify your pain physician before you undergo any procedures.          Facet Joint Block The facet joints connect the bones of the spine (vertebrae). They make it possible for you to bend, twist, and make other movements with your spine. They also keep you from bending too far, twisting too far, and making other excessive movements. A facet joint block is a procedure where a numbing medicine (anesthetic) is injected into a facet joint. Often, a type of anti-inflammatory medicine called a steroid is also injected. A facet joint block may be done to diagnose neck or back pain. If the pain gets better after a facet joint block, it means the pain is probably coming from the facet joint. If the pain does not get better, it means the pain is probably not coming from the facet joint. A facet joint block may also be done to relieve neck or back pain caused by an inflamed facet joint. A facet joint block is only done to relieve pain if the pain does not improve with other methods, such as medicine, exercise programs, and physical therapy. Tell a health care provider about:  Any  allergies you have.  All medicines you are taking, including vitamins, herbs, eye drops, creams, and over-the-counter medicines.  Any problems you or family members have had with anesthetic medicines.  Any blood disorders you have.  Any surgeries you have had.  Any medical conditions you have.  Whether you are pregnant or may be pregnant. What are the risks? Generally, this is a safe procedure. However, problems may occur, including:  Bleeding.  Injury to a nerve near the injection site.  Pain at the injection site.  Weakness or numbness in areas controlled by nerves near the injection site.  Infection.  Temporary fluid retention.  Allergic reactions to medicines or dyes.  Injury to other structures or organs near the injection site.  What happens before the procedure?  Follow instructions from your health care provider about eating or drinking restrictions.  Ask your health care provider about: ? Changing or stopping your regular medicines. This is especially important if you are taking diabetes medicines or blood thinners. ? Taking medicines such as aspirin and ibuprofen. These medicines can thin your blood. Do not take these medicines before your procedure if your health care provider instructs you not to.  Do not take any new dietary supplements or medicines without asking your health care provider first.  Plan to have someone take you home after the procedure. What happens during the procedure?  You may need to remove your clothing and dress in an open-back gown.  The procedure will be done while you are lying on an X-ray table. You will most likely be asked to lie on your stomach, but you may be asked to lie in a different position if an injection will be made in your neck.  Machines will be used to monitor your oxygen levels, heart rate, and blood pressure.  If an injection will be made in your neck, an IV tube will be inserted into one of your veins. Fluids  and medicine will flow directly into your body through the IV tube.  The area over the facet joint where the injection will be made will be cleaned with soap. The surrounding  skin will be covered with clean drapes.  A numbing medicine (local anesthetic) will be applied to your skin. Your skin may sting or burn for a moment.  A video X-ray machine (fluoroscopy) will be used to locate the joint. In some cases, a CT scan may be used.  A contrast dye may be injected into the facet joint area to help locate the joint.  When the joint is located, an anesthetic will be injected into the joint through the needle.  Your health care provider will ask you whether you feel pain relief. If you do feel relief, a steroid may be injected to provide pain relief for a longer period of time. If you do not feel relief or feel only partial relief, additional injections of an anesthetic may be made in other facet joints.  The needle will be removed.  Your skin will be cleaned.  A bandage (dressing) will be applied over each injection site. The procedure may vary among health care providers and hospitals. What happens after the procedure?  You will be observed for 15-30 minutes before being allowed to go home. This information is not intended to replace advice given to you by your health care provider. Make sure you discuss any questions you have with your health care provider. Document Released: 02/01/2007 Document Revised: 10/14/2015 Document Reviewed: 06/08/2015 [employer]vier Interactive Patient Education  [employer].

## 2017-06-27 IMAGING — MR MR HEAD W/O CM
10 series · 48 of 48 positions shown · non-contrast
Comparison: 05/09/2008 MR brain.

CLINICAL DATA: 59-year-old male with hyperlipidemia and prostate
cancer presenting with aphasia. Off balance for the past month. Some
tremors. Initial encounter.

EXAM:
MRI HEAD WITHOUT CONTRAST
TECHNIQUE: Multiplanar, multiecho pulse sequences of the brain and surrounding
structures were obtained without intravenous contrast.

[Series 2: T1 · sagittal · 5.0mm · 0.45mm/px · 2 of 25 slices shown (1 of 2)]
[im 1/25]
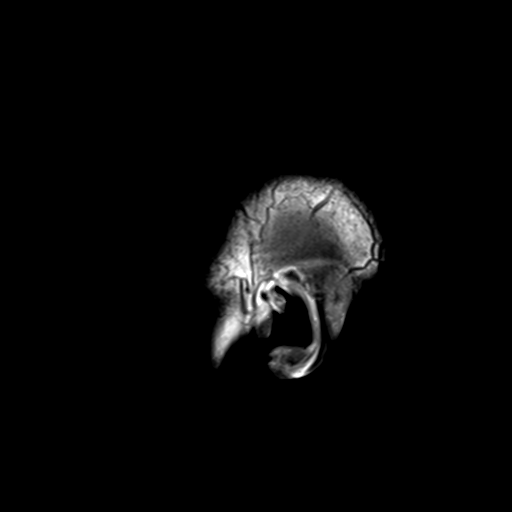
[im 25/25]
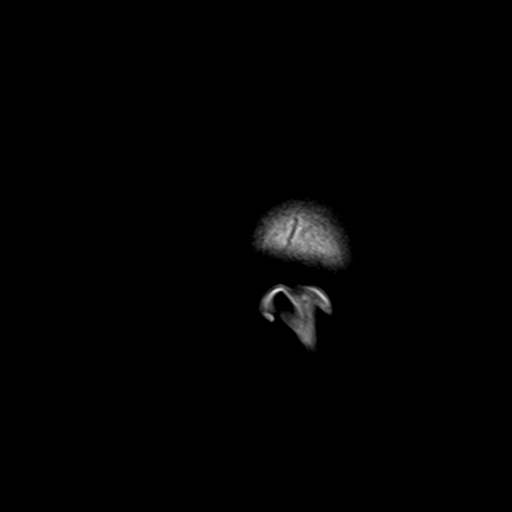

[Series 4: DWI · axial · 3.0mm · 1.20mm/px · z∈[-63,+98]mm · 7 of 55 slices shown (1 of 4)]
[im 1/55]
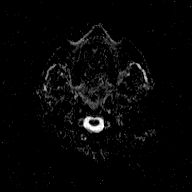
[im 10/55]
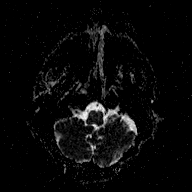
[im 19/55]
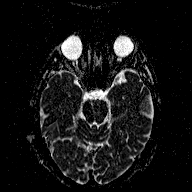
[im 28/55]
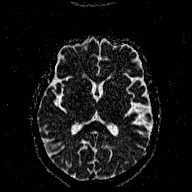
[im 37/55]
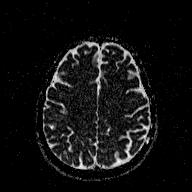
[im 46/55]
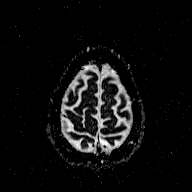
[im 55/55]
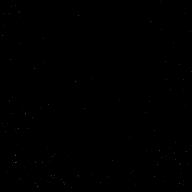

[Series 6: DWI · coronal · 3.0mm · 1.20mm/px · 6 of 47 slices shown (2 of 4)]
[im 1/47]
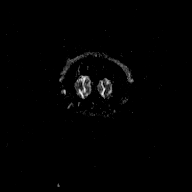
[im 10/47]
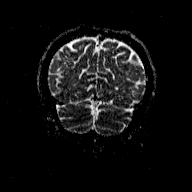
[im 19/47]
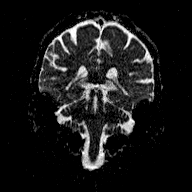
[im 28/47]
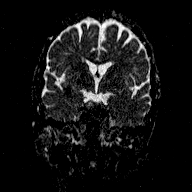
[im 37/47]
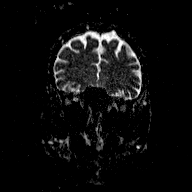
[im 47/47]
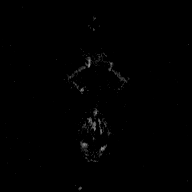

[Series 7: T2 · axial · 5.0mm · 0.72mm/px · z∈[-69,+99]mm · 3 of 27 slices shown (1 of 3)]
[im 1/27]
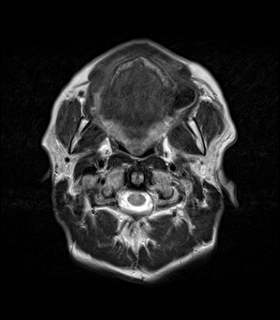
[im 14/27]
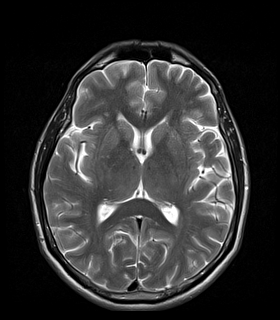
[im 27/27]
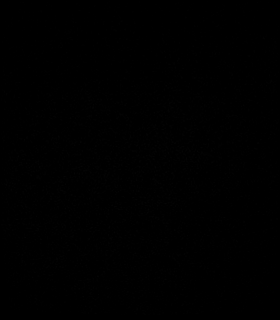

[Series 8: FLAIR · axial · 5.0mm · 0.45mm/px · z∈[-69,+99]mm · 3 of 27 slices shown]
[im 1/27]
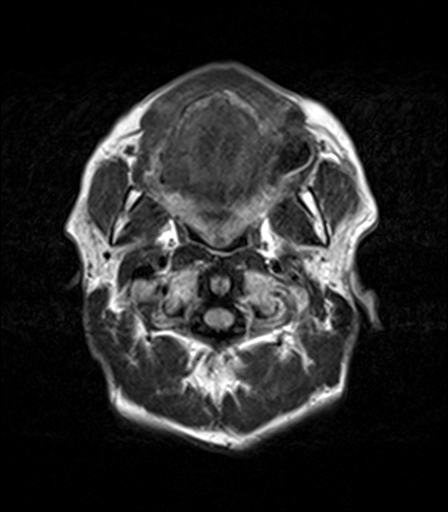
[im 14/27]
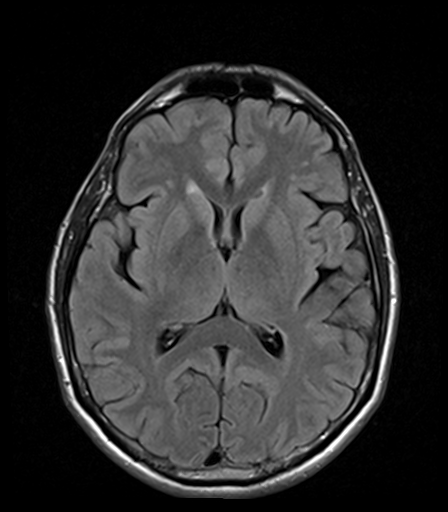
[im 27/27]
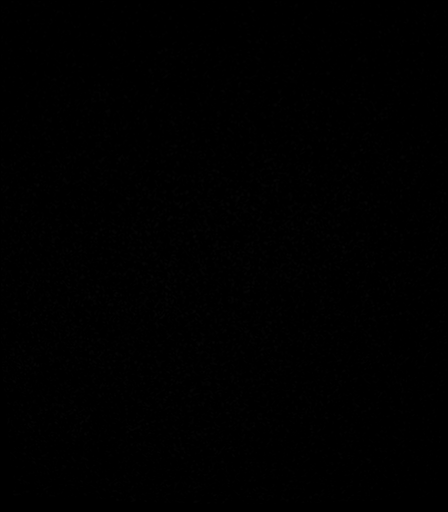

[Series 9: T2 · axial · 5.0mm · 0.72mm/px · z∈[-69,+99]mm · 3 of 27 slices shown (2 of 3)]
[im 1/27]
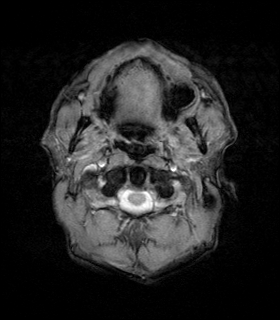
[im 14/27]
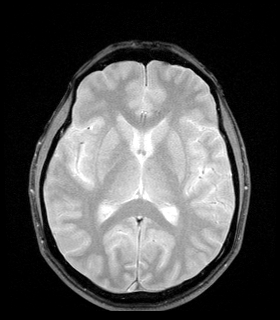
[im 27/27]
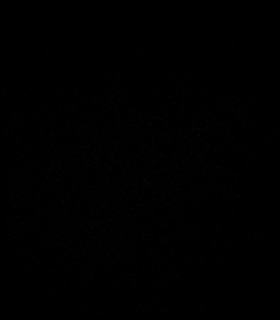

[Series 10: T1 · axial · 3.0mm · 1.00mm/px · z∈[-65,+99]mm · 7 of 56 slices shown (2 of 2)]
[im 1/56]
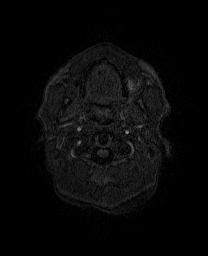
[im 10/56]
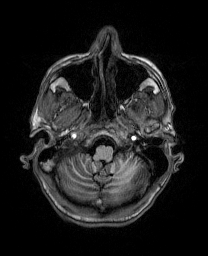
[im 19/56]
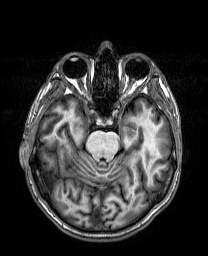
[im 28/56]
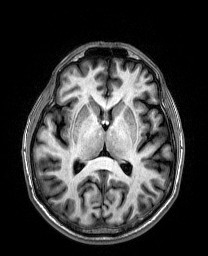
[im 37/56]
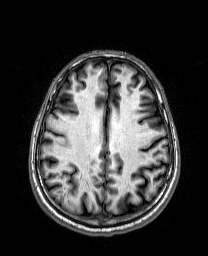
[im 46/56]
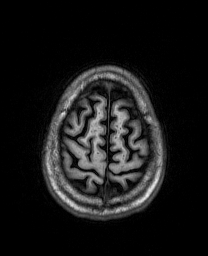
[im 56/56]
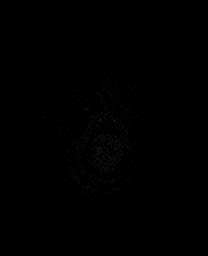

[Series 11: T2 · coronal · 5.0mm · 0.45mm/px · 4 of 29 slices shown (3 of 3)]
[im 1/29]
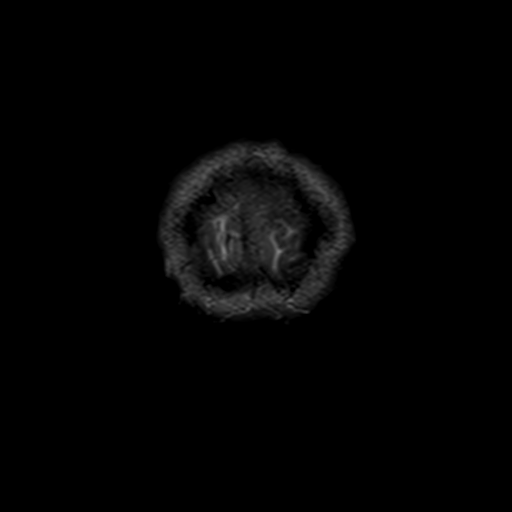
[im 10/29]
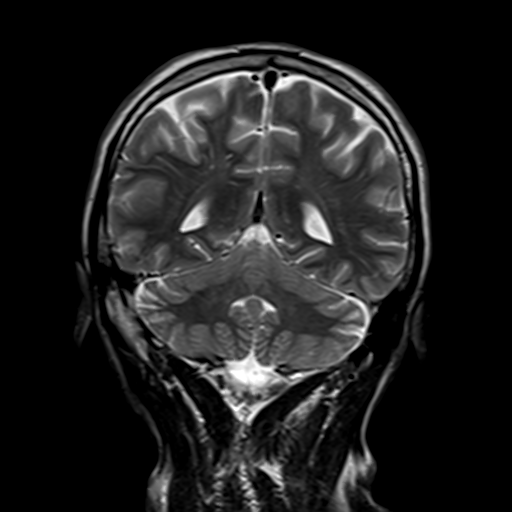
[im 19/29]
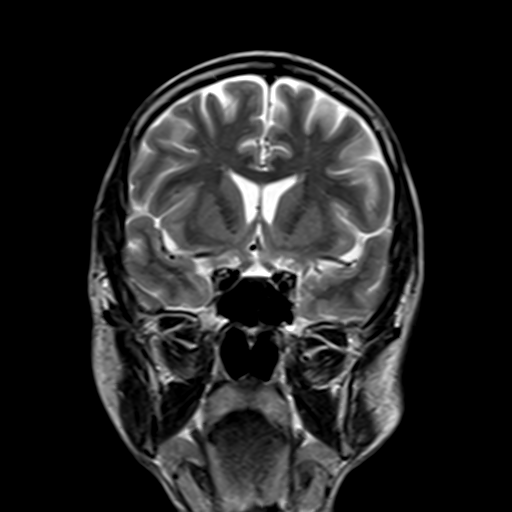
[im 29/29]
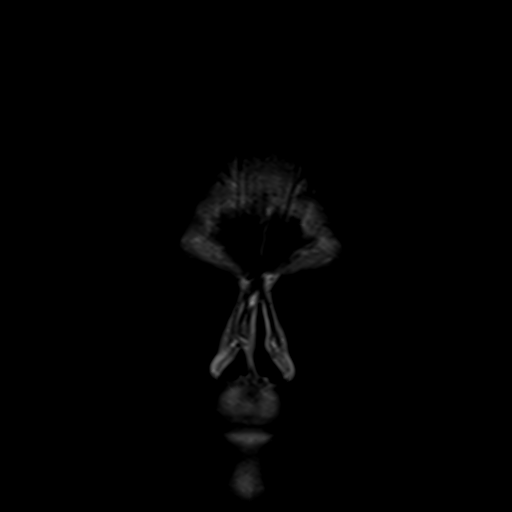

[Series 100: DWI · axial · 3.0mm · 1.20mm/px · z∈[-63,+98]mm · 7 of 55 slices shown (3 of 4)]
[im 1/55]
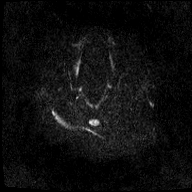
[im 10/55]
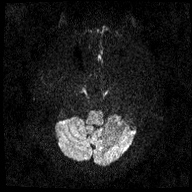
[im 19/55]
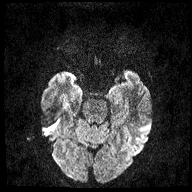
[im 28/55]
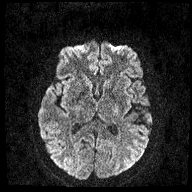
[im 37/55]
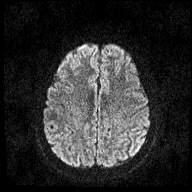
[im 46/55]
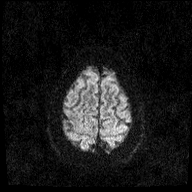
[im 55/55]
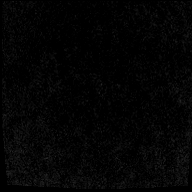

[Series 101: DWI · coronal · 3.0mm · 1.20mm/px · 6 of 47 slices shown (4 of 4)]
[im 1/47]
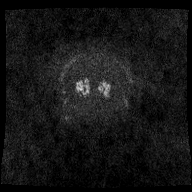
[im 10/47]
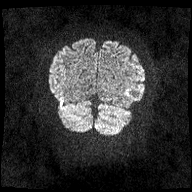
[im 19/47]
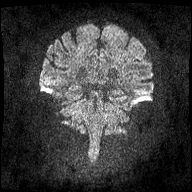
[im 28/47]
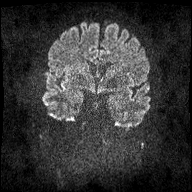
[im 37/47]
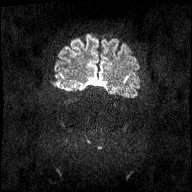
[im 47/47]
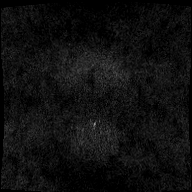

[48 of 48 positions shown; findings below may reference images not displayed]

FINDINGS: No acute infarct or intracranial hemorrhage.

Minimal nonspecific white matter changes may reflect result of small
vessel disease or migraine headaches.

No intracranial mass lesion noted on this unenhanced exam.

Major intracranial vascular structures are patent. Ectatic right
vertebral artery causes slight impression upon the right lateral
medulla.

Cervical medullary junction, pituitary region, pineal region and
orbital structures unremarkable.
IMPRESSION: No acute infarct or intracranial hemorrhage.

Minimal nonspecific white matter changes may reflect result of small
vessel disease or migraine headaches.

No intracranial mass lesion noted on this unenhanced exam.

Major intracranial vascular structures are patent. Ectatic right
vertebral artery causes slight impression upon the right lateral
medulla.

## 2017-06-28 NOTE — Progress Notes (Signed)
Patient's Name: Mark Hodges  MRN: [id number]  Referring Provider: Tracie Harrier, MD  DOB: 1956/08/27  PCP: Tracie Harrier, MD  DOS: 06/29/2017  Note by: Gaspar Cola, MD  Service setting: Ambulatory outpatient  Specialty: Interventional Pain Management  Patient type: Established  Location: [facility] (AMB) Pain Management Facility  Visit type: Interventional Procedure   Primary Reason for Visit: Interventional Pain Management Treatment. CC: No chief complaint on file.  Procedure:  Anesthesia, Analgesia, Anxiolysis:  Type: Diagnostic Cervical Facet Medial Branch Block(s) #2 Region: Posterolateral cervical spine region Level: C3, C4, C5, C6, & C7 Medial Branch Level(s) Laterality: Bilateral Paraspinal  Type: Local Anesthesia with Moderate (Conscious) Sedation Local Anesthetic: Lidocaine 1% Route: Intravenous (IV) IV Access: Secured Sedation: Meaningful verbal contact was maintained at all times during the procedure  Indication(s): Analgesia and Anxiety   Indications: 1. Cervical facet syndrome (HCC) (Bilateral)   2. Cervical spondylosis   3. 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 Cervicogenic headache    Pain Score: Pre-procedure: 6 /10 Post-procedure: 0-No pain/10  Pre-op Assessment:  Mr. Glidden is a 61 y.o. (year old), male patient, seen today for interventional treatment. He  has a past surgical history that includes Tonsillectomy; Colonoscopy with propofol (N/A, 01/26/2016); and Esophagogastroduodenoscopy (egd) with propofol (N/A, 01/26/2016). Mr. Rickett has a current medication list which includes the following prescription(s): alprostadil, aspirin, cyanocobalamin, diazepam, duloxetine, gabapentin, lamotrigine, nortriptyline, oxycodone hcl, oxycodone hcl, ropinirole, sertraline, tamsulosin, venlafaxine hcl, atorvastatin, oxycodone hcl, and prednisone, and the following Facility-Administered Medications: fentanyl, lactated ringers, and midazolam. His  primarily concern today is the No chief complaint on file.  Initial Vital Signs: There were no vitals taken for this visit. BMI: Estimated body mass index is 23.57 kg/m as calculated from the following:   Height as of this encounter: 5\' 8"  (1.727 m).   Weight as of this encounter: 155 lb (70.3 kg).  Risk Assessment: Allergies: Reviewed. He has No Known Allergies.  Allergy Precautions: None required Coagulopathies: Reviewed. None identified.  Blood-thinner therapy: None at this time Active Infection(s): Reviewed. None identified. Mr. Heider is afebrile  Site Confirmation: Mr. Dible was asked to confirm the procedure and laterality before marking the site Procedure checklist: Completed Consent: Before the procedure and under the influence of no sedative(s), amnesic(s), or anxiolytics, the patient was informed of the treatment options, risks and possible complications. To fulfill our ethical and legal obligations, as recommended by the American Medical Association's Code of Ethics, I have informed the patient of my clinical impression; the nature and purpose of the treatment or procedure; the risks, benefits, and possible complications of the intervention; the alternatives, including doing nothing; the risk(s) and benefit(s) of the alternative treatment(s) or procedure(s); and the risk(s) and benefit(s) of doing nothing. The patient was provided information about the general risks and possible complications associated with the procedure. These may include, but are not limited to: failure to achieve desired goals, infection, bleeding, organ or nerve damage, allergic reactions, paralysis, and death. In addition, the patient was informed of those risks and complications associated to Spine-related procedures, such as failure to decrease pain; infection (i.e.: Meningitis, epidural or intraspinal abscess); bleeding (i.e.: epidural hematoma, subarachnoid hemorrhage, or any other type of intraspinal or  peri-dural bleeding); organ or nerve damage (i.e.: Any type of peripheral nerve, nerve root, or spinal cord injury) with subsequent damage to sensory, motor, and/or autonomic systems, resulting in permanent pain, numbness, and/or weakness of one or several areas of the body;  allergic reactions; (i.e.: anaphylactic reaction); and/or death. Furthermore, the patient was informed of those risks and complications associated with the medications. These include, but are not limited to: allergic reactions (i.e.: anaphylactic or anaphylactoid reaction(s)); adrenal axis suppression; blood sugar elevation that in diabetics may result in ketoacidosis or comma; water retention that in patients with history of congestive heart failure may result in shortness of breath, pulmonary edema, and decompensation with resultant heart failure; weight gain; swelling or edema; medication-induced neural toxicity; particulate matter embolism and blood vessel occlusion with resultant organ, and/or nervous system infarction; and/or aseptic necrosis of one or more joints. Finally, the patient was informed that Medicine is not an exact science; therefore, there is also the possibility of unforeseen or unpredictable risks and/or possible complications that may result in a catastrophic outcome. The patient indicated having understood very clearly. We have given the patient no guarantees and we have made no promises. Enough time was given to the patient to ask questions, all of which were answered to the patient's satisfaction. Mr. Burkel has indicated that he wanted to continue with the procedure. Attestation: I, the ordering provider, attest that I have discussed with the patient the benefits, risks, side-effects, alternatives, likelihood of achieving goals, and potential problems during recovery for the procedure that I have provided informed consent. Date: 06/29/2017; Time: 7:27 AM  Pre-Procedure Preparation:  Monitoring: As per clinic  protocol. Respiration, ETCO2, SpO2, BP, heart rate and rhythm monitor placed and checked for adequate function Safety Precautions: Patient was assessed for positional comfort and pressure points before starting the procedure. Time-out: I initiated and conducted the "Time-out" before starting the procedure, as per protocol. The patient was asked to participate by confirming the accuracy of the "Time Out" information. Verification of the correct person, site, and procedure were performed and confirmed by me, the nursing staff, and the patient. "Time-out" conducted as per Joint Commission's Universal Protocol (UP.01.01.01). "Time-out" Date & Time: 06/29/2017; 0841 hrs.  Description of Procedure Process:   Position: Prone with head of the table was raised to facilitate breathing. Target Area: For Cervical Facet blocks, the target is the postero-lateral waist of the articular pillars at the C3, C4, C5, C6, & C7 levels. Approach: Posterior approach. Area Prepped: Entire Posterior Cervico-thoracic Region Prepping solution: ChloraPrep (2% chlorhexidine gluconate and 70% isopropyl alcohol) Safety Precautions: Aspiration looking for blood return was conducted prior to all injections. At no point did we inject any substances, as a needle was being advanced. No attempts were made at seeking any paresthesias. Safe injection practices and needle disposal techniques used. Medications properly checked for expiration dates. SDV (single dose vial) medications used. Description of the Procedure: Protocol guidelines were followed. The patient was placed in position over the fluoroscopy table. The target area was identified and the area prepped in the usual manner. Skin desensitized using vapocoolant spray. Skin & deeper tissues infiltrated with local anesthetic. Appropriate amount of time allowed to pass for local anesthetics to take effect. The procedure needle was introduced through the skin, ipsilateral to the reported  pain, and advanced to the target area. Bone was contacted on the posterior aspect of the articular pillars and the needle walked lateral, until the border was cleared. Lateral views taken to make sure the needle tip did not advance past the posterior third of the lateral mass of the posterior columns. The procedure was repeated in identical fashion for each level. Negative aspiration confirmed. Solution injected in intermittent fashion, asking for systemic symptoms every 0.5cc of  injectate. The needles were then removed and the area cleansed, making sure to leave some of the prepping solution back to take advantage of its long term bactericidal properties. Vitals:   06/29/17 0900 06/29/17 0906 06/29/17 0916 06/29/17 0926  BP: 113/85 120/79 113/83 122/78  Pulse: 87     Resp: 17 18 17 20   Temp:  98.1 F (36.7 C)    TempSrc:  Temporal    SpO2: 98% 96% 96% 96%  Weight:      Height:        Start Time: 0841 hrs. End Time: 0856 hrs. Materials:  Needle(s) Type: Regular needle Gauge: 22G Length: 3.5-in Medication(s): We administered lactated ringers, midazolam, fentaNYL, lidocaine, dexamethasone, ropivacaine (PF) 2 mg/mL (0.2%), dexamethasone, and ropivacaine (PF) 2 mg/mL (0.2%). Please see chart orders for dosing details.  Imaging Guidance (Spinal):  Type of Imaging Technique: Fluoroscopy Guidance (Spinal) Indication(s): Assistance in needle guidance and placement for procedures requiring needle placement in or near specific anatomical locations not easily accessible without such assistance. Exposure Time: Please see nurses notes. Contrast: None used. Fluoroscopic Guidance: I was personally present during the use of fluoroscopy. "Tunnel Vision Technique" used to obtain the best possible view of the target area. Parallax error corrected before commencing the procedure. "Direction-depth-direction" technique used to introduce the needle under continuous pulsed fluoroscopy. Once target was reached,  antero-posterior, oblique, and lateral fluoroscopic projection used confirm needle placement in all planes. Images permanently stored in EMR. Interpretation: No contrast injected. I personally interpreted the imaging intraoperatively. Adequate needle placement confirmed in multiple planes. Permanent images saved into the patient's record.  Antibiotic Prophylaxis:  Indication(s): None identified Antibiotic given: None  Post-operative Assessment:  EBL: None Complications: No immediate post-treatment complications observed by team, or reported by patient. Note: The patient tolerated the entire procedure well. A repeat set of vitals were taken after the procedure and the patient was kept under observation following institutional policy, for this type of procedure. Post-procedural neurological assessment was performed, showing return to baseline, prior to discharge. The patient was provided with post-procedure discharge instructions, including a section on how to identify potential problems. Should any problems arise concerning this procedure, the patient was given instructions to immediately contact us, at any time, without hesitation. In any case, we plan to contact the patient by telephone for a follow-up status report regarding this interventional procedure. Comments:  No additional relevant information.  Plan of Care   Imaging Orders     DG C-Arm 1-60 Min-No Report  Procedure Orders     CERVICAL FACET (MEDIAL BRANCH NERVE BLOCK)   Medications ordered for procedure: Meds ordered this encounter  Medications  . lactated ringers infusion 1,000 mL  . midazolam (VERSED) 5 MG/5ML injection 1-2 mg    Make sure Flumazenil is available in the pyxis when using this medication. If oversedation occurs, administer 0.2 mg IV over 15 sec. If after 45 sec no response, administer 0.2 mg again over 1 min; may repeat at 1 min intervals; not to exceed 4 doses (1 mg)  . fentaNYL (SUBLIMAZE) injection 25-50 mcg     Make sure Narcan is available in the pyxis when using this medication. In the event of respiratory depression (RR< 8/min): Titrate NARCAN (naloxone) in increments of 0.1 to 0.2 mg IV at 2-3 minute intervals, until desired degree of reversal.  . lidocaine (XYLOCAINE) 2 % (with pres) injection 200 mg  . dexamethasone (DECADRON) injection 10 mg  . ropivacaine (PF) 2 mg/mL (0.2%) (NAROPIN) injection 9  mL  . dexamethasone (DECADRON) injection 10 mg  . ropivacaine (PF) 2 mg/mL (0.2%) (NAROPIN) injection 9 mL   Medications administered: We administered lactated ringers, midazolam, fentaNYL, lidocaine, dexamethasone, ropivacaine (PF) 2 mg/mL (0.2%), dexamethasone, and ropivacaine (PF) 2 mg/mL (0.2%).  See the medical record for exact dosing, route, and time of administration.  New Prescriptions   No medications on file   Disposition: Discharge home  Discharge Date & Time: 06/29/2017; 0929 hrs.   Physician-requested Follow-up: Return for post-procedure eval by Dr. Dossie Arbour in 2 wks. Future Appointments Date Time Provider [place]  07/17/2017 1:30 PM Milinda Pointer, MD [facility] None  08/09/2017 8:30 AM Vevelyn Francois, NP [facility] None  08/22/2017 11:15 AM [facility] None  08/25/2017 11:15 AM Hollice Espy, MD [facility] None   Primary Care Physician: Tracie Harrier, MD Location: [facility] Note by: Gaspar Cola, MD Date: 06/29/2017; Time: 9:30 AM  Disclaimer:  Medicine is not an exact science. The only guarantee in medicine is that nothing is guaranteed. It is important to note that the decision to proceed with this intervention was based on the information collected from the patient. The Data and conclusions were drawn from the patient's questionnaire, the interview, and the physical examination. Because the information was provided in large part by the patient, it cannot be guaranteed that it has not been purposely or unconsciously  manipulated. Every effort has been made to obtain as much relevant data as possible for this evaluation. It is important to note that the conclusions that lead to this procedure are derived in large part from the available data. Always take into account that the treatment will also be dependent on availability of resources and existing treatment guidelines, considered by other Pain Management Practitioners as being common knowledge and practice, at the time of the intervention. For Medico-Legal purposes, it is also important to point out that variation in procedural techniques and pharmacological choices are the acceptable norm. The indications, contraindications, technique, and results of the above procedure should only be interpreted and judged by a Board-Certified Interventional Pain Specialist with extensive familiarity and expertise in the same exact procedure and technique.

## 2017-06-29 ENCOUNTER — Encounter: Payer: Self-pay | Admitting: Pain Medicine

## 2017-06-29 ENCOUNTER — Ambulatory Visit (HOSPITAL_BASED_OUTPATIENT_CLINIC_OR_DEPARTMENT_OTHER): Payer: Medicare Other | Admitting: Pain Medicine

## 2017-06-29 ENCOUNTER — Ambulatory Visit
Admission: RE | Admit: 2017-06-29 | Discharge: 2017-06-29 | Disposition: A | Discharge status: Home / Self Care | Payer: Medicare Other | Source: Ambulatory Visit | Attending: Pain Medicine | Admitting: Pain Medicine

## 2017-06-29 VITALS — BP 122/78 | HR 87 | Temp 98.1°F | Resp 20 | Ht 68.0 in | Wt 155.0 lb

## 2017-06-29 DIAGNOSIS — G8929 Other chronic pain: Secondary | ICD-10-CM | POA: Insufficient documentation

## 2017-06-29 DIAGNOSIS — M47812 Spondylosis without myelopathy or radiculopathy, cervical region: Secondary | ICD-10-CM

## 2017-06-29 DIAGNOSIS — G4486 Cervicogenic headache: Secondary | ICD-10-CM

## 2017-06-29 DIAGNOSIS — R51 Headache: Secondary | ICD-10-CM | POA: Diagnosis not present

## 2017-06-29 DIAGNOSIS — M542 Cervicalgia: Secondary | ICD-10-CM | POA: Insufficient documentation

## 2017-06-29 MED ORDER — LACTATED RINGERS IV SOLN
1000.0000 mL | Freq: Once | INTRAVENOUS | Status: AC
  Administered 2017-06-29: 1000 mL via INTRAVENOUS

## 2017-06-29 MED ORDER — MIDAZOLAM HCL 5 MG/5ML IJ SOLN
1.0000 mg | INTRAMUSCULAR | Status: DC | PRN
  Administered 2017-06-29: 3 mg via INTRAVENOUS
  Filled 2017-06-29: qty 5

## 2017-06-29 MED ORDER — ROPIVACAINE HCL 2 MG/ML IJ SOLN
9.0000 mL | Freq: Once | INTRAMUSCULAR | Status: AC
  Administered 2017-06-29: 10 mL via PERINEURAL
  Filled 2017-06-29: qty 10

## 2017-06-29 MED ORDER — LIDOCAINE HCL 2 % IJ SOLN
10.0000 mL | Freq: Once | INTRAMUSCULAR | Status: AC
  Administered 2017-06-29: 400 mg
  Filled 2017-06-29: qty 20

## 2017-06-29 MED ORDER — LIDOCAINE HCL 2 % IJ SOLN
INTRAMUSCULAR | Status: AC
  Filled 2017-06-29: qty 20

## 2017-06-29 MED ORDER — FENTANYL CITRATE (PF) 100 MCG/2ML IJ SOLN
25.0000 ug | INTRAMUSCULAR | Status: DC | PRN
  Administered 2017-06-29: 50 ug via INTRAVENOUS
  Filled 2017-06-29: qty 2

## 2017-06-29 MED ORDER — DEXAMETHASONE SODIUM PHOSPHATE 10 MG/ML IJ SOLN
INTRAMUSCULAR | Status: AC
  Filled 2017-06-29: qty 1

## 2017-06-29 MED ORDER — DEXAMETHASONE SODIUM PHOSPHATE 10 MG/ML IJ SOLN
10.0000 mg | Freq: Once | INTRAMUSCULAR | Status: AC
  Administered 2017-06-29: 10 mg
  Filled 2017-06-29: qty 1

## 2017-06-29 NOTE — Progress Notes (Signed)
Safety precautions to be maintained throughout the outpatient stay will include: orient to surroundings, keep bed in low position, maintain call bell within reach at all times, provide assistance with transfer out of bed and ambulation.  

## 2017-06-29 NOTE — Patient Instructions (Signed)

## 2017-06-30 ENCOUNTER — Telehealth: Payer: Self-pay

## 2017-06-30 NOTE — Telephone Encounter (Signed)
Post procedure phone call.  States he is doing fine.

## 2017-07-17 ENCOUNTER — Ambulatory Visit: Payer: No Typology Code available for payment source | Attending: Pain Medicine | Admitting: Pain Medicine

## 2017-08-09 ENCOUNTER — Ambulatory Visit: Payer: Medicare Other | Admitting: Nurse Practitioner

## 2017-08-15 ENCOUNTER — Ambulatory Visit: Payer: Medicare Other | Admitting: Nurse Practitioner

## 2017-08-22 ENCOUNTER — Other Ambulatory Visit: Payer: Medicare Other

## 2017-08-22 ENCOUNTER — Other Ambulatory Visit: Payer: Self-pay

## 2017-08-22 DIAGNOSIS — C61 Malignant neoplasm of prostate: Secondary | ICD-10-CM

## 2017-08-23 LAB — PSA: PROSTATE SPECIFIC AG, SERUM: 7.8 ng/mL — AB (ref 0.0–4.0)

## 2017-08-25 ENCOUNTER — Ambulatory Visit (INDEPENDENT_AMBULATORY_CARE_PROVIDER_SITE_OTHER): Payer: Medicare Other | Admitting: Urology

## 2017-08-25 ENCOUNTER — Encounter: Payer: Self-pay | Admitting: Urology

## 2017-08-25 VITALS — BP 114/70 | HR 108 | Ht 68.0 in | Wt 155.0 lb

## 2017-08-25 DIAGNOSIS — R350 Frequency of micturition: Secondary | ICD-10-CM | POA: Diagnosis not present

## 2017-08-25 DIAGNOSIS — N528 Other male erectile dysfunction: Secondary | ICD-10-CM

## 2017-08-25 DIAGNOSIS — C61 Malignant neoplasm of prostate: Secondary | ICD-10-CM

## 2017-08-25 NOTE — Progress Notes (Signed)
12:41 PM  08/25/17   Mark Hodges 1955/10/04 [id number]  Referring provider: Tracie Harrier, MD [address] [facility] [place], [place] [address]  Chief Complaint  Patient presents with  . Prostate Cancer    56months    HPI:  Prostate cancer  61 yo M with diagnosed in 01/2014 with T1c Gleason 3+3 prostate cancer in 2/12 cores, 1-3% and single core HGPIN dx 01/2014. PSA at time of dx 5.41. TRUS vol 34 cc. He has been on active surveillance.    Repeat biopsy on 02/20/15 per active surveillance protocol which showed only a small focus of atypical glands, suspicious for carcinoma   TRUS vol 33 cc.     His PSA has been relatively stable in the 5-7 range.  Most recent PSA 7.8 on 08/22/2017.  He returns today for PSA.  LUTS History of LUTS previously on Flomax.  He has recently started saw palmetto which works better.  Overall, he feels like his hesitancy and frequency has improved.  He feels like he is able to empty his bladder.    ED Baseline ED, failed multiple PDE5i. No sucess with VED.  Currently good success with caverject 10 mcg.  Working very well.  Using about 1/ month.  No penile scarring of pain.  No curvature.  PHx chronic back pain managed by pain clinic.     PMH: Past Medical History:  Diagnosis Date  . Arthritis   . BPH (benign prostatic hyperplasia)   . Clinical depression 10/23/2014  . Depression   . Erectile dysfunction   . Hyperlipidemia   . Hypogonadism in male   . Lower urinary tract infection   . Premature ejaculation   . Prostate cancer ([place])   . Stroke ([place])   . TIA (transient ischemic attack)     Surgical History: Past Surgical History:  Procedure Laterality Date  . COLONOSCOPY WITH PROPOFOL N/A 01/26/2016   Procedure: COLONOSCOPY WITH PROPOFOL;  Surgeon: Lollie Sails, MD;  Location: [facility] ENDOSCOPY;  Service: Endoscopy;  Laterality: N/A;  . ESOPHAGOGASTRODUODENOSCOPY (EGD) WITH PROPOFOL N/A 01/26/2016   Procedure: ESOPHAGOGASTRODUODENOSCOPY (EGD) WITH PROPOFOL;  Surgeon: Lollie Sails, MD;  Location: [facility] ENDOSCOPY;  Service: Endoscopy;  Laterality: N/A;  . TONSILLECTOMY      Home Medications:  Allergies as of 08/25/2017   No Known Allergies     Medication List        Accurate as of 08/25/17 12:41 PM. Always use your most recent med list.          alprostadil 10 MCG injection Commonly known as:  CAVERJECT IMPULSE 10 mcg by Intracavitary route as needed for erectile dysfunction. use no more than 3 times per week   aspirin 325 MG tablet Take 325 mg by mouth daily.   atorvastatin 20 MG tablet Commonly known as:  LIPITOR Take 20 mg by mouth daily at 6 PM.   cyanocobalamin 1000 MCG/ML injection Commonly known as:  (VITAMIN B-12) Inject 1,000 mcg into the muscle every 30 (thirty) days.   diazepam 5 MG tablet Commonly known as:  VALIUM TAKE 1 TABLET BY MOUTH TWICE A DAY AS NEEDED FOR ANXIETY   DULoxetine 30 MG capsule Commonly known as:  CYMBALTA 30 mg daily.   gabapentin 300 MG capsule Commonly known as:  NEURONTIN Take 300 mg by mouth 3 (three) times daily.   lamoTRIgine 150 MG tablet Commonly known as:  LAMICTAL 150 mg 2 (two) times daily.   nortriptyline 10 MG capsule  Commonly known as:  PAMELOR Take 1 capsule at bedtime in addition to one 50 mg capsule to equal 60 mg at night   Oxycodone HCl 10 MG Tabs Take 1 tablet (10 mg total) by mouth every 8 (eight) hours as needed.   Oxycodone HCl 10 MG Tabs Take 1 tablet (10 mg total) by mouth every 8 (eight) hours as needed.   Oxycodone HCl 10 MG Tabs Take 1 tablet (10 mg total) by mouth every 8 (eight) hours as needed.   rOPINIRole 0.5 MG tablet Commonly known as:  REQUIP Take 0.5 mg by mouth at bedtime.   sertraline 50 MG tablet Commonly known as:  ZOLOFT Take 50 mg by mouth daily.   Venlafaxine HCl 225 MG Tb24 Take 2 tablets by mouth daily.        Allergies: No Known Allergies  Family  History: Family History  Problem Relation Age of Onset  . Heart disease Father   . Prostate cancer Neg Hx   . Bladder Cancer Neg Hx     Social History:  reports that he has been smoking.  His smokeless tobacco use includes chew. He reports that he does not drink alcohol or use drugs.   ROS: UROLOGY Frequent Urination?: No Hard to postpone urination?: No Burning/pain with urination?: No Get up at night to urinate?: No Leakage of urine?: No Urine stream starts and stops?: No Trouble starting stream?: No Do you have to strain to urinate?: No Blood in urine?: No Urinary tract infection?: No Sexually transmitted disease?: No Injury to kidneys or bladder?: No Painful intercourse?: No Weak stream?: No Erection problems?: Yes Penile pain?: No Gastrointestinal Nausea?: No Vomiting?: No Indigestion/heartburn?: No Diarrhea?: No Constipation?: No Constitutional Fever: No Night sweats?: No Weight loss?: No Fatigue?: Yes Skin Skin rash/lesions?: No Itching?: No Eyes Blurred vision?: No Double vision?: No Ears/Nose/Throat Sore throat?: No Sinus problems?: No Hematologic/Lymphatic Swollen glands?: No Easy bruising?: Yes Cardiovascular Leg swelling?: No Chest pain?: No Respiratory Cough?: No Shortness of breath?: Yes Endocrine Excessive thirst?: No Musculoskeletal Back pain?: Yes Joint pain?: Yes Neurological Headaches?: Yes Dizziness?: No Psychologic Depression?: No Anxiety?: Yes   Physical Exam: BP 114/70   Pulse (!) 108   Ht 5\' 8"  (1.727 m)   Wt 155 lb (70.3 kg)   BMI 23.57 kg/m   Constitutional:  Alert and oriented, No acute distress. Ambulating with cane.  HEENT:  AT, moist mucus membranes.  Trachea midline, no masses. Cardiovascular: No clubbing, cyanosis, or edema. Respiratory: Normal respiratory effort, no increased work of breathing. GI: Abdomen is soft, nontender, nondistended, no abdominal masses GU: No CVA tenderness.  Rectal exam  deferred today, last performed 6 months ago. Skin: No rashes, bruises or suspicious lesions. Neurologic: Grossly intact, no focal deficits, moving all 4 extremities. Psychiatric: Normal mood and affect.   Labs: Component     Latest Ref Rng & Units 08/06/2015 12/04/2015 04/21/2016 10/21/2016  Prostate Specific Ag, Serum     0.0 - 4.0 ng/mL 6.8 (H) 7.5 (H) 6.8 (H) 8.3 (H)   Component     Latest Ref Rng & Units 02/22/2017 08/22/2017  Prostate Specific Ag, Serum     0.0 - 4.0 ng/mL 6.7 (H) 7.8 (H)    Assessment & Plan:   1. Prostate cancer  Diagnosed in 01/2014 with T1c Gleason 3+3 prostate cancer in 2/12 cores, 1-3% and single core HGPIN dx 01/2014. PSA at time of dx 5.41. Repeat bx 01/2015,  Single focus of atypical cells but no evidence  of clinically signficant cancer despite rising PSA to 7.0.   PSA remains stable today and patient's standard arrange Recommend continuation of active surveillance, every 6 months if PSA remains stable and DRE annually  2. Other male erectile dysfunction Continue Caverject 10 mcg prn  3. LUTS/ urinary frequency No longer taking Flomax but has had significant improvement on saw palmetto Continue this supplement  Return in about 6 months (around 02/22/2018), or PSA/ DRE.  Hollice Espy, MD  [facility] [address] [place], [place] [address] [phone]

## 2017-09-07 ENCOUNTER — Other Ambulatory Visit: Payer: Self-pay

## 2017-09-07 ENCOUNTER — Encounter: Payer: Self-pay | Admitting: Nurse Practitioner

## 2017-09-07 ENCOUNTER — Ambulatory Visit: Payer: Medicare Other | Attending: Nurse Practitioner | Admitting: Nurse Practitioner

## 2017-09-07 VITALS — BP 123/75 | HR 92 | Temp 98.2°F | Resp 16 | Ht 68.0 in | Wt 155.0 lb

## 2017-09-07 DIAGNOSIS — M542 Cervicalgia: Secondary | ICD-10-CM | POA: Diagnosis not present

## 2017-09-07 DIAGNOSIS — F17210 Nicotine dependence, cigarettes, uncomplicated: Secondary | ICD-10-CM | POA: Insufficient documentation

## 2017-09-07 DIAGNOSIS — F329 Major depressive disorder, single episode, unspecified: Secondary | ICD-10-CM | POA: Insufficient documentation

## 2017-09-07 DIAGNOSIS — G8929 Other chronic pain: Secondary | ICD-10-CM

## 2017-09-07 DIAGNOSIS — Z8546 Personal history of malignant neoplasm of prostate: Secondary | ICD-10-CM | POA: Diagnosis not present

## 2017-09-07 DIAGNOSIS — G894 Chronic pain syndrome: Secondary | ICD-10-CM | POA: Insufficient documentation

## 2017-09-07 DIAGNOSIS — M47812 Spondylosis without myelopathy or radiculopathy, cervical region: Secondary | ICD-10-CM | POA: Diagnosis not present

## 2017-09-07 DIAGNOSIS — M48061 Spinal stenosis, lumbar region without neurogenic claudication: Secondary | ICD-10-CM | POA: Diagnosis not present

## 2017-09-07 DIAGNOSIS — Z79899 Other long term (current) drug therapy: Secondary | ICD-10-CM | POA: Insufficient documentation

## 2017-09-07 DIAGNOSIS — N529 Male erectile dysfunction, unspecified: Secondary | ICD-10-CM | POA: Insufficient documentation

## 2017-09-07 DIAGNOSIS — Z7982 Long term (current) use of aspirin: Secondary | ICD-10-CM | POA: Insufficient documentation

## 2017-09-07 DIAGNOSIS — R51 Headache: Secondary | ICD-10-CM | POA: Diagnosis not present

## 2017-09-07 DIAGNOSIS — Z5181 Encounter for therapeutic drug level monitoring: Secondary | ICD-10-CM | POA: Diagnosis present

## 2017-09-07 DIAGNOSIS — F411 Generalized anxiety disorder: Secondary | ICD-10-CM | POA: Insufficient documentation

## 2017-09-07 DIAGNOSIS — G950 Syringomyelia and syringobulbia: Secondary | ICD-10-CM | POA: Insufficient documentation

## 2017-09-07 DIAGNOSIS — M549 Dorsalgia, unspecified: Secondary | ICD-10-CM | POA: Insufficient documentation

## 2017-09-07 DIAGNOSIS — E7800 Pure hypercholesterolemia, unspecified: Secondary | ICD-10-CM | POA: Diagnosis not present

## 2017-09-07 DIAGNOSIS — M5412 Radiculopathy, cervical region: Secondary | ICD-10-CM

## 2017-09-07 DIAGNOSIS — Z8673 Personal history of transient ischemic attack (TIA), and cerebral infarction without residual deficits: Secondary | ICD-10-CM | POA: Diagnosis not present

## 2017-09-07 DIAGNOSIS — Z79891 Long term (current) use of opiate analgesic: Secondary | ICD-10-CM

## 2017-09-07 MED ORDER — OXYCODONE HCL 10 MG PO TABS: 10.0000 mg | ORAL_TABLET | Freq: Three times a day (TID) | ORAL | 0 refills | Status: DC | PRN

## 2017-09-07 NOTE — Patient Instructions (Addendum)
____________________________________________________________________________________________  Medication Rules  Applies to: All patients receiving prescriptions (written or electronic).  Pharmacy of record: Pharmacy where electronic prescriptions will be sent. If written prescriptions are taken to a different pharmacy, please inform the nursing staff. The pharmacy listed in the electronic medical record should be the one where you would like electronic prescriptions to be sent.  Prescription refills: Only during scheduled appointments. Applies to both, written and electronic prescriptions.  NOTE: The following applies primarily to controlled substances (Opioid* Pain Medications).   Patient's responsibilities: 1. Pain Pills: Bring all pain pills to every appointment (except for procedure appointments). 2. Pill Bottles: Bring pills in original pharmacy bottle. Always bring newest bottle. Bring bottle, even if empty. 3. Medication refills: You are responsible for knowing and keeping track of what medications you need refilled. The day before your appointment, write a list of all prescriptions that need to be refilled. Bring that list to your appointment and give it to the admitting nurse. Prescriptions will be written only during appointments. If you forget a medication, it will not be "Called in", "Faxed", or "electronically sent". You will need to get another appointment to get these prescribed. 4. Prescription Accuracy: You are responsible for carefully inspecting your prescriptions before leaving our office. Have the discharge nurse carefully go over each prescription with you, before taking them home. Make sure that your name is accurately spelled, that your address is correct. Check the name and dose of your medication to make sure it is accurate. Check the number of pills, and the written instructions to make sure they are clear and accurate. Make sure that you are given enough medication to  last until your next medication refill appointment. 5. Taking Medication: Take medication as prescribed. Never take more pills than instructed. Never take medication more frequently than prescribed. Taking less pills or less frequently is permitted and encouraged, when it comes to controlled substances (written prescriptions).  6. Inform other Doctors: Always inform, all of your healthcare providers, of all the medications you take. 7. Pain Medication from other Providers: You are not allowed to accept any additional pain medication from any other Doctor or Healthcare provider. There are two exceptions to this rule. (see below) In the event that you require additional pain medication, you are responsible for notifying us, as stated below. 8. Medication Agreement: You are responsible for carefully reading and following our Medication Agreement. This must be signed before receiving any prescriptions from our practice. Safely store a copy of your signed Agreement. Violations to the Agreement will result in no further prescriptions. (Additional copies of our Medication Agreement are available upon request.) 9. Laws, Rules, & Regulations: All patients are expected to follow all Federal and State Laws, Statutes, Rules, & Regulations. Ignorance of the Laws does not constitute a valid excuse. The use of any illegal substances is prohibited. 10. Adopted CDC guidelines & recommendations: Target dosing levels will be at or below 60 MME/day. Use of benzodiazepines** is not recommended.  Exceptions: There are only two exceptions to the rule of not receiving pain medications from other Healthcare Providers. 1. Exception #1 (Emergencies): In the event of an emergency (i.e.: accident requiring emergency care), you are allowed to receive additional pain medication. However, you are responsible for: As soon as you are able, call our office [phone], at any time of the day or night, and leave a message stating your  name, the date and nature of the emergency, and the name and dose of the medication   prescribed. In the event that your call is answered by a member of our staff, make sure to document and save the date, time, and the name of the person that took your information.  2. Exception #2 (Planned Surgery): In the event that you are scheduled by another doctor or dentist to have any type of surgery or procedure, you are allowed (for a period no longer than 30 days), to receive additional pain medication, for the acute post-op pain. However, in this case, you are responsible for picking up a copy of our "Post-op Pain Management for Surgeons" handout, and giving it to your surgeon or dentist. This document is available at our office, and does not require an appointment to obtain it. Simply go to our office during business hours (Monday-Thursday from 8:00 AM to 4:00 PM) (Friday 8:00 AM to 12:00 Noon) or if you have a scheduled appointment with us, prior to your surgery, and ask for it by name. In addition, you will need to provide us with your name, name of your surgeon, type of surgery, and date of procedure or surgery.  *Opioid medications include: morphine, codeine, oxycodone, oxymorphone, hydrocodone, hydromorphone, meperidine, tramadol, tapentadol, buprenorphine, fentanyl, methadone. **Benzodiazepine medications include: diazepam (Valium), alprazolam (Xanax), clonazepam (Klonopine), lorazepam (Ativan), clorazepate (Tranxene), chlordiazepoxide (Librium), estazolam (Prosom), oxazepam (Serax), temazepam (Restoril), triazolam (Halcion)  ____________________________________________________________________________________________  ____________________________________________________________________________________________  Pain Scale  Introduction: The pain score used by this practice is the Verbal Numerical Rating Scale (VNRS-11). This is an 11-point scale. It is for adults and children 10 years or older. There are  significant differences in how the pain score is reported, used, and applied. Forget everything you learned in the past and learn this scoring system.  General Information: The scale should reflect your current level of pain. Unless you are specifically asked for the level of your worst pain, or your average pain. If you are asked for one of these two, then it should be understood that it is over the past 24 hours.  Basic Activities of Daily Living (ADL): Personal hygiene, dressing, eating, transferring, and using restroom.  Instructions: Most patients tend to report their level of pain as a combination of two factors, their physical pain and their psychosocial pain. This last one is also known as "suffering" and it is reflection of how physical pain affects you socially and psychologically. From now on, report them separately. From this point on, when asked to report your pain level, report only your physical pain. Use the following table for reference.  Pain Clinic Pain Levels (0-5/10)  Pain Level Score  Description  No Pain 0   Mild pain 1 Nagging, annoying, but does not interfere with basic activities of daily living (ADL). Patients are able to eat, bathe, get dressed, toileting (being able to get on and off the toilet and perform personal hygiene functions), transfer (move in and out of bed or a chair without assistance), and maintain continence (able to control bladder and bowel functions). Blood pressure and heart rate are unaffected. A normal heart rate for a healthy adult ranges from 60 to 100 bpm (beats per minute).   Mild to moderate pain 2 Noticeable and distracting. Impossible to hide from other people. More frequent flare-ups. Still possible to adapt and function close to normal. It can be very annoying and may have occasional stronger flare-ups. With discipline, patients may get used to it and adapt.   Moderate pain 3 Interferes significantly with activities of daily living (ADL). It  becomes difficult   to feed, bathe, get dressed, get on and off the toilet or to perform personal hygiene functions. Difficult to get in and out of bed or a chair without assistance. Very distracting. With effort, it can be ignored when deeply involved in activities.   Moderately severe pain 4 Impossible to ignore for more than a few minutes. With effort, patients may still be able to manage work or participate in some social activities. Very difficult to concentrate. Signs of autonomic nervous system discharge are evident: dilated pupils (mydriasis); mild sweating (diaphoresis); sleep interference. Heart rate becomes elevated (>115 bpm). Diastolic blood pressure (lower number) rises above 100 mmHg. Patients find relief in laying down and not moving.   Severe pain 5 Intense and extremely unpleasant. Associated with frowning face and frequent crying. Pain overwhelms the senses.  Ability to do any activity or maintain social relationships becomes significantly limited. Conversation becomes difficult. Pacing back and forth is common, as getting into a comfortable position is nearly impossible. Pain wakes you up from deep sleep. Physical signs will be obvious: pupillary dilation; increased sweating; goosebumps; brisk reflexes; cold, clammy hands and feet; nausea, vomiting or dry heaves; loss of appetite; significant sleep disturbance with inability to fall asleep or to remain asleep. When persistent, significant weight loss is observed due to the complete loss of appetite and sleep deprivation.  Blood pressure and heart rate becomes significantly elevated. Caution: If elevated blood pressure triggers a pounding headache associated with blurred vision, then the patient should immediately seek attention at an urgent or emergency care unit, as these may be signs of an impending stroke.    Emergency Department Pain Levels (6-10/10)  Emergency Room Pain 6 Severely limiting. Requires emergency care and should not be  seen or managed at an outpatient pain management facility. Communication becomes difficult and requires great effort. Assistance to reach the emergency department may be required. Facial flushing and profuse sweating along with potentially dangerous increases in heart rate and blood pressure will be evident.   Distressing pain 7 Self-care is very difficult. Assistance is required to transport, or use restroom. Assistance to reach the emergency department will be required. Tasks requiring coordination, such as bathing and getting dressed become very difficult.   Disabling pain 8 Self-care is no longer possible. At this level, pain is disabling. The individual is unable to do even the most "basic" activities such as walking, eating, bathing, dressing, transferring to a bed, or toileting. Fine motor skills are lost. It is difficult to think clearly.   Incapacitating pain 9 Pain becomes incapacitating. Thought processing is no longer possible. Difficult to remember your own name. Control of movement and coordination are lost.   The worst pain imaginable 10 At this level, most patients pass out from pain. When this level is reached, collapse of the autonomic nervous system occurs, leading to a sudden drop in blood pressure and heart rate. This in turn results in a temporary and dramatic drop in blood flow to the brain, leading to a loss of consciousness. Fainting is one of the body's self defense mechanisms. Passing out puts the brain in a calmed state and causes it to shut down for a while, in order to begin the healing process.    Summary: 1. Refer to this scale when providing us with your pain level. 2. Be accurate and careful when reporting your pain level. This will help with your care. 3. Over-reporting your pain level will lead to loss of credibility. 4. Even a level of   1/10 means that there is pain and will be treated at our facility. 5. High, inaccurate reporting will be documented as "Symptom  Exaggeration", leading to loss of credibility and suspicions of possible secondary gains such as obtaining more narcotics, or wanting to appear disabled, for fraudulent reasons. 6. Only pain levels of 5 or below will be seen at our facility. 7. Pain levels of 6 and above will be sent to the Emergency Department and the appointment cancelled. ____________________________________________________________________________________________   BMI interpretation table: BMI level Category Range association with higher incidence of chronic pain  <18 kg/m2 Underweight   18.5-24.9 kg/m2 Ideal body weight   25-29.9 kg/m2 Overweight Increased incidence by 20%  30-34.9 kg/m2 Obese (Class I) Increased incidence by 68%  35-39.9 kg/m2 Severe obesity (Class II) Increased incidence by 136%  >40 kg/m2 Extreme obesity (Class III) Increased incidence by 254%   BMI Readings from Last 4 Encounters:  09/07/17 23.57 kg/m  08/25/17 23.57 kg/m  06/29/17 23.57 kg/m  06/21/17 23.26 kg/m   Wt Readings from Last 4 Encounters:  09/07/17 155 lb (70.3 kg)  08/25/17 155 lb (70.3 kg)  06/29/17 155 lb (70.3 kg)  06/21/17 153 lb (69.4 kg)

## 2017-09-07 NOTE — Progress Notes (Signed)
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 transforaminal epiduralsteroid injection  Diagnostic midline T12-L1 lumbar epiduralsteroid injection    Palliative PRN treatment(s):   Diagnostic bilateral greater occipital nerve block under fluoroscopic guidance and IV Diagnostic bilateral cervical facet block  Palliative bilateral lumbar facetblock  Palliative right intra-articular hipjoint injection  Diagnostic bilateral L3-4 transforaminal epiduralsteroid injection  Diagnostic midline T12-L1 lumbar epiduralsteroid injection    Provider-requested follow-up: Return in about 3 months (around 12/06/2017) for MedMgmt with Me Dionisio David).  Future Appointments  Date Time Provider Drew  12/04/2017  9:30 AM Vevelyn Francois, NP [facility] None  02/22/2018 10:45 AM [facility] None  02/27/2018 10:15 AM Hollice Espy, MD [facility] None   Primary Care Physician: Tracie Harrier, MD Location: [facility]  Facility Note by: Vevelyn Francois NP Date: 09/07/2017; Time: 10:05 AM  Pain Score Disclaimer: We use the NRS-11 scale. This is a self-reported, subjective measurement of pain severity with only modest accuracy. It is used primarily to identify changes within a particular patient. It must be understood that outpatient pain scales are significantly less accurate that those used for research, where they can be applied under ideal controlled circumstances with minimal exposure to variables. In reality, the score is likely to be a combination of pain intensity and pain affect, where pain affect describes the degree of emotional arousal or changes in action readiness caused by the sensory experience of pain. Factors such as social and work situation, setting, emotional state, anxiety levels, expectation, and prior pain experience may influence pain perception and show large inter-individual differences that may also be affected by time variables.  Patient instructions provided during this appointment: Patient Instructions    ____________________________________________________________________________________________  Medication Rules  Applies to: All patients receiving prescriptions (written or electronic).  Pharmacy of record: Pharmacy where electronic prescriptions will be sent. If written prescriptions are taken to a different pharmacy, please inform the nursing staff. The pharmacy listed in the electronic medical record should be the one where you would like electronic prescriptions to be sent.  Prescription refills: Only during scheduled appointments. Applies to both, written and electronic prescriptions.  NOTE: The following applies primarily to controlled substances (Opioid* Pain Medications).   Patient's responsibilities: 1. Pain Pills: Bring all pain pills to every appointment (except for procedure appointments). 2. Pill Bottles: Bring pills in original pharmacy  bottle. Always bring  newest bottle. Bring bottle, even if empty. 3. Medication refills: You are responsible for knowing and keeping track of what medications you need refilled. The day before your appointment, write a list of all prescriptions that need to be refilled. Bring that list to your appointment and give it to the admitting nurse. Prescriptions will be written only during appointments. If you forget a medication, it will not be "Called in", "Faxed", or "electronically sent". You will need to get another appointment to get these prescribed. 4. Prescription Accuracy: You are responsible for carefully inspecting your prescriptions before leaving our office. Have the discharge nurse carefully go over each prescription with you, before taking them home. Make sure that your name is accurately spelled, that your address is correct. Check the name and dose of your medication to make sure it is accurate. Check the number of pills, and the written instructions to make sure they are clear and accurate. Make sure that you are given enough medication to last until your next medication refill appointment. 5. Taking Medication: Take medication as prescribed. Never take more pills than instructed. Never take medication more frequently than prescribed. Taking less pills or less frequently is permitted and encouraged, when it comes to controlled substances (written prescriptions).  6. Inform other Doctors: Always inform, all of your healthcare providers, of all the medications you take. 7. Pain Medication from other Providers: You are not allowed to accept any additional pain medication from any other Doctor or Healthcare provider. There are two exceptions to this rule. (see below) In the event that you require additional pain medication, you are responsible for notifying us, as stated below. 8. Medication Agreement: You are responsible for carefully reading and following our Medication Agreement. This must be signed before receiving any  prescriptions from our practice. Safely store a copy of your signed Agreement. Violations to the Agreement will result in no further prescriptions. (Additional copies of our Medication Agreement are available upon request.) 9. Laws, Rules, & Regulations: All patients are expected to follow all Federal and [employer], [employer], Rules, [employer]. Ignorance of the Laws does not constitute a valid excuse. The use of any illegal substances is prohibited. 10. Adopted CDC guidelines & recommendations: Target dosing levels will be at or below 60 MME/day. Use of benzodiazepines** is not recommended.  Exceptions: There are only two exceptions to the rule of not receiving pain medications from other Healthcare Providers. 1. Exception #1 (Emergencies): In the event of an emergency (i.e.: accident requiring emergency care), you are allowed to receive additional pain medication. However, you are responsible for: As soon as you are able, call our office [phone], at any time of the day or night, and leave a message stating your name, the date and nature of the emergency, and the name and dose of the medication prescribed. In the event that your call is answered by a member of our staff, make sure to document and save the date, time, and the name of the person that took your information.  2. Exception #2 (Planned Surgery): In the event that you are scheduled by another doctor or dentist to have any type of surgery or procedure, you are allowed (for a period no longer than 30 days), to receive additional pain medication, for the acute post-op pain. However, in this case, you are responsible for picking up a copy of our "Post-op Pain Management for Surgeons" handout, and giving it to your surgeon or dentist. This document is  available at our office, and does not require an appointment to obtain it. Simply go to our office during business hours (Monday-Thursday from 8:00 AM to 4:00 PM) (Friday 8:00 AM to 12:00 Noon) or  if you have a scheduled appointment with [place], prior to your surgery, and ask for it by name. In addition, you will need to provide [place] with your name, name of your surgeon, type of surgery, and date of procedure or surgery.  *Opioid medications include: morphine, codeine, oxycodone, oxymorphone, hydrocodone, hydromorphone, meperidine, tramadol, tapentadol, buprenorphine, fentanyl, methadone. **Benzodiazepine medications include: diazepam (Valium), alprazolam (Xanax), clonazepam (Klonopine), lorazepam (Ativan), clorazepate (Tranxene), chlordiazepoxide (Librium), estazolam (Prosom), oxazepam (Serax), temazepam (Restoril), triazolam (Halcion)  ____________________________________________________________________________________________  ____________________________________________________________________________________________  Pain Scale  Introduction: The pain score used by this practice is the Verbal Numerical Rating Scale (VNRS-11). This is an 11-point scale. It is for adults and children 10 years or older. There are significant differences in how the pain score is reported, used, and applied. Forget everything you learned in the past and learn this scoring system.  General Information: The scale should reflect your current level of pain. Unless you are specifically asked for the level of your worst pain, or your average pain. If you are asked for one of these two, then it should be understood that it is over the past 24 hours.  Basic Activities of Daily Living (ADL): Personal hygiene, dressing, eating, transferring, and using restroom.  Instructions: Most patients tend to report their level of pain as a combination of two factors, their physical pain and their psychosocial pain. This last one is also known as "suffering" and it is reflection of how physical pain affects you socially and psychologically. From now on, report them separately. From this point on, when asked to report your pain level,  report only your physical pain. Use the following table for reference.  Pain Clinic Pain Levels (0-5/10)  Pain Level Score  Description  No Pain 0   Mild pain 1 Nagging, annoying, but does not interfere with basic activities of daily living (ADL). Patients are able to eat, bathe, get dressed, toileting (being able to get on and off the toilet and perform personal hygiene functions), transfer (move in and out of bed or a chair without assistance), and maintain continence (able to control bladder and bowel functions). Blood pressure and heart rate are unaffected. A normal heart rate for a healthy adult ranges from 60 to 100 bpm (beats per minute).   Mild to moderate pain 2 Noticeable and distracting. Impossible to hide from other people. More frequent flare-ups. Still possible to adapt and function close to normal. It can be very annoying and may have occasional stronger flare-ups. With discipline, patients may get used to it and adapt.   Moderate pain 3 Interferes significantly with activities of daily living (ADL). It becomes difficult to feed, bathe, get dressed, get on and off the toilet or to perform personal hygiene functions. Difficult to get in and out of bed or a chair without assistance. Very distracting. With effort, it can be ignored when deeply involved in activities.   Moderately severe pain 4 Impossible to ignore for more than a few minutes. With effort, patients may still be able to manage work or participate in some social activities. Very difficult to concentrate. Signs of autonomic nervous system discharge are evident: dilated pupils (mydriasis); mild sweating (diaphoresis); sleep interference. Heart rate becomes elevated (>115 bpm). Diastolic blood pressure (lower number) rises above 100 mmHg. Patients find relief  in laying down and not moving.   Severe pain 5 Intense and extremely unpleasant. Associated with frowning face and frequent crying. Pain overwhelms the senses.  Ability to  do any activity or maintain social relationships becomes significantly limited. Conversation becomes difficult. Pacing back and forth is common, as getting into a comfortable position is nearly impossible. Pain wakes you up from deep sleep. Physical signs will be obvious: pupillary dilation; increased sweating; goosebumps; brisk reflexes; cold, clammy hands and feet; nausea, vomiting or dry heaves; loss of appetite; significant sleep disturbance with inability to fall asleep or to remain asleep. When persistent, significant weight loss is observed due to the complete loss of appetite and sleep deprivation.  Blood pressure and heart rate becomes significantly elevated. Caution: If elevated blood pressure triggers a pounding headache associated with blurred vision, then the patient should immediately seek attention at an urgent or emergency care unit, as these may be signs of an impending stroke.    Emergency Department Pain Levels (6-10/10)  Emergency Room Pain 6 Severely limiting. Requires emergency care and should not be seen or managed at an outpatient pain management facility. Communication becomes difficult and requires great effort. Assistance to reach the emergency department may be required. Facial flushing and profuse sweating along with potentially dangerous increases in heart rate and blood pressure will be evident.   Distressing pain 7 Self-care is very difficult. Assistance is required to transport, or use restroom. Assistance to reach the emergency department will be required. Tasks requiring coordination, such as bathing and getting dressed become very difficult.   Disabling pain 8 Self-care is no longer possible. At this level, pain is disabling. The individual is unable to do even the most "basic" activities such as walking, eating, bathing, dressing, transferring to a bed, or toileting. Fine motor skills are lost. It is difficult to think clearly.   Incapacitating pain 9 Pain becomes  incapacitating. Thought processing is no longer possible. Difficult to remember your own name. Control of movement and coordination are lost.   The worst pain imaginable 10 At this level, most patients pass out from pain. When this level is reached, collapse of the autonomic nervous system occurs, leading to a sudden drop in blood pressure and heart rate. This in turn results in a temporary and dramatic drop in blood flow to the brain, leading to a loss of consciousness. Fainting is one of the body's self defense mechanisms. Passing out puts the brain in a calmed state and causes it to shut down for a while, in order to begin the healing process.    Summary: 1. Refer to this scale when providing [place] with your pain level. 2. Be accurate and careful when reporting your pain level. This will help with your care. 3. Over-reporting your pain level will lead to loss of credibility. 4. Even a level of 1/10 means that there is pain and will be treated at our facility. 5. High, inaccurate reporting will be documented as "Symptom Exaggeration", leading to loss of credibility and suspicions of possible secondary gains such as obtaining more narcotics, or wanting to appear disabled, for fraudulent reasons. 6. Only pain levels of 5 or below will be seen at our facility. 7. Pain levels of 6 and above will be sent to the Emergency Department and the appointment cancelled. ____________________________________________________________________________________________   BMI interpretation table: BMI level Category Range association with higher incidence of chronic pain  <18 kg/m2 Underweight   18.5-24.9 kg/m2 Ideal body weight   25-29.9 kg/m2  Overweight Increased incidence by 20%  30-34.9 kg/m2 Obese (Class I) Increased incidence by 68%  35-39.9 kg/m2 Severe obesity (Class II) Increased incidence by 136%  >40 kg/m2 Extreme obesity (Class III) Increased incidence by 254%   BMI Readings from Last 4 Encounters:   09/07/17 23.57 kg/m  08/25/17 23.57 kg/m  06/29/17 23.57 kg/m  06/21/17 23.26 kg/m   Wt Readings from Last 4 Encounters:  09/07/17 155 lb (70.3 kg)  08/25/17 155 lb (70.3 kg)  06/29/17 155 lb (70.3 kg)  06/21/17 153 lb (69.4 kg)

## 2017-09-07 NOTE — Progress Notes (Signed)
Nursing Pain Medication Assessment:  Safety precautions to be maintained throughout the outpatient stay will include: orient to surroundings, keep bed in low position, maintain call bell within reach at all times, provide assistance with transfer out of bed and ambulation.  Medication Inspection Compliance: Mark Hodges did not comply with our request to bring his pills to be counted. He was reminded that bringing the medication bottles, even when empty, is a requirement.  Medication: None brought in. Pill/Patch Count: None available to be counted. Bottle Appearance: No container available. Did not bring bottle(s) to appointment. Filled Date: N/A Last Medication intake:  one month ago

## 2017-09-14 LAB — TOXASSURE SELECT 13 (MW), URINE

## 2017-11-02 ENCOUNTER — Telehealth: Payer: Self-pay

## 2017-11-02 NOTE — Telephone Encounter (Signed)
Pharmacy called and has question about Rx, Please call back

## 2017-11-02 NOTE — Telephone Encounter (Signed)
Pharmacy asking if Ms. Mark Hodges is aware that patient takes a benzodiazapine with opiod.  Spoke with Ms. Mark Hodges, ok to fill. Pharmacy notified.

## 2017-11-23 ENCOUNTER — Ambulatory Visit
Admission: RE | Admit: 2017-11-23 | Discharge: 2017-11-23 | Disposition: A | Discharge status: Home / Self Care | Payer: Medicare Other | Source: Ambulatory Visit | Attending: Pain Medicine | Admitting: Pain Medicine

## 2017-11-23 ENCOUNTER — Ambulatory Visit (HOSPITAL_BASED_OUTPATIENT_CLINIC_OR_DEPARTMENT_OTHER): Payer: Medicare Other | Admitting: Pain Medicine

## 2017-11-23 ENCOUNTER — Other Ambulatory Visit: Payer: Self-pay

## 2017-11-23 ENCOUNTER — Encounter: Payer: Self-pay | Admitting: Pain Medicine

## 2017-11-23 VITALS — BP 125/84 | HR 103 | Temp 98.6°F | Resp 22 | Ht 68.0 in | Wt 155.0 lb

## 2017-11-23 DIAGNOSIS — Z79899 Other long term (current) drug therapy: Secondary | ICD-10-CM | POA: Diagnosis not present

## 2017-11-23 DIAGNOSIS — M47812 Spondylosis without myelopathy or radiculopathy, cervical region: Secondary | ICD-10-CM | POA: Insufficient documentation

## 2017-11-23 DIAGNOSIS — F419 Anxiety disorder, unspecified: Secondary | ICD-10-CM | POA: Diagnosis not present

## 2017-11-23 DIAGNOSIS — G8929 Other chronic pain: Secondary | ICD-10-CM | POA: Insufficient documentation

## 2017-11-23 DIAGNOSIS — Z7982 Long term (current) use of aspirin: Secondary | ICD-10-CM | POA: Insufficient documentation

## 2017-11-23 DIAGNOSIS — M542 Cervicalgia: Secondary | ICD-10-CM | POA: Diagnosis present

## 2017-11-23 DIAGNOSIS — Z79891 Long term (current) use of opiate analgesic: Secondary | ICD-10-CM | POA: Diagnosis not present

## 2017-11-23 MED ORDER — DEXAMETHASONE SODIUM PHOSPHATE 10 MG/ML IJ SOLN
10.0000 mg | Freq: Once | INTRAMUSCULAR | Status: AC
  Administered 2017-11-23: 10 mg
  Filled 2017-11-23: qty 1

## 2017-11-23 MED ORDER — LACTATED RINGERS IV SOLN
1000.0000 mL | Freq: Once | INTRAVENOUS | Status: AC
  Administered 2017-11-23: 1000 mL via INTRAVENOUS

## 2017-11-23 MED ORDER — ROPIVACAINE HCL 2 MG/ML IJ SOLN
9.0000 mL | Freq: Once | INTRAMUSCULAR | Status: AC
  Administered 2017-11-23: 9 mL via PERINEURAL
  Filled 2017-11-23: qty 10

## 2017-11-23 MED ORDER — FENTANYL CITRATE (PF) 100 MCG/2ML IJ SOLN
25.0000 ug | INTRAMUSCULAR | Status: DC | PRN
  Administered 2017-11-23: 100 ug via INTRAVENOUS
  Filled 2017-11-23: qty 2

## 2017-11-23 MED ORDER — MIDAZOLAM HCL 5 MG/5ML IJ SOLN
1.0000 mg | INTRAMUSCULAR | Status: DC | PRN
  Administered 2017-11-23: 3 mg via INTRAVENOUS
  Filled 2017-11-23: qty 5

## 2017-11-23 MED ORDER — ROPIVACAINE HCL 2 MG/ML IJ SOLN
9.0000 mL | Freq: Once | INTRAMUSCULAR | Status: DC
  Filled 2017-11-23: qty 10

## 2017-11-23 MED ORDER — LIDOCAINE HCL 2 % IJ SOLN
10.0000 mL | Freq: Once | INTRAMUSCULAR | Status: AC
  Administered 2017-11-23: 400 mg
  Filled 2017-11-23: qty 20

## 2017-11-23 NOTE — Patient Instructions (Signed)

## 2017-11-23 NOTE — Progress Notes (Signed)
Patient's Name: Mark Hodges  MRN: [id number]  Referring Provider: Milinda Pointer, MD  DOB: May 29, 1956  PCP: Tracie Harrier, MD  DOS: 11/23/2017  Note by: Gaspar Cola, MD  Service setting: Ambulatory outpatient  Specialty: Interventional Pain Management  Patient type: Established  Location: [facility] (AMB) Pain Management Facility  Visit type: Interventional Procedure   Primary Reason for Visit: Interventional Pain Management Treatment. CC: Neck Pain (bilateral)  Procedure:  Anesthesia, Analgesia, Anxiolysis:  Type: Diagnostic Cervical Facet Medial Branch Block(s) #3  Region: Posterolateral cervical spine region Level: C3, C4, C5, C6, & C7 Medial Branch Level(s) Laterality: Bilateral Paraspinal  Type: Local Anesthesia with Moderate (Conscious) Sedation Local Anesthetic: Lidocaine 1% Route: Intravenous (IV) IV Access: Secured Sedation: Meaningful verbal contact was maintained at all times during the procedure  Indication(s): Analgesia and Anxiety   Indications: 1. Spondylosis without myelopathy or radiculopathy, cervical region   2. Cervical facet syndrome (HCC) (Bilateral)   3. 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 Cervical spondylosis    Pain Score: Pre-procedure: 5 /10 Post-procedure: 0-No pain/10  Pre-op Assessment:  Mark Hodges is a 62 y.o. (year old), male patient, seen today for interventional treatment. He  has a past surgical history that includes Tonsillectomy; Colonoscopy with propofol (N/A, 01/26/2016); and Esophagogastroduodenoscopy (egd) with propofol (N/A, 01/26/2016). Mark Hodges has a current medication list which includes the following prescription(s): alprostadil, aspirin, cyanocobalamin, diazepam, duloxetine, gabapentin, lamotrigine, nortriptyline, oxycodone hcl, sertraline, venlafaxine hcl, atorvastatin, oxycodone hcl, and oxycodone hcl, and the following Facility-Administered Medications: fentanyl, midazolam, and ropivacaine (pf) 2  mg/ml (0.2%). His primarily concern today is the Neck Pain (bilateral)  Initial Vital Signs:  Pulse Rate: (!) 106 Temp: 98.3 F (36.8 C) Resp: 20 BP: 134/81 SpO2: 100 %  BMI: Estimated body mass index is 23.57 kg/m as calculated from the following:   Height as of this encounter: 5\' 8"  (1.727 m).   Weight as of this encounter: 155 lb (70.3 kg).  Risk Assessment: Allergies: Reviewed. He has No Known Allergies.  Allergy Precautions: None required Coagulopathies: Reviewed. None identified.  Blood-thinner therapy: None at this time Active Infection(s): Reviewed. None identified. Mark Hodges is afebrile  Site Confirmation: Mark Hodges was asked to confirm the procedure and laterality before marking the site Procedure checklist: Completed Consent: Before the procedure and under the influence of no sedative(s), amnesic(s), or anxiolytics, the patient was informed of the treatment options, risks and possible complications. To fulfill our ethical and legal obligations, as recommended by the American Medical Association's Code of Ethics, I have informed the patient of my clinical impression; the nature and purpose of the treatment or procedure; the risks, benefits, and possible complications of the intervention; the alternatives, including doing nothing; the risk(s) and benefit(s) of the alternative treatment(s) or procedure(s); and the risk(s) and benefit(s) of doing nothing. The patient was provided information about the general risks and possible complications associated with the procedure. These may include, but are not limited to: failure to achieve desired goals, infection, bleeding, organ or nerve damage, allergic reactions, paralysis, and death. In addition, the patient was informed of those risks and complications associated to Spine-related procedures, such as failure to decrease pain; infection (i.e.: Meningitis, epidural or intraspinal abscess); bleeding (i.e.: epidural hematoma,  subarachnoid hemorrhage, or any other type of intraspinal or peri-dural bleeding); organ or nerve damage (i.e.: Any type of peripheral nerve, nerve root, or spinal cord injury) with subsequent damage to sensory, motor, and/or autonomic systems, resulting in permanent  pain, numbness, and/or weakness of one or several areas of the body; allergic reactions; (i.e.: anaphylactic reaction); and/or death. Furthermore, the patient was informed of those risks and complications associated with the medications. These include, but are not limited to: allergic reactions (i.e.: anaphylactic or anaphylactoid reaction(s)); adrenal axis suppression; blood sugar elevation that in diabetics may result in ketoacidosis or comma; water retention that in patients with history of congestive heart failure may result in shortness of breath, pulmonary edema, and decompensation with resultant heart failure; weight gain; swelling or edema; medication-induced neural toxicity; particulate matter embolism and blood vessel occlusion with resultant organ, and/or nervous system infarction; and/or aseptic necrosis of one or more joints. Finally, the patient was informed that Medicine is not an exact science; therefore, there is also the possibility of unforeseen or unpredictable risks and/or possible complications that may result in a catastrophic outcome. The patient indicated having understood very clearly. We have given the patient no guarantees and we have made no promises. Enough time was given to the patient to ask questions, all of which were answered to the patient's satisfaction. Mark Hodges has indicated that he wanted to continue with the procedure. Attestation: I, the ordering provider, attest that I have discussed with the patient the benefits, risks, side-effects, alternatives, likelihood of achieving goals, and potential problems during recovery for the procedure that I have provided informed consent. Date  Time: 11/23/2017 12:14  PM  Pre-Procedure Preparation:  Monitoring: As per clinic protocol. Respiration, ETCO2, SpO2, BP, heart rate and rhythm monitor placed and checked for adequate function Safety Precautions: Patient was assessed for positional comfort and pressure points before starting the procedure. Time-out: I initiated and conducted the "Time-out" before starting the procedure, as per protocol. The patient was asked to participate by confirming the accuracy of the "Time Out" information. Verification of the correct person, site, and procedure were performed and confirmed by me, the nursing staff, and the patient. "Time-out" conducted as per Joint Commission's Universal Protocol (UP.01.01.01). Time: 1303  Description of Procedure Process:   Position: Prone with head of the table was raised to facilitate breathing. Target Area: For Cervical Facet blocks, the target is the postero-lateral waist of the articular pillars at the C3, C4, C5, C6, & C7 levels. Approach: Posterior approach. Area Prepped: Entire Posterior Cervico-thoracic Region Prepping solution: ChloraPrep (2% chlorhexidine gluconate and 70% isopropyl alcohol) Safety Precautions: Aspiration looking for blood return was conducted prior to all injections. At no point did we inject any substances, as a needle was being advanced. No attempts were made at seeking any paresthesias. Safe injection practices and needle disposal techniques used. Medications properly checked for expiration dates. SDV (single dose vial) medications used. Description of the Procedure: Protocol guidelines were followed. The patient was placed in position over the fluoroscopy table. The target area was identified and the area prepped in the usual manner. Skin desensitized using vapocoolant spray. Skin & deeper tissues infiltrated with local anesthetic. Appropriate amount of time allowed to pass for local anesthetics to take effect. The procedure needle was introduced through the skin,  ipsilateral to the reported pain, and advanced to the target area. Bone was contacted on the posterior aspect of the articular pillars and the needle walked lateral, until the border was cleared. Lateral views taken to make sure the needle tip did not advance past the posterior third of the lateral mass of the posterior columns. The procedure was repeated in identical fashion for each level. Negative aspiration confirmed. Solution injected in intermittent  fashion, asking for systemic symptoms every 0.5cc of injectate. The needles were then removed and the area cleansed, making sure to leave some of the prepping solution back to take advantage of its long term bactericidal properties. Vitals:   11/23/17 1330 11/23/17 1340 11/23/17 1350 11/23/17 1400  BP: 135/85 134/88 115/86 125/84  Pulse: 100 100 98 (!) 103  Resp: 16 (!) 23 (!) 22 (!) 22  Temp: (!) 96.7 F (35.9 C)   98.6 F (37 C)  TempSrc:      SpO2: 99% 99% 95% 98%  Weight:      Height:        Start Time: 1303 hrs. End Time: 1321 hrs. Materials:  Needle(s) Type: Regular needle Gauge: 22G Length: 3.5-in Medication(s): Please see orders for medications and dosing details.  Imaging Guidance (Spinal):  Type of Imaging Technique: Fluoroscopy Guidance (Spinal) Indication(s): Assistance in needle guidance and placement for procedures requiring needle placement in or near specific anatomical locations not easily accessible without such assistance. Exposure Time: Please see nurses notes. Contrast: None used. Fluoroscopic Guidance: I was personally present during the use of fluoroscopy. "Tunnel Vision Technique" used to obtain the best possible view of the target area. Parallax error corrected before commencing the procedure. "Direction-depth-direction" technique used to introduce the needle under continuous pulsed fluoroscopy. Once target was reached, antero-posterior, oblique, and lateral fluoroscopic projection used confirm needle placement in  all planes. Images permanently stored in EMR. Interpretation: No contrast injected. I personally interpreted the imaging intraoperatively. Adequate needle placement confirmed in multiple planes. Permanent images saved into the patient's record.  Antibiotic Prophylaxis:   Anti-infectives (From admission, onward)   None     Indication(s): None identified  Post-operative Assessment:  Post-procedure Vital Signs:  Pulse Rate: (!) 103 Temp: 98.6 F (37 C) Resp: (!) 22 BP: 125/84 SpO2: 98 %  EBL: None  Complications: No immediate post-treatment complications observed by team, or reported by patient.  Note: The patient tolerated the entire procedure well. A repeat set of vitals were taken after the procedure and the patient was kept under observation following institutional policy, for this type of procedure. Post-procedural neurological assessment was performed, showing return to baseline, prior to discharge. The patient was provided with post-procedure discharge instructions, including a section on how to identify potential problems. Should any problems arise concerning this procedure, the patient was given instructions to immediately contact us, at any time, without hesitation. In any case, we plan to contact the patient by telephone for a follow-up status report regarding this interventional procedure.  Comments:  No additional relevant information.  Plan of Care   Imaging Orders     DG C-Arm 1-60 Min-No Report  Procedure Orders     CERVICAL FACET (MEDIAL BRANCH NERVE BLOCK)   Medications ordered for procedure: Meds ordered this encounter  Medications  . midazolam (VERSED) 5 MG/5ML injection 1-2 mg    Make sure Flumazenil is available in the pyxis when using this medication. If oversedation occurs, administer 0.2 mg IV over 15 sec. If after 45 sec no response, administer 0.2 mg again over 1 min; may repeat at 1 min intervals; not to exceed 4 doses (1 mg)  . fentaNYL (SUBLIMAZE)  injection 25-50 mcg    Make sure Narcan is available in the pyxis when using this medication. In the event of respiratory depression (RR< 8/min): Titrate NARCAN (naloxone) in increments of 0.1 to 0.2 mg IV at 2-3 minute intervals, until desired degree of reversal.  . lactated ringers  infusion 1,000 mL  . lidocaine (XYLOCAINE) 2 % (with pres) injection 200 mg  . ropivacaine (PF) 2 mg/mL (0.2%) (NAROPIN) injection 9 mL  . dexamethasone (DECADRON) injection 10 mg  . ropivacaine (PF) 2 mg/mL (0.2%) (NAROPIN) injection 9 mL  . dexamethasone (DECADRON) injection 10 mg   Medications administered: We administered midazolam, fentaNYL, lactated ringers, lidocaine, dexamethasone, ropivacaine (PF) 2 mg/mL (0.2%), and dexamethasone.  See the medical record for exact dosing, route, and time of administration.  New Prescriptions   No medications on file   Disposition: Discharge home  Discharge Date & Time: 11/23/2017; 1405 hrs.   Physician-requested Follow-up: Return for post-procedure eval (2 wks), w/ Dr. Dossie Arbour.  Future Appointments  Date Time Provider [place]  12/04/2017  9:30 AM Vevelyn Francois, NP [facility] None  12/13/2017  1:45 PM Milinda Pointer, MD [facility] None  02/22/2018 10:45 AM [facility] None  02/27/2018 10:15 AM Hollice Espy, MD [facility] None   Primary Care Physician: Tracie Harrier, MD Location: [facility] Note by: Gaspar Cola, MD Date: 11/23/2017; Time: 3:50 PM  Disclaimer:  Medicine is not an Chief Strategy Officer. The only guarantee in medicine is that nothing is guaranteed. It is important to note that the decision to proceed with this intervention was based on the information collected from the patient. The Data and conclusions were drawn from the patient's questionnaire, the interview, and the physical examination. Because the information was provided in large part by the patient, it cannot be guaranteed that it has not been  purposely or unconsciously manipulated. Every effort has been made to obtain as much relevant data as possible for this evaluation. It is important to note that the conclusions that lead to this procedure are derived in large part from the available data. Always take into account that the treatment will also be dependent on availability of resources and existing treatment guidelines, considered by other Pain Management Practitioners as being common knowledge and practice, at the time of the intervention. For Medico-Legal purposes, it is also important to point out that variation in procedural techniques and pharmacological choices are the acceptable norm. The indications, contraindications, technique, and results of the above procedure should only be interpreted and judged by a Board-Certified Interventional Pain Specialist with extensive familiarity and expertise in the same exact procedure and technique.

## 2017-11-24 ENCOUNTER — Telehealth: Payer: Self-pay

## 2017-11-24 NOTE — Telephone Encounter (Signed)
Post procedure phone call..patient states that he is doing good.

## 2017-12-04 ENCOUNTER — Encounter: Payer: Medicare Other | Admitting: Nurse Practitioner

## 2017-12-13 ENCOUNTER — Encounter: Payer: Self-pay | Admitting: Pain Medicine

## 2017-12-13 ENCOUNTER — Ambulatory Visit: Payer: Medicare Other | Attending: Pain Medicine | Admitting: Pain Medicine

## 2017-12-13 ENCOUNTER — Other Ambulatory Visit: Payer: Self-pay

## 2017-12-13 VITALS — BP 142/89 | HR 97 | Temp 98.3°F | Ht 68.0 in | Wt 155.0 lb

## 2017-12-13 DIAGNOSIS — E7800 Pure hypercholesterolemia, unspecified: Secondary | ICD-10-CM | POA: Insufficient documentation

## 2017-12-13 DIAGNOSIS — Z8546 Personal history of malignant neoplasm of prostate: Secondary | ICD-10-CM | POA: Insufficient documentation

## 2017-12-13 DIAGNOSIS — Z79891 Long term (current) use of opiate analgesic: Secondary | ICD-10-CM | POA: Diagnosis not present

## 2017-12-13 DIAGNOSIS — R270 Ataxia, unspecified: Secondary | ICD-10-CM | POA: Insufficient documentation

## 2017-12-13 DIAGNOSIS — M542 Cervicalgia: Secondary | ICD-10-CM

## 2017-12-13 DIAGNOSIS — M545 Low back pain: Secondary | ICD-10-CM | POA: Diagnosis not present

## 2017-12-13 DIAGNOSIS — F411 Generalized anxiety disorder: Secondary | ICD-10-CM | POA: Insufficient documentation

## 2017-12-13 DIAGNOSIS — G8929 Other chronic pain: Secondary | ICD-10-CM | POA: Diagnosis not present

## 2017-12-13 DIAGNOSIS — Z8673 Personal history of transient ischemic attack (TIA), and cerebral infarction without residual deficits: Secondary | ICD-10-CM | POA: Insufficient documentation

## 2017-12-13 DIAGNOSIS — G4486 Cervicogenic headache: Secondary | ICD-10-CM

## 2017-12-13 DIAGNOSIS — G950 Syringomyelia and syringobulbia: Secondary | ICD-10-CM | POA: Diagnosis not present

## 2017-12-13 DIAGNOSIS — N529 Male erectile dysfunction, unspecified: Secondary | ICD-10-CM | POA: Diagnosis not present

## 2017-12-13 DIAGNOSIS — F329 Major depressive disorder, single episode, unspecified: Secondary | ICD-10-CM | POA: Diagnosis not present

## 2017-12-13 DIAGNOSIS — Z7982 Long term (current) use of aspirin: Secondary | ICD-10-CM | POA: Diagnosis not present

## 2017-12-13 DIAGNOSIS — M25551 Pain in right hip: Secondary | ICD-10-CM | POA: Diagnosis not present

## 2017-12-13 DIAGNOSIS — M48061 Spinal stenosis, lumbar region without neurogenic claudication: Secondary | ICD-10-CM | POA: Diagnosis not present

## 2017-12-13 DIAGNOSIS — Z79899 Other long term (current) drug therapy: Secondary | ICD-10-CM | POA: Diagnosis not present

## 2017-12-13 DIAGNOSIS — R51 Headache: Secondary | ICD-10-CM

## 2017-12-13 DIAGNOSIS — M1610 Unilateral primary osteoarthritis, unspecified hip: Secondary | ICD-10-CM | POA: Insufficient documentation

## 2017-12-13 DIAGNOSIS — M47812 Spondylosis without myelopathy or radiculopathy, cervical region: Secondary | ICD-10-CM | POA: Diagnosis not present

## 2017-12-13 DIAGNOSIS — F17200 Nicotine dependence, unspecified, uncomplicated: Secondary | ICD-10-CM | POA: Insufficient documentation

## 2017-12-13 DIAGNOSIS — Z5181 Encounter for therapeutic drug level monitoring: Secondary | ICD-10-CM | POA: Insufficient documentation

## 2017-12-13 DIAGNOSIS — G894 Chronic pain syndrome: Secondary | ICD-10-CM

## 2017-12-13 MED ORDER — OXYCODONE HCL 10 MG PO TABS: 10.0000 mg | ORAL_TABLET | Freq: Three times a day (TID) | ORAL | 0 refills | Status: DC | PRN

## 2017-12-13 NOTE — Patient Instructions (Addendum)
____________________________________________________________________________________________  Preparing for Procedure with Sedation  Instructions: . Oral Intake: Do not eat or drink anything for at least 8 hours prior to your procedure. . Transportation: Public transportation is not allowed. Bring an adult driver. The driver must be physically present in our waiting room before any procedure can be started. . Physical Assistance: Bring an adult physically capable of assisting you, in the event you need help. This adult should keep you company at home for at least 6 hours after the procedure. . Blood Pressure Medicine: Take your blood pressure medicine with a sip of water the morning of the procedure. . Blood thinners:  . Diabetics on insulin: Notify the staff so that you can be scheduled 1st case in the morning. If your diabetes requires high dose insulin, take only  of your normal insulin dose the morning of the procedure and notify the staff that you have done so. . Preventing infections: Shower with an antibacterial soap the morning of your procedure. . Build-up your immune system: Take 1000 mg of Vitamin C with every meal (3 times a day) the day prior to your procedure. . Antibiotics: Inform the staff if you have a condition or reason that requires you to take antibiotics before dental procedures. . Pregnancy: If you are pregnant, call and cancel the procedure. . Sickness: If you have a cold, fever, or any active infections, call and cancel the procedure. . Arrival: You must be in the facility at least 30 minutes prior to your scheduled procedure. . Children: Do not bring children with you. . Dress appropriately: Bring dark clothing that you would not mind if they get stained. . Valuables: Do not bring any jewelry or valuables.  Procedure appointments are reserved for interventional treatments only. . No Prescription Refills. . No medication changes will be discussed during procedure  appointments. . No disability issues will be discussed.  Remember:  Regular Business hours are:  Monday to Thursday 8:00 AM to 4:00 PM  Provider's Schedule: Francisco Naveira, MD:  Procedure days: Tuesday and Thursday 7:30 AM to 4:00 PM  Bilal Lateef, MD:  Procedure days: Monday and Wednesday 7:30 AM to 4:00 PM ____________________________________________________________________________________________    

## 2017-12-13 NOTE — Progress Notes (Signed)
Patient's Name: Mark Hodges  MRN: [id number]  Referring Provider: Tracie Harrier, MD  DOB: 1956-05-04  PCP: Tracie Harrier, MD  DOS: 12/13/2017  Note by: Gaspar Cola, MD  Service setting: Ambulatory outpatient  Specialty: Interventional Pain Management  Location: [facility] (AMB) Pain Management Facility    Patient type: Established   Primary Reason(s) for Visit: Encounter for prescription drug management & post-procedure evaluation of chronic illness with mild to moderate exacerbation(Level of risk: moderate) CC: Neck Pain  HPI  Mark Hodges is a 62 y.o. year old, male patient, who comes today for a post-procedure evaluation and medication management. He has Difficulty in walking; Compulsive tobacco user syndrome; Hypercholesterolemia without hypertriglyceridemia; Long term current use of opiate analgesic; Long term prescription opiate use; Opiate use (15 MME/Day); Opiate dependence ([place]); Encounter for therapeutic drug level monitoring; Chronic low back pain (Primary Source of Pain) (Bilateral) (R>L); Chronic lumbar radicular pain (Polyradiculopathy) (Right); Ataxia; HLD (hyperlipidemia); Current tobacco use; Pure hypercholesterolemia; Arm numbness; Substance use disorder Risk: LOW; Chronic upper back pain; Chronic neck pain (Tertiary Area of Pain) (Bilateral) (R>L); Cervicogenic headache; Chronic radicular cervical pain; Chronic hip pain (Secondary source of pain) (Right); Generalized anxiety disorder; At high risk for falls; Erectile dysfunction; History of TIA (transient ischemic attack); 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 without associated mass lesion or cord expansion.; Abnormal MRI, lumbar spine (06/26/2015); Lumbar spondylosis; 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, by EMG/PNCV; Neuropathic pain; Chronic lower extremity pain (Right); History of prostate cancer; Lumbar facet arthropathy; 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); Cervical spondylosis; Spondylosis without myelopathy or radiculopathy, cervical region; and Cervicalgia on their problem list. His primarily concern today is the Neck Pain  Pain Assessment: Location: Lower Neck Radiating: Denies Onset: More than a month ago Duration: Chronic pain Quality: Sharp Severity: 7 /10 (self-reported pain score)  Note: Reported level is compatible with observation.                         When using our objective Pain Scale, levels between 6 and 10/10 are said to belong in an emergency room, as it progressively worsens from a 6/10, described as severely limiting, requiring emergency care not usually available at an outpatient pain management facility. At a 6/10 level, communication becomes difficult and requires great effort. Assistance to reach the emergency department may be required. Facial flushing and profuse sweating along with potentially dangerous increases in heart rate and blood pressure will be evident. Effect on ADL: unable to move head without pain Timing: Constant Modifying factors: heating pad  Mark Hodges was last seen on 11/23/2017 for a procedure. During today's appointment we reviewed Mark Hodges's post-procedure results, as well as his outpatient medication regimen.  Further details on both, my assessment(s), as well as the proposed treatment plan, please see below.  Controlled Substance Pharmacotherapy Assessment REMS (Risk Evaluation and Mitigation Strategy)  Analgesic: Oxycodone IR 10 mg 1 tablet p.o. every 8 hours (30  mg/day of oxycodone) (45 MME/day) MME/day: 45 mg/day.  No notes on file Pharmacokinetics: Liberation and absorption (onset of action): WNL Distribution (time to peak effect): WNL Metabolism and excretion (duration  of action): WNL         Pharmacodynamics: Desired effects: Analgesia: Mark Hodges reports >50% benefit. Functional ability: Patient reports that medication allows him to accomplish basic ADLs Clinically meaningful improvement in function (CMIF): Sustained CMIF goals met Perceived effectiveness: Described as relatively effective, allowing for increase in activities of daily living (ADL) Undesirable effects: Side-effects or Adverse reactions: None reported Monitoring: [place] PMP: Online review of the past 16-monthperiod conducted. Compliant with practice rules and regulations Last UDS on record: Summary  Date Value Ref Range Status  09/07/2017 FINAL  Final    Comment:    ==================================================================== TOXASSURE SELECT 13 (MW) ==================================================================== Test                             Result       Flag       Units Drug Present and Declared for Prescription Verification   Desmethyldiazepam              223          EXPECTED   ng/mg creat   Oxazepam                       750          EXPECTED   ng/mg creat   Temazepam                      350          EXPECTED   ng/mg creat    Desmethyldiazepam, oxazepam, and temazepam are expected    metabolites of diazepam. Desmethyldiazepam and oxazepam are also    expected metabolites of other drugs, including chlordiazepoxide,    prazepam, clorazepate, and halazepam. Oxazepam is an expected    metabolite of temazepam. Oxazepam and temazepam are also    available as scheduled prescription medications.   Oxycodone                      367          EXPECTED   ng/mg creat   Oxymorphone                    540          EXPECTED   ng/mg creat   Noroxycodone                    870          EXPECTED   ng/mg creat   Noroxymorphone                 363          EXPECTED   ng/mg creat    Sources of oxycodone are scheduled prescription medications.    Oxymorphone, noroxycodone, and noroxymorphone are expected    metabolites of oxycodone. Oxymorphone is also available as a    scheduled prescription medication. ==================================================================== Test                      Result    Flag   Units      Ref Range   Creatinine              30               mg/dL      >=20 ==================================================================== Declared Medications:  The flagging and interpretation  on this report are based on the  following declared medications.  Unexpected results may arise from  inaccuracies in the declared medications.  **Note: The testing scope of this panel includes these medications:  Diazepam  Oxycodone  **Note: The testing scope of this panel does not include following  reported medications:  Alprostadil  Aspirin  Atorvastatin  Cyanocobalamin  Duloxetine  Gabapentin  Lamotrigine  Nortriptyline  Sertraline  Venlafaxine ==================================================================== For clinical consultation, please call [phone]. ====================================================================    UDS interpretation: Compliant Patient reminded of the CDC guidelines recommending to stay away from the sedatives & benzodiazepines due to the risk of respiratory depression and death. Medication Assessment Form: Reviewed. Patient indicates being compliant with therapy Treatment compliance: Compliant Risk Assessment Profile: Aberrant behavior: See prior evaluations. None observed or detected today Comorbid factors increasing risk of overdose: See prior notes. No additional risks detected today Risk of substance use disorder (SUD): Low Opioid Risk Tool - 11/23/17 1221      Family History of  Substance Abuse   Alcohol  Positive Male    Illegal Drugs  Negative    Rx Drugs  Negative      Personal History of Substance Abuse   Alcohol  Negative    Illegal Drugs  Negative    Rx Drugs  Negative      Age   Age between 27-45 years   No      History of Preadolescent Sexual Abuse   History of Preadolescent Sexual Abuse  Negative or Male      Psychological Disease   Psychological Disease  Negative    Depression  Positive      Total Score   Opioid Risk Tool Scoring  4    Opioid Risk Interpretation  Moderate Risk      ORT Scoring interpretation table:  Score <3 = Low Risk for SUD  Score between 4-7 = Moderate Risk for SUD  Score >8 = High Risk for Opioid Abuse   Risk Mitigation Strategies:  Patient Counseling: Covered Patient-Prescriber Agreement (PPA): Present and active  Notification to other healthcare providers: Done  Pharmacologic Plan: No change in therapy, at this time.             Post-Procedure Assessment  11/23/2017 Procedure: Diagnostic bilateral cervical facet block #3 under fluoroscopic guidance and IV sedation Pre-procedure pain score:  5/10 Post-procedure pain score: 0/10 (100% relief) Influential Factors: BMI: 23.57 kg/m Intra-procedural challenges: None observed.         Assessment challenges: None detected.              Reported side-effects: None.        Post-procedural adverse reactions or complications: None reported         Sedation: Sedation provided. When no sedatives are used, the analgesic levels obtained are directly associated to the effectiveness of the local anesthetics. However, when sedation is provided, the level of analgesia obtained during the initial 1 hour following the intervention, is believed to be the result of a combination of factors. These factors may include, but are not limited to: 1. The effectiveness of the local anesthetics used. 2. The effects of the analgesic(s) and/or anxiolytic(s) used. 3. The degree of discomfort  experienced by the patient at the time of the procedure. 4. The patients ability and reliability in recalling and recording the events. 5. The presence and influence of possible secondary gains and/or psychosocial factors. Reported result: Relief experienced during the 1st hour after the procedure: 100 % (  Ultra-Short Term Relief)            Interpretative annotation: Clinically appropriate result. Analgesia during this period is likely to be Local Anesthetic and/or IV Sedative (Analgesic/Anxiolytic) related.          Effects of local anesthetic: The analgesic effects attained during this period are directly associated to the localized infiltration of local anesthetics and therefore cary significant diagnostic value as to the etiological location, or anatomical origin, of the pain. Expected duration of relief is directly dependent on the pharmacodynamics of the local anesthetic used. Long-acting (4-6 hours) anesthetics used.  Reported result: Relief during the next 4 to 6 hour after the procedure: 100 % (Short-Term Relief)            Interpretative annotation: Clinically appropriate result. Analgesia during this period is likely to be Local Anesthetic-related.          Long-term benefit: Defined as the period of time past the expected duration of local anesthetics (1 hour for short-acting and 4-6 hours for long-acting). With the possible exception of prolonged sympathetic blockade from the local anesthetics, benefits during this period are typically attributed to, or associated with, other factors such as analgesic sensory neuropraxia, antiinflammatory effects, or beneficial biochemical changes provided by agents other than the local anesthetics.  Reported result: Extended relief following procedure: 40 % (Long-Term Relief)            Interpretative annotation: Clinically appropriate result. Good relief. No permanent benefit expected. Inflammation plays a part in the etiology to the pain.           Current benefits: Defined as reported results that persistent at this point in time.   Analgesia: <50 %            Function: Somewhat improved ROM: Somewhat improved Interpretative annotation: Recurrence of symptoms. No permanent benefit expected. Effective diagnostic intervention.          Interpretation: Results would suggest a successful diagnostic intervention.         Mark Hodges indicates having had an unsuccessful trial of physical therapy, which he described as non-beneficial and painful.  Plan:  Proceed with Radiofrequency Ablation for the purpose of attaining long-term benefits.       "The patient has failed to respond to conservative therapies including over-the-counter medications, anti-inflammatories, muscle relaxants, membrane stabilizers, opioids, physical therapy modalities such as heat and ice, as well as more invasive techniques such as nerve blocks. Because Mark Hodges did attain more than 50% relief of the pain during a series of diagnostic blocks conducted in separate occasions, I believe it is medically necessary to proceed with Radiofrequency Ablation, in order to attempt gaining longer relief.  Laboratory Chemistry  Inflammation Markers (CRP: Acute Phase) (ESR: Chronic Phase) Lab Results  Component Value Date   CRP 1.5 04/19/2017   ESRSEDRATE 2 04/19/2017                         Renal Function Markers Lab Results  Component Value Date   BUN 9 04/19/2017   CREATININE 0.82 04/19/2017   GFRAA 113 04/19/2017   GFRNONAA 97 04/19/2017                 Hepatic Function Markers Lab Results  Component Value Date   AST 20 04/19/2017   ALT 18 04/19/2017   ALBUMIN 5.1 04/19/2017   ALKPHOS 109 04/19/2017  Electrolytes Lab Results  Component Value Date   NA 140 04/19/2017   K 4.7 04/19/2017   CL 101 04/19/2017   CALCIUM 10.5 (H) 04/19/2017   MG 2.3 04/19/2017                        Neuropathy Markers Lab Results  Component Value Date    VITAMINB12 1,147 04/19/2017                 Bone Pathology Markers Lab Results  Component Value Date   25OHVITD1 55 04/19/2017   25OHVITD2 <1.0 04/19/2017   25OHVITD3 54 04/19/2017                         Note: Lab results reviewed.  Recent Diagnostic Imaging Results  DG C-Arm 1-60 Min-No Report Fluoroscopy was utilized by the requesting physician.  No radiographic  interpretation.   Complexity Note: I personally reviewed the fluoroscopic imaging of the procedure.                        Meds   Current Outpatient Medications:  .  alprostadil (CAVERJECT IMPULSE) 10 MCG injection, 10 mcg by Intracavitary route as needed for erectile dysfunction. use no more than 3 times per week (Patient taking differently: 10 mcg by Intracavitary route as needed for erectile dysfunction. use no more than 3 times per week), Disp: 3 each, Rfl: 3 .  aspirin 325 MG tablet, Take 325 mg by mouth daily. , Disp: , Rfl:  .  cyanocobalamin (,VITAMIN B-12,) 1000 MCG/ML injection, Inject 1,000 mcg into the muscle every 30 (thirty) days. , Disp: , Rfl:  .  diazepam (VALIUM) 5 MG tablet, TAKE 1 TABLET BY MOUTH TWICE A DAY AS NEEDED FOR ANXIETY, Disp: , Rfl: 2 .  DULoxetine (CYMBALTA) 30 MG capsule, 30 mg daily. , Disp: , Rfl:  .  gabapentin (NEURONTIN) 300 MG capsule, Take 300 mg by mouth 3 (three) times daily. , Disp: , Rfl:  .  lamoTRIgine (LAMICTAL) 150 MG tablet, 150 mg 2 (two) times daily. , Disp: , Rfl:  .  nortriptyline (PAMELOR) 10 MG capsule, Take 1 capsule at bedtime in addition to one 50 mg capsule to equal 60 mg at night, Disp: , Rfl:  .  [START ON 02/11/2018] Oxycodone HCl 10 MG TABS, Take 1 tablet (10 mg total) by mouth every 8 (eight) hours as needed., Disp: 90 tablet, Rfl: 0 .  [START ON 01/12/2018] Oxycodone HCl 10 MG TABS, Take 1 tablet (10 mg total) by mouth every 8 (eight) hours as needed., Disp: 90 tablet, Rfl: 0 .  Oxycodone HCl 10 MG TABS, Take 1 tablet (10 mg total) by mouth every 8 (eight)  hours as needed., Disp: 90 tablet, Rfl: 0 .  sertraline (ZOLOFT) 50 MG tablet, Take 50 mg by mouth daily., Disp: , Rfl:  .  Venlafaxine HCl 225 MG TB24, Take 2 tablets by mouth daily. , Disp: , Rfl:  .  atorvastatin (LIPITOR) 20 MG tablet, Take 20 mg by mouth daily at 6 PM. , Disp: , Rfl:   ROS  Constitutional: Denies any fever or chills Gastrointestinal: No reported hemesis, hematochezia, vomiting, or acute GI distress Musculoskeletal: Denies any acute onset joint swelling, redness, loss of ROM, or weakness Neurological: No reported episodes of acute onset apraxia, aphasia, dysarthria, agnosia, amnesia, paralysis, loss of coordination, or loss of consciousness  Allergies  Mark Hodges  has No Known Allergies.  PFSH  Drug: Mark Hodges  reports that he does not use drugs. Alcohol:  reports that he does not drink alcohol. Tobacco:  reports that he has been smoking.  His smokeless tobacco use includes chew. Medical:  has a past medical history of Arthritis, BPH (benign prostatic hyperplasia), Clinical depression (10/23/2014), Depression, Erectile dysfunction, Hyperlipidemia, Hypogonadism in male, Lower urinary tract infection, Premature ejaculation, Prostate cancer ([place]), Stroke ([place]), and TIA (transient ischemic attack). Surgical: Mark Hodges  has a past surgical history that includes Tonsillectomy; Colonoscopy with propofol (N/A, 01/26/2016); and Esophagogastroduodenoscopy (egd) with propofol (N/A, 01/26/2016). Family: family history includes Heart disease in his father.  Constitutional Exam  General appearance: Well nourished, well developed, and well hydrated. In no apparent acute distress Vitals:   12/13/17 1405  BP: (!) 142/89  Pulse: 97  Temp: 98.3 F (36.8 C)  SpO2: 100%  Weight: 155 lb (70.3 kg)  Height: 5' 8"  (1.727 m)   BMI Assessment: Estimated body mass index is 23.57 kg/m as calculated from the following:   Height as of this encounter: 5' 8"  (1.727 m).   Weight as of this  encounter: 155 lb (70.3 kg).  BMI interpretation table: BMI level Category Range association with higher incidence of chronic pain  <18 kg/m2 Underweight   18.5-24.9 kg/m2 Ideal body weight   25-29.9 kg/m2 Overweight Increased incidence by 20%  30-34.9 kg/m2 Obese (Class I) Increased incidence by 68%  35-39.9 kg/m2 Severe obesity (Class II) Increased incidence by 136%  >40 kg/m2 Extreme obesity (Class III) Increased incidence by 254%   BMI Readings from Last 4 Encounters:  12/13/17 23.57 kg/m  11/23/17 23.57 kg/m  09/07/17 23.57 kg/m  08/25/17 23.57 kg/m   Wt Readings from Last 4 Encounters:  12/13/17 155 lb (70.3 kg)  11/23/17 155 lb (70.3 kg)  09/07/17 155 lb (70.3 kg)  08/25/17 155 lb (70.3 kg)  Psych/Mental status: Alert, oriented x 3 (person, place, & time)       Eyes: PERLA Respiratory: No evidence of acute respiratory distress  Cervical Spine Area Exam  Skin & Axial Inspection: No masses, redness, edema, swelling, or associated skin lesions Alignment: Symmetrical Functional ROM: Improved after treatment, but still decreased, bilaterally. Stability: No instability detected Muscle Tone/Strength: Functionally intact. No obvious neuro-muscular anomalies detected. Sensory (Neurological): Movement-associated discomfort Palpation: Complains of area being tender to palpation              Upper Extremity (UE) Exam    Side: Right upper extremity  Side: Left upper extremity  Skin & Extremity Inspection: Skin color, temperature, and hair growth are WNL. No peripheral edema or cyanosis. No masses, redness, swelling, asymmetry, or associated skin lesions. No contractures.  Skin & Extremity Inspection: Skin color, temperature, and hair growth are WNL. No peripheral edema or cyanosis. No masses, redness, swelling, asymmetry, or associated skin lesions. No contractures.  Functional ROM: Unrestricted ROM          Functional ROM: Unrestricted ROM          Muscle Tone/Strength:  Functionally intact. No obvious neuro-muscular anomalies detected.  Muscle Tone/Strength: Functionally intact. No obvious neuro-muscular anomalies detected.  Sensory (Neurological): Unimpaired          Sensory (Neurological): Unimpaired          Palpation: No palpable anomalies              Palpation: No palpable anomalies  Specialized Test(s): Deferred         Specialized Test(s): Deferred          Thoracic Spine Area Exam  Skin & Axial Inspection: No masses, redness, or swelling Alignment: Symmetrical Functional ROM: Unrestricted ROM Stability: No instability detected Muscle Tone/Strength: Functionally intact. No obvious neuro-muscular anomalies detected. Sensory (Neurological): Unimpaired Muscle strength & Tone: No palpable anomalies  Lumbar Spine Area Exam  Skin & Axial Inspection: No masses, redness, or swelling Alignment: Symmetrical Functional ROM: Decreased ROM      Stability: No instability detected Muscle Tone/Strength: Functionally intact. No obvious neuro-muscular anomalies detected. Sensory (Neurological): Movement-associated discomfort Palpation: No palpable anomalies       Provocative Tests: Lumbar Hyperextension and rotation test: Positive bilaterally for facet joint pain. Lumbar Lateral bending test: evaluation deferred today       Patrick's Maneuver: evaluation deferred today                    Gait & Posture Assessment  Ambulation: Patient ambulates using a cane Gait: Limited. Using assistive device to ambulate Posture: Antalgic   Lower Extremity Exam    Side: Right lower extremity  Side: Left lower extremity  Skin & Extremity Inspection: Skin color, temperature, and hair growth are WNL. No peripheral edema or cyanosis. No masses, redness, swelling, asymmetry, or associated skin lesions. No contractures.  Skin & Extremity Inspection: Skin color, temperature, and hair growth are WNL. No peripheral edema or cyanosis. No masses, redness, swelling,  asymmetry, or associated skin lesions. No contractures.  Functional ROM: Unrestricted ROM          Functional ROM: Unrestricted ROM          Muscle Tone/Strength: Functionally intact. No obvious neuro-muscular anomalies detected.  Muscle Tone/Strength: Functionally intact. No obvious neuro-muscular anomalies detected.  Sensory (Neurological): Unimpaired  Sensory (Neurological): Unimpaired  Palpation: No palpable anomalies  Palpation: No palpable anomalies   Assessment  Primary Diagnosis & Pertinent Problem List: The primary encounter diagnosis was Chronic neck pain (Tertiary Area of Pain) (Bilateral) (R>L). Diagnoses of Chronic low back pain (Primary Source of Pain) (Bilateral) (R>L), Chronic hip pain (Secondary source of pain) (Right), Cervicogenic headache, Chronic pain syndrome, Cervicalgia, and Spondylosis without myelopathy or radiculopathy, cervical region were also pertinent to this visit.  Status Diagnosis  Persistent Recurring Controlled 1. Chronic neck pain (Tertiary Area of Pain) (Bilateral) (R>L)   2. Chronic low back pain (Primary Source of Pain) (Bilateral) (R>L)   3. Chronic hip pain (Secondary source of pain) (Right)   4. Cervicogenic headache   5. Chronic pain syndrome   6. Cervicalgia   7. Spondylosis without myelopathy or radiculopathy, cervical region     Problems updated and reviewed during this visit: Problem  Ataxia   Tremor, headaches, and difficulty walking elevated by Dr. Gurney Maxin (Board certified in neurology).    Plan of Care  Pharmacotherapy (Medications Ordered): Meds ordered this encounter  Medications  . Oxycodone HCl 10 MG TABS    Sig: Take 1 tablet (10 mg total) by mouth every 8 (eight) hours as needed.    Dispense:  90 tablet    Refill:  0    Do not place this medication, or any other prescription from our practice, on "Automatic Refill". Patient may have prescription filled one day early if pharmacy is closed on scheduled refill date. Do  not fill until: 02/11/18 To last until: 03/13/18  . Oxycodone HCl 10 MG TABS    Sig: Take 1  tablet (10 mg total) by mouth every 8 (eight) hours as needed.    Dispense:  90 tablet    Refill:  0    Do not place this medication, or any other prescription from our practice, on "Automatic Refill". Patient may have prescription filled one day early if pharmacy is closed on scheduled refill date. Do not fill until: 01/12/18 To last until: 02/11/18  . Oxycodone HCl 10 MG TABS    Sig: Take 1 tablet (10 mg total) by mouth every 8 (eight) hours as needed.    Dispense:  90 tablet    Refill:  0    Do not place this medication, or any other prescription from our practice, on "Automatic Refill". Patient may have prescription filled one day early if pharmacy is closed on scheduled refill date. Do not fill until: 12/13/17 To last until: 01/12/18   Medications administered today: Lajean Manes had no medications administered during this visit.   Procedure Orders     Radiofrequency,Cervical Lab Orders  No laboratory test(s) ordered today   Imaging Orders  No imaging studies ordered today   Referral Orders  No referral(s) requested today    Interventional management options: Planned, scheduled, and/or pending:   Therapeutic right-sided cervical facet RFA under fluoroscopic guidance and IV sedation    Considering:   Diagnostic bilateral greater occipital nerve block Possible bilateral greater occipital nerve RFA Possible bilateral occipital peripheral nerve stimulator trial Diagnostic right-sided cervical epidural steroid injection Diagnostic bilateral cervical facet block Possible bilateral cervical facet RFA Palliative bilateral lumbar facetblock  Palliative bilateral lumbar facet RFA(right: 08/10/2016; left:12/19/2016) Palliative right intra-articular hipjoint injection  Diagnostic right-sided femoral nerve + obturator nerve block Possible right-sided femoral nerve +  obturator nerve RFA Diagnostic bilateral L3-4 transforaminal epiduralsteroid injection  Diagnostic midline T12-L1 lumbar epiduralsteroid injection    Palliative PRN treatment(s):   Diagnostic bilateral greater occipital nerve block Diagnostic bilateral cervical facet block Palliative bilateral lumbar facetblock  Palliative right intra-articular hipjoint injection  Diagnostic bilateral L3-4 transforaminal epiduralsteroid injection  Diagnostic midline T12-L1 lumbar epiduralsteroid injection    Provider-requested follow-up: Return for RFA (fluoro + sedation): (R) C-FCT RFA.  Future Appointments  Date Time Provider [place]  02/01/2018 10:30 AM Milinda Pointer, MD [facility] None  02/22/2018 10:45 AM [facility] None  02/27/2018 10:15 AM Hollice Espy, MD [facility] None  03/05/2018 12:45 PM Vevelyn Francois, NP [facility] None   Primary Care Physician: Tracie Harrier, MD Location: [facility] Outpatient Pain Management Facility Note by: Gaspar Cola, MD Date: 12/13/2017; Time: 1:09 PM

## 2018-01-03 IMAGING — CR DG CHEST 2V
1 series · 2 of 2 positions shown · non-contrast
Comparison: PA and lateral chest x-ray January 26, 2006

CLINICAL DATA: Shortness of breath. History of COPD. Former smoker.

EXAM:
CHEST  2 VIEW

[Series 1: dg chest 2 view · 0.14mm/px · 2 of 2 slices shown]
[im 1/2]
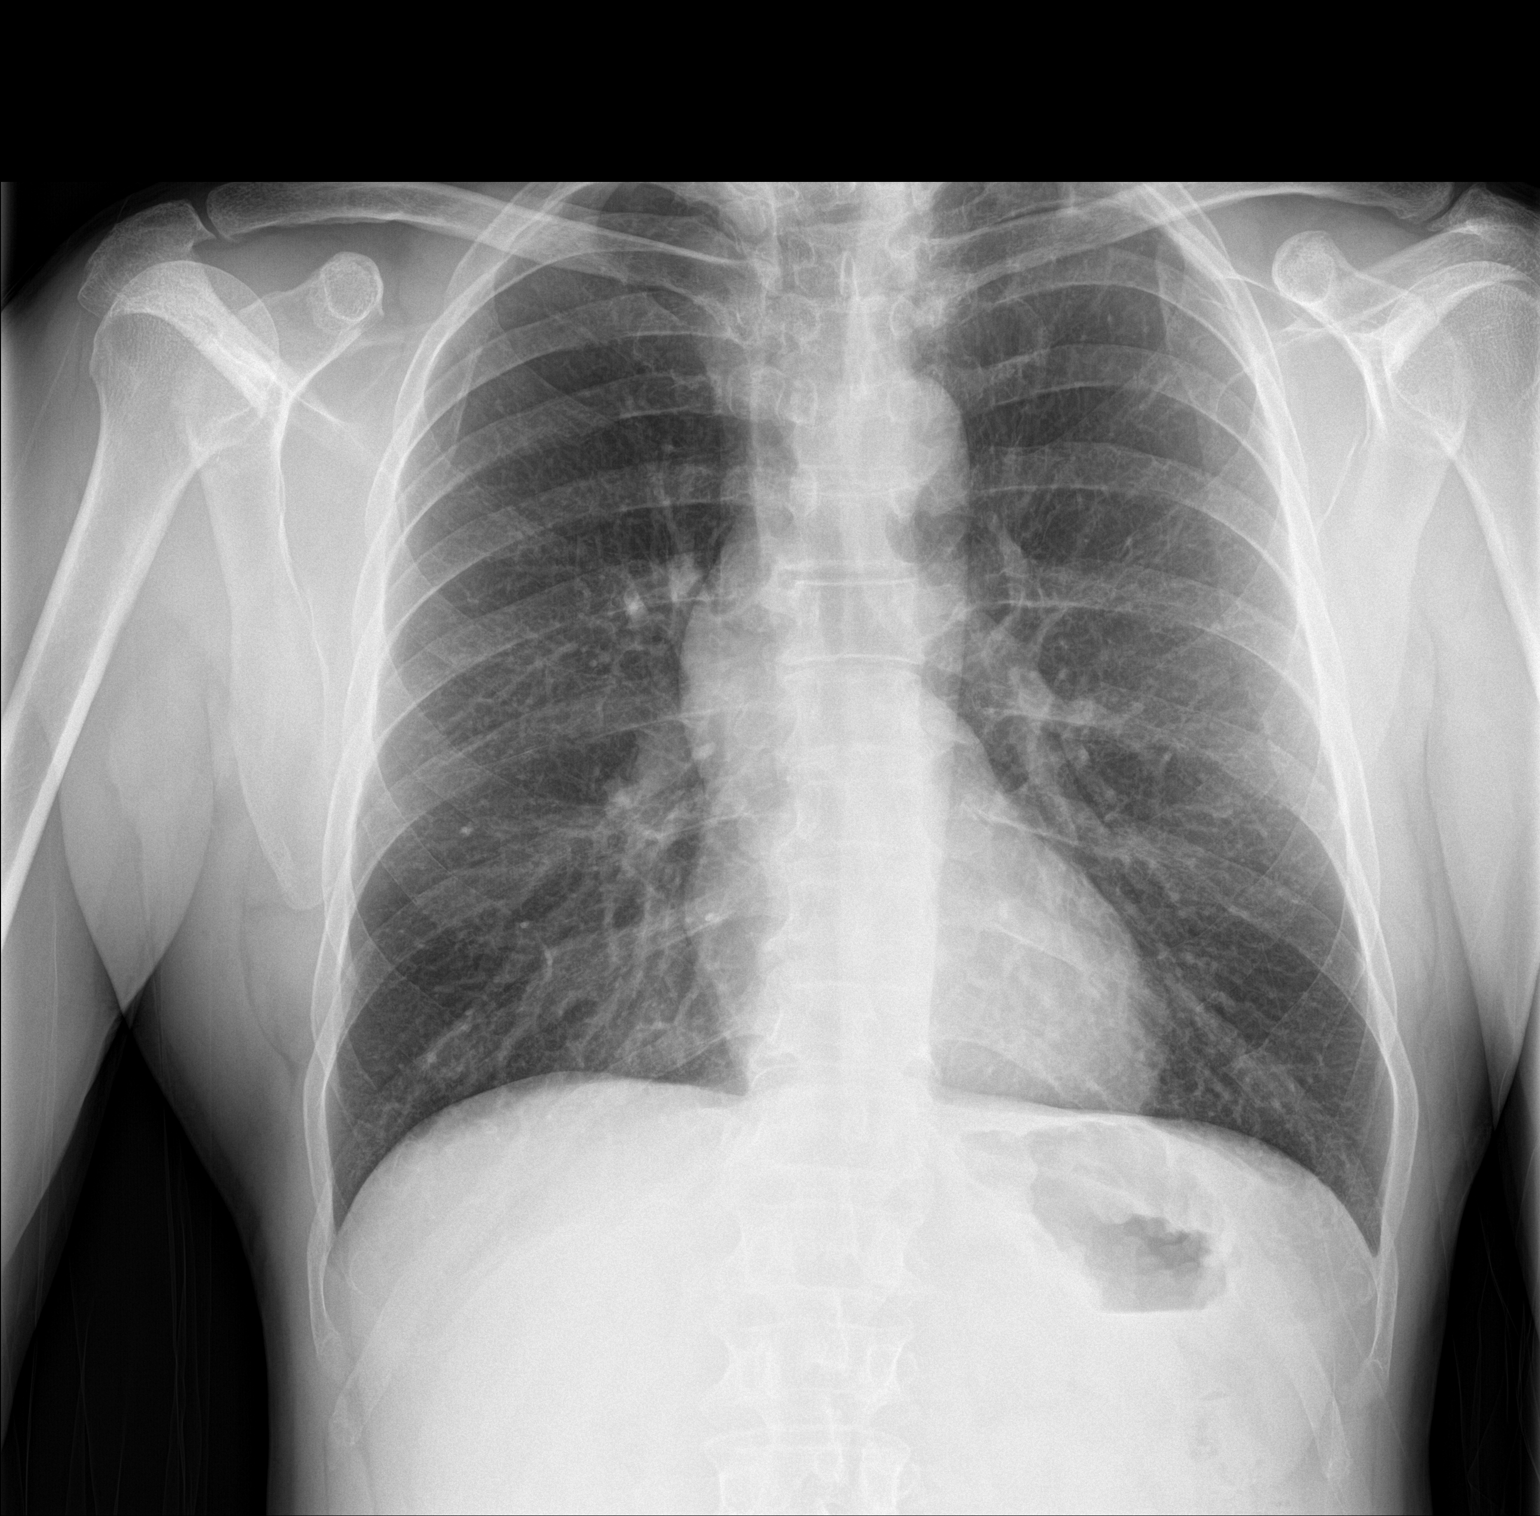
[im 2/2]
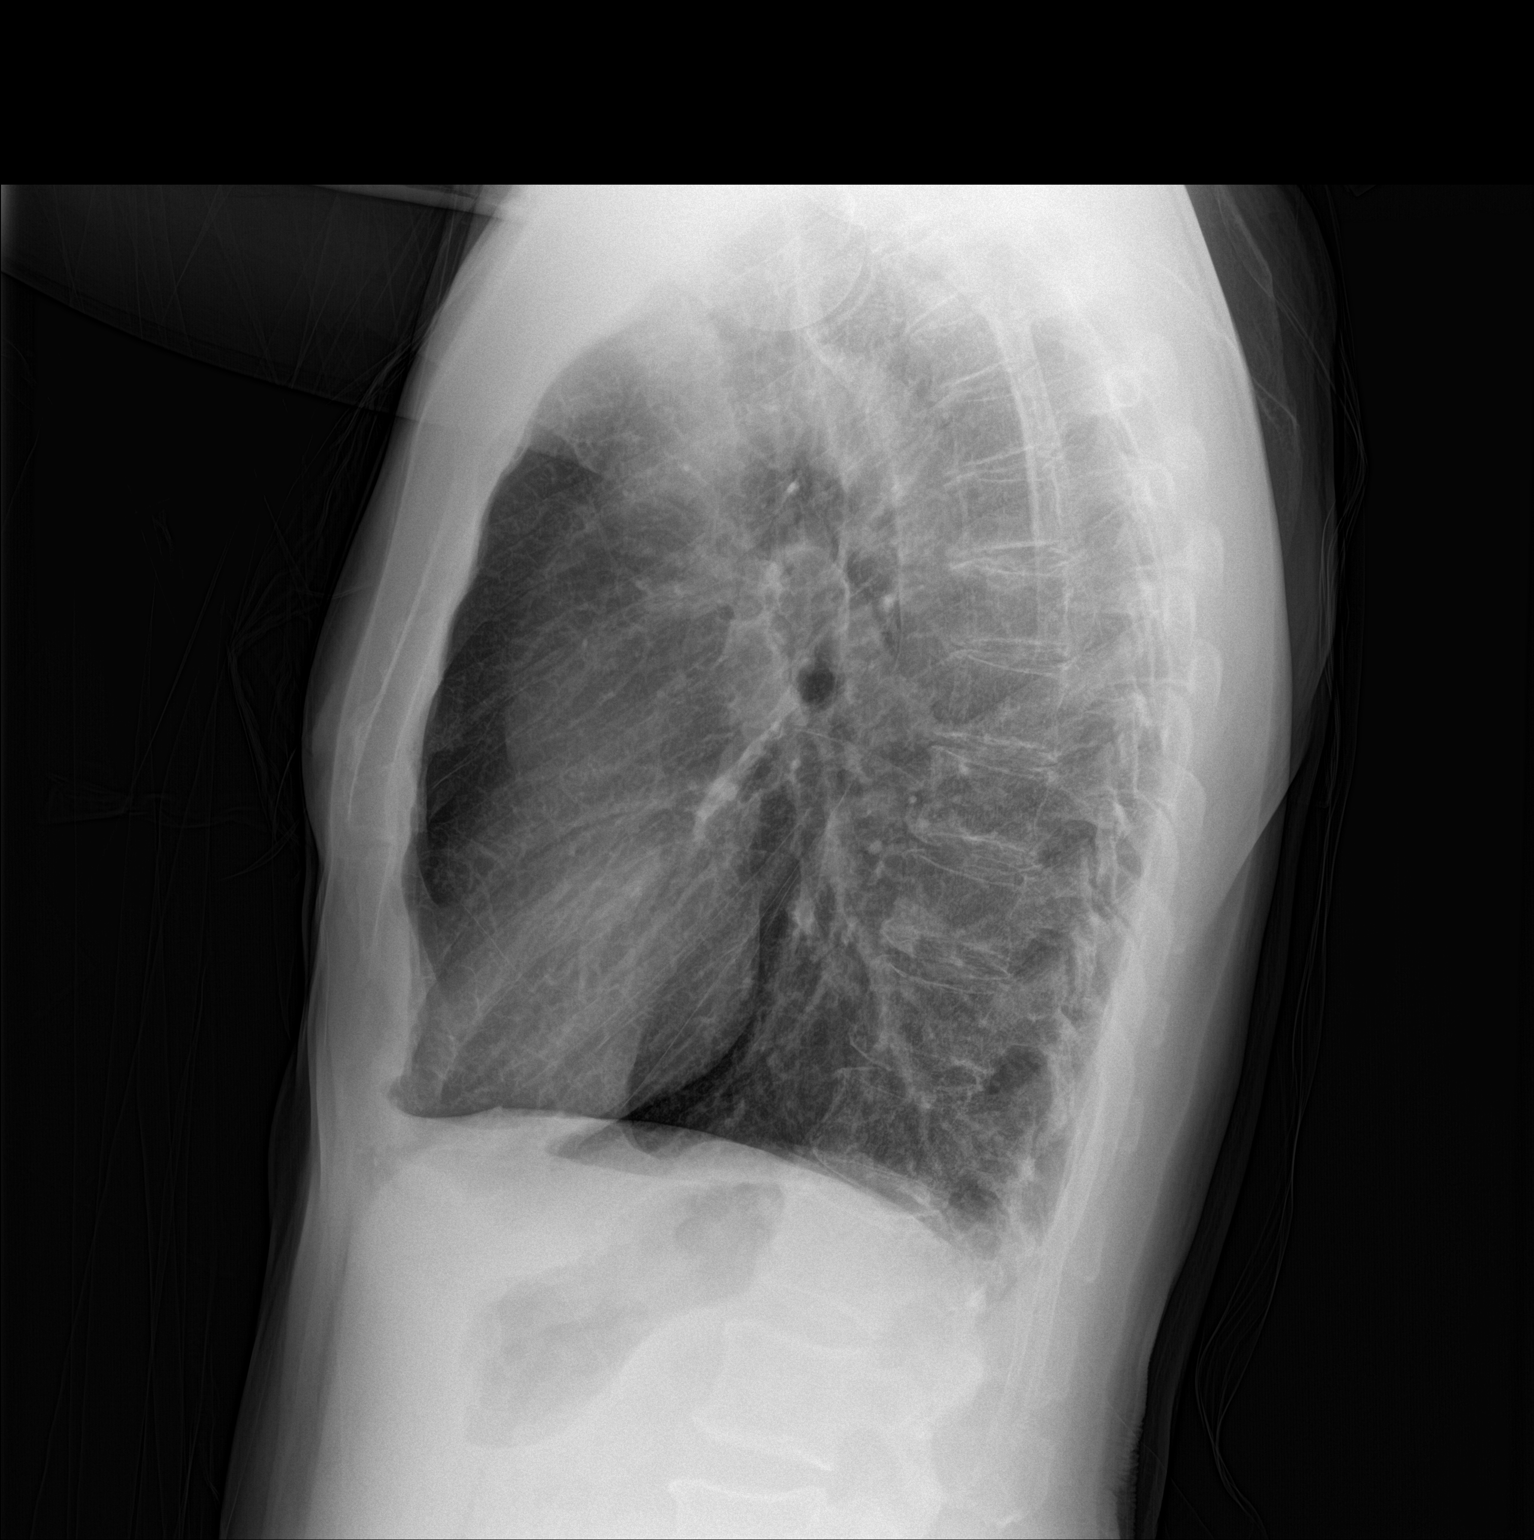

[2 of 2 positions shown; findings below may reference images not displayed]

FINDINGS: The lungs remain hyperinflated with hemidiaphragm flattening. There
is no focal infiltrate. There is no pleural effusion. The heart and
pulmonary vascularity are normal. The mediastinum is normal in
width. The bony thorax exhibits no acute abnormality.
IMPRESSION: COPD.  There is no active cardiopulmonary disease.

## 2018-02-01 ENCOUNTER — Ambulatory Visit: Payer: Medicare Other | Admitting: Pain Medicine

## 2018-02-01 NOTE — Progress Notes (Deleted)
Patient's Name: Mark Hodges  MRN: [id number]  Referring Provider: Tracie Harrier, MD  DOB: 04/30/1956  PCP: Tracie Harrier, MD  DOS: 02/01/2018  Note by: Gaspar Cola, MD  Service setting: Ambulatory outpatient  Specialty: Interventional Pain Management  Patient type: Established  Location: [facility] (AMB) Pain Management Facility  Visit type: Interventional Procedure   Primary Reason for Visit: Interventional Pain Management Treatment. CC: No chief complaint on file.  Procedure:       Anesthesia, Analgesia, Anxiolysis:  Type: Cervical Facet, Medial Branch Radiofrequency Ablation Primary Purpose: Therapeutic Region: Posterolateral cervical spine region Level: C3, C4, C5, C6, & C7 Medial Branch Level(s). Lesioning of these levels should completely denervate the C3-4, C4-5, C5-6, and the C6-7 cervical facet joints. Laterality: Right Paraspinal  Type: Moderate (Conscious) Sedation combined with Local Anesthesia Indication(s): Analgesia and Anxiety Route: Intravenous (IV) IV Access: Secured Sedation: Meaningful verbal contact was maintained at all times during the procedure  Local Anesthetic: Lidocaine 1-2%  Position: Prone with head of the table was raised to facilitate breathing.   Indications: 1. Spondylosis without myelopathy or radiculopathy, cervical region   2. Cervical facet syndrome (HCC) (Bilateral) (R>L)   3. Cervical spondylosis   4. Cervicalgia   5. Cervicogenic headache   6. 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 Bevens has been dealing with the above chronic pain for longer than three months and has either failed to respond, was unable to tolerate, or simply did not get enough benefit from other more conservative therapies including, but not limited to: 1. Over-the-counter medications 2. Anti-inflammatory medications 3. Muscle relaxants 4. Membrane stabilizers 5. Opioids 6. Physical therapy 7. Modalities (Heat, ice, etc.) 8.  Invasive techniques such as nerve blocks. Mr. Majano has attained more than 50% relief of the pain from a series of diagnostic injections conducted in separate occasions.  Pain Score: Pre-procedure:  /10 Post-procedure:  /10  Pre-op Assessment:  Mr. Dipaola is a 62 y.o. (year old), male patient, seen today for interventional treatment. He  has a past surgical history that includes Tonsillectomy; Colonoscopy with propofol (N/A, 01/26/2016); and Esophagogastroduodenoscopy (egd) with propofol (N/A, 01/26/2016). Mr. Lachney has a current medication list which includes the following prescription(s): alprostadil, aspirin, atorvastatin, cyanocobalamin, diazepam, duloxetine, gabapentin, lamotrigine, nortriptyline, oxycodone hcl, oxycodone hcl, oxycodone hcl, sertraline, and venlafaxine hcl. His primarily concern today is the No chief complaint on file.  Initial Vital Signs:  Pulse/HCG Rate:    Temp:   Resp:   BP:   SpO2:    BMI: Estimated body mass index is 23.57 kg/m as calculated from the following:   Height as of 12/13/17: 5\' 8"  (1.727 m).   Weight as of 12/13/17: 155 lb (70.3 kg).  Risk Assessment: Allergies: Reviewed. He has No Known Allergies.  Allergy Precautions: None required Coagulopathies: Reviewed. None identified.  Blood-thinner therapy: None at this time Active Infection(s): Reviewed. None identified. Mr. Montz is afebrile  Site Confirmation: Mr. Kushnir was asked to confirm the procedure and laterality before marking the site Procedure checklist: Completed Consent: Before the procedure and under the influence of no sedative(s), amnesic(s), or anxiolytics, the patient was informed of the treatment options, risks and possible complications. To fulfill our ethical and legal obligations, as recommended by the American Medical Association's Code of Ethics, I have informed the patient of my clinical impression; the nature and purpose of the treatment or procedure; the risks, benefits, and  possible complications of the intervention; the alternatives, including doing  nothing; the risk(s) and benefit(s) of the alternative treatment(s) or procedure(s); and the risk(s) and benefit(s) of doing nothing. The patient was provided information about the general risks and possible complications associated with the procedure. These may include, but are not limited to: failure to achieve desired goals, infection, bleeding, organ or nerve damage, allergic reactions, paralysis, and death. In addition, the patient was informed of those risks and complications associated to Spine-related procedures, such as failure to decrease pain; infection (i.e.: Meningitis, epidural or intraspinal abscess); bleeding (i.e.: epidural hematoma, subarachnoid hemorrhage, or any other type of intraspinal or peri-dural bleeding); organ or nerve damage (i.e.: Any type of peripheral nerve, nerve root, or spinal cord injury) with subsequent damage to sensory, motor, and/or autonomic systems, resulting in permanent pain, numbness, and/or weakness of one or several areas of the body; allergic reactions; (i.e.: anaphylactic reaction); and/or death. Furthermore, the patient was informed of those risks and complications associated with the medications. These include, but are not limited to: allergic reactions (i.e.: anaphylactic or anaphylactoid reaction(s)); adrenal axis suppression; blood sugar elevation that in diabetics may result in ketoacidosis or comma; water retention that in patients with history of congestive heart failure may result in shortness of breath, pulmonary edema, and decompensation with resultant heart failure; weight gain; swelling or edema; medication-induced neural toxicity; particulate matter embolism and blood vessel occlusion with resultant organ, and/or nervous system infarction; and/or aseptic necrosis of one or more joints. Finally, the patient was informed that Medicine is not an exact science; therefore, there  is also the possibility of unforeseen or unpredictable risks and/or possible complications that may result in a catastrophic outcome. The patient indicated having understood very clearly. We have given the patient no guarantees and we have made no promises. Enough time was given to the patient to ask questions, all of which were answered to the patient's satisfaction. Mr. Cowans has indicated that he wanted to continue with the procedure. Attestation: I, the ordering provider, attest that I have discussed with the patient the benefits, risks, side-effects, alternatives, likelihood of achieving goals, and potential problems during recovery for the procedure that I have provided informed consent. Date  Time: {CHL [facility]-PAIN TIME CHOICES:[id number]}  Pre-Procedure Preparation:  Monitoring: As per clinic protocol. Respiration, ETCO2, SpO2, BP, heart rate and rhythm monitor placed and checked for adequate function Safety Precautions: Patient was assessed for positional comfort and pressure points before starting the procedure. Time-out: I initiated and conducted the "Time-out" before starting the procedure, as per protocol. The patient was asked to participate by confirming the accuracy of the "Time Out" information. Verification of the correct person, site, and procedure were performed and confirmed by me, the nursing staff, and the patient. "Time-out" conducted as per Joint Commission's Universal Protocol (UP.01.01.01). Time:    Description of Procedure:       Laterality: Right Level: C3, C4, C5, C6, & C7 Medial Branch Level(s). Area Prepped: Entire Posterior Cervico-thoracic Region Prepping solution: ChloraPrep (2% chlorhexidine gluconate and 70% isopropyl alcohol) Safety Precautions: Aspiration looking for blood return was conducted prior to all injections. At no point did we inject any substances, as a needle was being advanced. Before injecting, the patient was told to immediately notify me if he was  experiencing any new onset of "ringing in the ears, or metallic taste in the mouth". No attempts were made at seeking any paresthesias. Safe injection practices and needle disposal techniques used. Medications properly checked for expiration dates. SDV (single dose vial) medications used. After the completion  of the procedure, all disposable equipment used was discarded in the proper designated medical waste containers. Local Anesthesia: Protocol guidelines were followed. The patient was positioned over the fluoroscopy table. The area was prepped in the usual manner. The time-out was completed. The target area was identified using fluoroscopy. A 12-in long, straight, sterile hemostat was used with fluoroscopic guidance to locate the targets for each level blocked. Once located, the skin was marked with an approved surgical skin marker. Once all sites were marked, the skin (epidermis, dermis, and hypodermis), as well as deeper tissues (fat, connective tissue and muscle) were infiltrated with a small amount of a short-acting local anesthetic, loaded on a 10cc syringe with a 25G, 1.5-in  Needle. An appropriate amount of time was allowed for local anesthetics to take effect before proceeding to the next step. Local Anesthetic: Lidocaine 2.0% The unused portion of the local anesthetic was discarded in the proper designated containers. Technical explanation of process:  Radiofrequency Ablation (RFA) C3 Medial Branch Nerve RFA: The target area for the C3 dorsal medial articular branch is the lateral concave waist of the articular pillar of C3. Under fluoroscopic guidance, a Radiofrequency needle was inserted until contact was made with os over the postero-lateral aspect of the articular pillar of C3 (target area). Sensory and motor testing was conducted to properly adjust the position of the needle. Once satisfactory placement of the needle was achieved, the numbing solution was slowly injected after negative  aspiration for blood. 2.0 mL of the nerve block solution was injected without difficulty or complication. After waiting for at least 3 minutes, the ablation was performed. Once completed, the needle was removed intact. C4 Medial Branch Nerve RFA: The target area for the C4 dorsal medial articular branch is the lateral concave waist of the articular pillar of C4. Under fluoroscopic guidance, a Radiofrequency needle was inserted until contact was made with os over the postero-lateral aspect of the articular pillar of C4 (target area). Sensory and motor testing was conducted to properly adjust the position of the needle. Once satisfactory placement of the needle was achieved, the numbing solution was slowly injected after negative aspiration for blood. 2.0 mL of the nerve block solution was injected without difficulty or complication. After waiting for at least 3 minutes, the ablation was performed. Once completed, the needle was removed intact. C5 Medial Branch Nerve RFA: The target area for the C5 dorsal medial articular branch is the lateral concave waist of the articular pillar of C5. Under fluoroscopic guidance, a Radiofrequency needle was inserted until contact was made with os over the postero-lateral aspect of the articular pillar of C5 (target area). Sensory and motor testing was conducted to properly adjust the position of the needle. Once satisfactory placement of the needle was achieved, the numbing solution was slowly injected after negative aspiration for blood. 2.0 mL of the nerve block solution was injected without difficulty or complication. After waiting for at least 3 minutes, the ablation was performed. Once completed, the needle was removed intact. C6 Medial Branch Nerve RFA: The target area for the C6 dorsal medial articular branch is the lateral concave waist of the articular pillar of C6. Under fluoroscopic guidance, a Radiofrequency needle was inserted until contact was made with os over the  postero-lateral aspect of the articular pillar of C6 (target area). Sensory and motor testing was conducted to properly adjust the position of the needle. Once satisfactory placement of the needle was achieved, the numbing solution was slowly injected after negative  aspiration for blood. 2.0 mL of the nerve block solution was injected without difficulty or complication. After waiting for at least 3 minutes, the ablation was performed. Once completed, the needle was removed intact. C7 Medial Branch Nerve RFA: The target for the C7 dorsal medial articular branch lies on the superior-medial tip of the C7 transverse process. Under fluoroscopic guidance, a Radiofrequency needle was inserted until contact was made with os over the postero-lateral aspect of the articular pillar of C7 (target area). Sensory and motor testing was conducted to properly adjust the position of the needle. Once satisfactory placement of the needle was achieved, the numbing solution was slowly injected after negative aspiration for blood. 2.0 mL of the nerve block solution was injected without difficulty or complication. After waiting for at least 3 minutes, the ablation was performed. Once completed, the needle was removed intact. Radiofrequency lesioning (ablation):  Radiofrequency Generator: NeuroTherm [id number] Sensory Stimulation Parameters: 50 Hz was used to locate & identify the nerve, making sure that the needle was positioned such that there was no sensory stimulation below 0.3 V or above 0.7 V. Motor Stimulation Parameters: 2 Hz was used to evaluate the motor component. Care was taken not to lesion any nerves that demonstrated motor stimulation of the lower extremities at an output of less than 2.5 times that of the sensory threshold, or a maximum of 2.0 V. Lesioning Technique Parameters: Standard Radiofrequency settings. (Not bipolar or pulsed.) Temperature Settings: 80 degrees C Lesioning time: 60 seconds Intra-operative  Compliance: Compliant Materials & Medications: Needle(s) (Electrode/Cannula) Type: Teflon-coated, curved tip, Radiofrequency needle(s) Gauge: 22G Length: 10cm Numbing solution: 0.2% PF-Ropivacaine + Triamcinolone (40 mg/mL) diluted to a final concentration of 4 mg of Triamcinolone/mL of Ropivacaine The unused portion of the solution was discarded in the proper designated containers.  Once the entire procedure was completed, the treated area was cleaned, making sure to leave some of the prepping solution back to take advantage of its long term bactericidal properties.  Intra-operative Compliance: Compliant  There were no vitals filed for this visit.  Start Time:   hrs. End Time:   hrs.  Imaging Guidance (Spinal):  Type of Imaging Technique: Fluoroscopy Guidance (Spinal) Indication(s): Assistance in needle guidance and placement for procedures requiring needle placement in or near specific anatomical locations not easily accessible without such assistance. Exposure Time: Please see nurses notes. Contrast: None used. Fluoroscopic Guidance: I was personally present during the use of fluoroscopy. "Tunnel Vision Technique" used to obtain the best possible view of the target area. Parallax error corrected before commencing the procedure. "Direction-depth-direction" technique used to introduce the needle under continuous pulsed fluoroscopy. Once target was reached, antero-posterior, oblique, and lateral fluoroscopic projection used confirm needle placement in all planes. Images permanently stored in EMR. Interpretation: No contrast injected. I personally interpreted the imaging intraoperatively. Adequate needle placement confirmed in multiple planes. Permanent images saved into the patient's record.  Antibiotic Prophylaxis:   Anti-infectives (From admission, onward)   None     Indication(s): None identified  Post-operative Assessment:  Post-procedure Vital Signs:  Pulse/HCG Rate:    Temp:     Resp:   BP:   SpO2:    EBL: None  Complications: No immediate post-treatment complications observed by team, or reported by patient.  Note: The patient tolerated the entire procedure well. A repeat set of vitals were taken after the procedure and the patient was kept under observation following institutional policy, for this type of procedure. Post-procedural neurological assessment was performed, showing return  to baseline, prior to discharge. The patient was provided with post-procedure discharge instructions, including a section on how to identify potential problems. Should any problems arise concerning this procedure, the patient was given instructions to immediately contact us, at any time, without hesitation. In any case, we plan to contact the patient by telephone for a follow-up status report regarding this interventional procedure.  Comments:  No additional relevant information.  Plan of Care   Imaging Orders  No imaging studies ordered today   Procedure Orders    No procedure(s) ordered today    Medications ordered for procedure: No orders of the defined types were placed in this encounter.  Medications administered: Lajean Manes had no medications administered during this visit.  See the medical record for exact dosing, route, and time of administration.  New Prescriptions   No medications on file   Disposition: Discharge home  Discharge Date & Time: 02/01/2018;   hrs.   Physician-requested Follow-up: No follow-ups on file.  Future Appointments  Date Time Provider [place]  02/01/2018 10:30 AM Milinda Pointer, MD [facility] None  02/22/2018 10:45 AM [facility] None  02/27/2018 10:15 AM Hollice Espy, MD [facility] None  03/05/2018 12:45 PM Vevelyn Francois, NP [facility] None   Primary Care Physician: Tracie Harrier, MD Location: [facility] Outpatient Pain Management Facility Note by: Gaspar Cola, MD Date: 02/01/2018; Time: 7:52 AM  Disclaimer:   Medicine is not an Chief Strategy Officer. The only guarantee in medicine is that nothing is guaranteed. It is important to note that the decision to proceed with this intervention was based on the information collected from the patient. The Data and conclusions were drawn from the patient's questionnaire, the interview, and the physical examination. Because the information was provided in large part by the patient, it cannot be guaranteed that it has not been purposely or unconsciously manipulated. Every effort has been made to obtain as much relevant data as possible for this evaluation. It is important to note that the conclusions that lead to this procedure are derived in large part from the available data. Always take into account that the treatment will also be dependent on availability of resources and existing treatment guidelines, considered by other Pain Management Practitioners as being common knowledge and practice, at the time of the intervention. For Medico-Legal purposes, it is also important to point out that variation in procedural techniques and pharmacological choices are the acceptable norm. The indications, contraindications, technique, and results of the above procedure should only be interpreted and judged by a Board-Certified Interventional Pain Specialist with extensive familiarity and expertise in the same exact procedure and technique.

## 2018-02-22 ENCOUNTER — Other Ambulatory Visit: Payer: Self-pay | Admitting: Family Medicine

## 2018-02-22 ENCOUNTER — Other Ambulatory Visit: Payer: Medicare Other

## 2018-02-22 DIAGNOSIS — C61 Malignant neoplasm of prostate: Secondary | ICD-10-CM

## 2018-02-23 LAB — PSA: Prostate Specific Ag, Serum: 10.9 ng/mL — ABNORMAL HIGH (ref 0.0–4.0)

## 2018-02-27 ENCOUNTER — Encounter: Payer: Self-pay | Admitting: Urology

## 2018-02-27 ENCOUNTER — Ambulatory Visit (INDEPENDENT_AMBULATORY_CARE_PROVIDER_SITE_OTHER): Payer: Medicare Other | Admitting: Urology

## 2018-02-27 VITALS — BP 140/77 | HR 106 | Resp 16 | Ht 68.0 in | Wt 134.6 lb

## 2018-02-27 DIAGNOSIS — N529 Male erectile dysfunction, unspecified: Secondary | ICD-10-CM

## 2018-02-27 DIAGNOSIS — R350 Frequency of micturition: Secondary | ICD-10-CM

## 2018-02-27 DIAGNOSIS — C61 Malignant neoplasm of prostate: Secondary | ICD-10-CM | POA: Diagnosis not present

## 2018-02-27 MED ORDER — TAMSULOSIN HCL 0.4 MG PO CAPS: 0.4000 mg | ORAL_CAPSULE | Freq: Every day | ORAL | 3 refills | Status: AC

## 2018-02-27 NOTE — Progress Notes (Signed)
02/27/2018 10:10 AM   Mark Hodges [birth year] [id number]  Referring provider: Tracie Harrier, MD [address] [facility] [place], [place] [address]  Chief Complaint  Patient presents with  . Prostate Cancer    HPI: 62 year old male with a history of prostate cancer he returns today for routine follow-up.  [place].   Prostate cancer   62 yo M with diagnosed in 01/2014 with T1c Gleason 3+3 prostate cancer in 2/12 cores, 1-3% and single core HGPIN dx 01/2014. PSA at time of dx 5.41. TRUS vol 34 cc. He has been on active surveillance.    Repeat biopsy on 02/20/15 per active surveillance protocol which showed only a small focus of atypical glands, suspicious for carcinoma   TRUS vol 33 cc.     His PSA has been relatively stable in the 5-8 range.    His most recent PSA has risen to 10.9 as of 02/22/2018.  This is up significantly from his PSA 6 months ago at 7.8.  He returns today for PSA.  LUTS History of LUTS previously on Flomax.    More recently, he had been taking saw palmetto with good effect.  He reports that this stopped working.  He now has hesitancy and difficulty starting a stream with a relatively weak stream.  He would like to resume the Flomax.  No dysuria or gross hematuria.  ED Baseline ED, failed multiple PDE5i. No sucess with VED.  Currently good success with caverject 10 mcg.  Working very well.  Using about 1/ month.  No penile scarring of pain.  No curvature.  PHx chronic back pain managed by pain clinic.     PMH: Past Medical History:  Diagnosis Date  . Arthritis   . BPH (benign prostatic hyperplasia)   . Clinical depression 10/23/2014  . Depression   . Erectile dysfunction   . Hyperlipidemia   . Hypogonadism in male   . Lower urinary tract infection   . Premature ejaculation   . Prostate cancer ([place])   . Stroke ([facility])   . TIA (transient  ischemic attack)     Surgical History: Past Surgical History:  Procedure Laterality Date  . COLONOSCOPY WITH PROPOFOL N/A 01/26/2016   Procedure: COLONOSCOPY WITH PROPOFOL;  Surgeon: Lollie Sails, MD;  Location: [facility] ENDOSCOPY;  Service: Endoscopy;  Laterality: N/A;  . ESOPHAGOGASTRODUODENOSCOPY (EGD) WITH PROPOFOL N/A 01/26/2016   Procedure: ESOPHAGOGASTRODUODENOSCOPY (EGD) WITH PROPOFOL;  Surgeon: Lollie Sails, MD;  Location: [facility] ENDOSCOPY;  Service: Endoscopy;  Laterality: N/A;  . TONSILLECTOMY      Home Medications:  Allergies as of 02/27/2018   No Known Allergies     Medication List        Accurate as of 02/27/18 10:10 AM. Always use your most recent med list.          alprostadil 10 MCG injection Commonly known as:  CAVERJECT IMPULSE 10 mcg by Intracavitary route as needed for erectile dysfunction. use no more than 3 times per week   aspirin 325 MG tablet Take 325 mg by mouth daily.   atorvastatin 20 MG tablet Commonly known as:  LIPITOR Take 20 mg by mouth daily at 6 PM.   cyanocobalamin 1000 MCG/ML injection Commonly known as:  (VITAMIN B-12) Inject 1,000 mcg into the muscle every 30 (thirty) days.   diazepam 5 MG tablet Commonly known as:  VALIUM TAKE 1 TABLET  BY MOUTH TWICE A DAY AS NEEDED FOR ANXIETY   DULoxetine 30 MG capsule Commonly known as:  CYMBALTA 30 mg daily.   gabapentin 300 MG capsule Commonly known as:  NEURONTIN Take 300 mg by mouth 3 (three) times daily.   lamoTRIgine 150 MG tablet Commonly known as:  LAMICTAL 150 mg 2 (two) times daily.   nortriptyline 10 MG capsule Commonly known as:  PAMELOR Take 1 capsule at bedtime in addition to one 50 mg capsule to equal 60 mg at night   Oxycodone HCl 10 MG Tabs Take 1 tablet (10 mg total) by mouth every 8 (eight) hours as needed.   Oxycodone HCl 10 MG Tabs Take 1 tablet (10 mg total) by mouth every 8 (eight) hours as needed.   Oxycodone HCl 10 MG Tabs Take 1 tablet (10 mg total)  by mouth every 8 (eight) hours as needed.   sertraline 50 MG tablet Commonly known as:  ZOLOFT Take 50 mg by mouth daily.   tamsulosin 0.4 MG Caps capsule Commonly known as:  FLOMAX Take 1 capsule (0.4 mg total) by mouth daily.   Venlafaxine HCl 225 MG Tb24 Take 2 tablets by mouth daily.       Allergies: No Known Allergies  Family History: Family History  Problem Relation Age of Onset  . Heart disease Father   . Prostate cancer Neg Hx   . Bladder Cancer Neg Hx     Social History:  reports that he has been smoking.  His smokeless tobacco use includes chew. He reports that he does not drink alcohol or use drugs.  ROS: UROLOGY Frequent Urination?: Yes Hard to postpone urination?: No Burning/pain with urination?: No Get up at night to urinate?: No Leakage of urine?: No Urine stream starts and stops?: Yes Trouble starting stream?: Yes Do you have to strain to urinate?: Yes Blood in urine?: No Urinary tract infection?: No Sexually transmitted disease?: No Injury to kidneys or bladder?: No Painful intercourse?: No Weak stream?: No Erection problems?: Yes Penile pain?: No  Gastrointestinal Nausea?: No Vomiting?: No Indigestion/heartburn?: No Diarrhea?: No Constipation?: No  Constitutional Fever: No Night sweats?: No Weight loss?: No Fatigue?: Yes  Skin Skin rash/lesions?: No Itching?: No  Eyes Blurred vision?: No Double vision?: No  Ears/Nose/Throat Sore throat?: No Sinus problems?: No  Hematologic/Lymphatic Swollen glands?: No Easy bruising?: Yes  Cardiovascular Leg swelling?: No Chest pain?: No  Respiratory Cough?: No Shortness of breath?: No  Endocrine Excessive thirst?: No  Musculoskeletal Back pain?: Yes Joint pain?: No  Neurological Headaches?: No Dizziness?: No  Psychologic Depression?: Yes Anxiety?: Yes  Physical Exam: BP 140/77   Pulse (!) 106   Resp 16   Ht 5\' 8"  (1.727 m)   Wt 134 lb 9.6 oz (61.1 kg)   SpO2  96%   BMI 20.47 kg/m   Constitutional:  Alert and oriented, No acute distress. HEENT: Green AT, moist mucus membranes.  Trachea midline, no masses. Cardiovascular: No clubbing, cyanosis, or edema. Respiratory: Normal respiratory effort, no increased work of breathing. GI: Abdomen is soft, nontender, nondistended, no abdominal masses Rectal: Normal sphincter tone.  Enlarged 50 cc prostate, nontender, no nodules. Skin: No rashes, bruises or suspicious lesions. Neurologic: Grossly intact, no focal deficits, moving all 4 extremities. Psychiatric: Normal mood and affect.  Laboratory Data: PSA as above  Urinalysis N/a  Pertinent Imaging: N/a  Assessment & Plan:    1. Prostate cancer ([place]) Low risk prostate cancer and active surveillance PSA slightly up, history of  fluctuation We will recheck in 3 months as well as 6 months If PSA continues to rise, consider prostate MRI Rectal exam today reassuring - PSA; Future  2. Urinary hesitancy Recurrent obstructive urinary symptoms Would like to resume Flomax Prescription provided today  3. Erectile dysfunction, unspecified erectile dysfunction type Continue Caverject No issues with his medication, working well   Return in about 6 months (around 08/29/2018), or MD with PSA (PSA  only lab visit in 3 mo).  Hollice Espy, MD  [facility] [address] [place], [place] [address] [phone]

## 2018-03-01 ENCOUNTER — Telehealth: Payer: Self-pay | Admitting: Pain Medicine

## 2018-03-01 NOTE — Telephone Encounter (Signed)
Patient states he will no longer be seeing our pain clinic for any neck or headaches, only back issue.

## 2018-03-05 ENCOUNTER — Encounter: Payer: Medicare Other | Admitting: Nurse Practitioner

## 2018-04-02 ENCOUNTER — Encounter: Payer: Self-pay | Admitting: Nurse Practitioner

## 2018-04-02 ENCOUNTER — Other Ambulatory Visit: Payer: Self-pay

## 2018-04-02 ENCOUNTER — Ambulatory Visit: Payer: Medicare Other | Attending: Nurse Practitioner | Admitting: Nurse Practitioner

## 2018-04-02 VITALS — BP 128/99 | HR 97 | Temp 98.4°F | Resp 16 | Ht 68.0 in | Wt 135.0 lb

## 2018-04-02 DIAGNOSIS — Z79891 Long term (current) use of opiate analgesic: Secondary | ICD-10-CM | POA: Diagnosis not present

## 2018-04-02 DIAGNOSIS — M47816 Spondylosis without myelopathy or radiculopathy, lumbar region: Secondary | ICD-10-CM

## 2018-04-02 DIAGNOSIS — M542 Cervicalgia: Secondary | ICD-10-CM | POA: Diagnosis not present

## 2018-04-02 DIAGNOSIS — F411 Generalized anxiety disorder: Secondary | ICD-10-CM | POA: Insufficient documentation

## 2018-04-02 DIAGNOSIS — M1611 Unilateral primary osteoarthritis, right hip: Secondary | ICD-10-CM | POA: Insufficient documentation

## 2018-04-02 DIAGNOSIS — E7800 Pure hypercholesterolemia, unspecified: Secondary | ICD-10-CM | POA: Diagnosis not present

## 2018-04-02 DIAGNOSIS — M4722 Other spondylosis with radiculopathy, cervical region: Secondary | ICD-10-CM | POA: Insufficient documentation

## 2018-04-02 DIAGNOSIS — R270 Ataxia, unspecified: Secondary | ICD-10-CM | POA: Diagnosis not present

## 2018-04-02 DIAGNOSIS — G894 Chronic pain syndrome: Secondary | ICD-10-CM | POA: Diagnosis not present

## 2018-04-02 DIAGNOSIS — E785 Hyperlipidemia, unspecified: Secondary | ICD-10-CM | POA: Diagnosis not present

## 2018-04-02 DIAGNOSIS — G8929 Other chronic pain: Secondary | ICD-10-CM | POA: Diagnosis not present

## 2018-04-02 DIAGNOSIS — N529 Male erectile dysfunction, unspecified: Secondary | ICD-10-CM | POA: Insufficient documentation

## 2018-04-02 DIAGNOSIS — Z8673 Personal history of transient ischemic attack (TIA), and cerebral infarction without residual deficits: Secondary | ICD-10-CM | POA: Diagnosis not present

## 2018-04-02 DIAGNOSIS — Z720 Tobacco use: Secondary | ICD-10-CM | POA: Insufficient documentation

## 2018-04-02 DIAGNOSIS — N400 Enlarged prostate without lower urinary tract symptoms: Secondary | ICD-10-CM | POA: Insufficient documentation

## 2018-04-02 DIAGNOSIS — J449 Chronic obstructive pulmonary disease, unspecified: Secondary | ICD-10-CM | POA: Insufficient documentation

## 2018-04-02 MED ORDER — OXYCODONE HCL 10 MG PO TABS: 10.0000 mg | ORAL_TABLET | Freq: Three times a day (TID) | ORAL | 0 refills | Status: DC | PRN

## 2018-04-02 NOTE — Progress Notes (Signed)
Patient's Name: Mark Hodges  MRN: [id number]  Referring Provider: Tracie Harrier, MD  DOB: 1955/10/31  PCP: Tracie Harrier, MD  DOS: 04/02/2018  Note by: Vevelyn Francois NP  Service setting: Ambulatory outpatient  Specialty: Interventional Pain Management  Location: [facility] (AMB) Pain Management Facility    Patient type: Established    Primary Reason(s) for Visit: Encounter for prescription drug management. (Level of risk: moderate)  CC: No chief complaint on file.  HPI  Mark Hodges is a 62 y.o. year old, male patient, who comes today for a medication management evaluation. He has Difficulty in walking; Compulsive tobacco user syndrome; Hypercholesterolemia without hypertriglyceridemia; Long term current use of opiate analgesic; Long term prescription opiate use; Opiate use (15 MME/Day); Opiate dependence ([place]); Encounter for therapeutic drug level monitoring; Chronic low back pain (Primary Source of Pain) (Bilateral) (R>L); Chronic lumbar radicular pain (Polyradiculopathy) (Right); Ataxia; Hyperlipidemia; Current tobacco use; Pure hypercholesterolemia; Arm numbness; Substance use disorder Risk: LOW; Chronic back pain; Chronic neck pain (Tertiary Area of Pain) (Bilateral) (R>L); Cervicogenic headache; Chronic radicular cervical pain; Chronic hip pain (Secondary source of pain) (Right); Generalized anxiety disorder; At high risk for falls; Erectile dysfunction; History of TIA (transient ischemic attack); 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 without associated mass lesion or cord expansion.; Abnormal MRI, lumbar spine (06/26/2015); Lumbar spondylosis; 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, by EMG/PNCV; Neuropathic pain; Chronic lower extremity pain (Right); History of prostate cancer; Lumbar facet arthropathy; 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); Mild chronic obstructive pulmonary disease ([place]); Osteoarthritis of hip (Right); Abnormal MRI, thoracic spine (07/23/2015);  Osteoarthritis of hip (Right); Lumbar facet syndrome (Bilateral) (R>L); Clinical depression; Acute postoperative pain; Chronic pain syndrome; T12-L1 disc extrusion; Cervico-occipital neuralgia (Bilateral); Whiplash injury syndrome, initial encounter; Disturbance of skin sensation; Cervical facet syndrome (HCC) (Bilateral) (R>L); Mood disorder ([place]); Cervical spondylosis; Spondylosis without myelopathy or radiculopathy, cervical region; Cervicalgia; and Depression on their problem list. His primarily concern today is the No chief complaint on file.  Pain Assessment: Location: Lower, Mid Back Radiating: denies today Onset: More than a month ago Duration: Chronic pain Quality: Constant, Dull, Nagging Severity: 5 /10 (subjective, self-reported pain score)  Note: Reported level is compatible with observation.                          Timing: Constant Modifying factors: medications, heat, rest BP: (!) 128/99  HR: 97  Mark Hodges was last scheduled for an appointment on 12/04/2017 for medication management. During today's appointment we reviewed Mark Hodges chronic pain status, as well as his outpatient medication regimen.   The patient  reports that he does not use drugs. His body mass index is 20.53 kg/m.  Further details on both, my assessment(s), as well as the proposed treatment plan, please see below.  Controlled Substance Pharmacotherapy Assessment REMS (Risk Evaluation and Mitigation Strategy)  Analgesic: Oxycodone IR 10 mg 1 tablet p.o. every 8 hours (30 mg/day of oxycodone) (45 MME/day) MME/day: 45 mg/day.   Hart Rochester, RN  04/02/2018 10:21 AM  Sign at close encounter Nursing Pain Medication Assessment:  Safety precautions to be maintained throughout the outpatient stay will include: orient to surroundings, keep bed in low position, maintain call bell within reach at all times, provide assistance with transfer out of bed and ambulation.  Medication Inspection Compliance:  Pill count conducted under aseptic conditions, in front of the patient. Neither the pills nor the bottle was removed from  the patient's sight at any time. Once count was completed pills were immediately returned to the patient in their original bottle.  Medication: Oxycodone IR Pill/Patch Count: 45 of 90 pills remain Pill/Patch Appearance: Markings consistent with prescribed medication Bottle Appearance: Standard pharmacy container. Clearly labeled. Filled Date: 06 / 23 / 2019 Last Medication intake:  Yesterday   Pharmacokinetics: Liberation and absorption (onset of action): WNL Distribution (time to peak effect): WNL Metabolism and excretion (duration of action): WNL         Pharmacodynamics: Desired effects: Analgesia: Mark Hodges reports >50% benefit. Functional ability: Patient reports that medication allows him to accomplish basic ADLs Clinically meaningful improvement in function (CMIF): Sustained CMIF goals met Perceived effectiveness: Described as relatively effective, allowing for increase in activities of daily living (ADL) Undesirable effects: Side-effects or Adverse reactions: None reported Monitoring: [place] PMP: Online review of the past 32-monthperiod conducted. Compliant with practice rules and regulations Last UDS on record: Summary  Date Value Ref Range Status  09/07/2017 FINAL  Final    Comment:    ==================================================================== TOXASSURE SELECT 13 (MW) ==================================================================== Test                             Result       Flag       Units Drug Present and Declared for Prescription Verification   Desmethyldiazepam              223          EXPECTED   ng/mg creat   Oxazepam                       750          EXPECTED   ng/mg creat   Temazepam                      350          EXPECTED   ng/mg creat    Desmethyldiazepam, oxazepam, and temazepam are expected    metabolites of diazepam.  Desmethyldiazepam and oxazepam are also    expected metabolites of other drugs, including chlordiazepoxide,    prazepam, clorazepate, and halazepam. Oxazepam is an expected    metabolite of temazepam. Oxazepam and temazepam are also    available as scheduled prescription medications.   Oxycodone                      367          EXPECTED   ng/mg creat   Oxymorphone                    540          EXPECTED   ng/mg creat   Noroxycodone                   870          EXPECTED   ng/mg creat   Noroxymorphone                 363          EXPECTED   ng/mg creat    Sources of oxycodone are scheduled prescription medications.    Oxymorphone, noroxycodone, and noroxymorphone are expected    metabolites of oxycodone. Oxymorphone is also available as a    scheduled prescription medication. ==================================================================== Test  Result    Flag   Units      Ref Range   Creatinine              30               mg/dL      >=20 ==================================================================== Declared Medications:  The flagging and interpretation on this report are based on the  following declared medications.  Unexpected results may arise from  inaccuracies in the declared medications.  **Note: The testing scope of this panel includes these medications:  Diazepam  Oxycodone  **Note: The testing scope of this panel does not include following  reported medications:  Alprostadil  Aspirin  Atorvastatin  Cyanocobalamin  Duloxetine  Gabapentin  Lamotrigine  Nortriptyline  Sertraline  Venlafaxine ==================================================================== For clinical consultation, please call [phone]. ====================================================================    UDS interpretation: Compliant          Medication Assessment Form: Reviewed. Patient indicates being compliant with therapy Treatment compliance:  Compliant Risk Assessment Profile: Aberrant behavior: See prior evaluations. None observed or detected today Comorbid factors increasing risk of overdose: See prior notes. No additional risks detected today Risk of substance use disorder (SUD): Low Opioid Risk Tool - 04/02/18 1025      Family History of Substance Abuse   Alcohol  Positive Male    Illegal Drugs  Negative    Rx Drugs  Negative      Personal History of Substance Abuse   Alcohol  Negative    Illegal Drugs  Negative    Rx Drugs  Negative      Age   Age between 82-45 years   No      History of Preadolescent Sexual Abuse   History of Preadolescent Sexual Abuse  Negative or Male      Psychological Disease   Psychological Disease  Negative    Depression  Positive      Total Score   Opioid Risk Tool Scoring  4    Opioid Risk Interpretation  Moderate Risk      ORT Scoring interpretation table:  Score <3 = Low Risk for SUD  Score between 4-7 = Moderate Risk for SUD  Score >8 = High Risk for Opioid Abuse   Risk Mitigation Strategies:  Patient Counseling: Covered Patient-Prescriber Agreement (PPA): Present and active  Notification to other healthcare providers: Done  Pharmacologic Plan: No change in therapy, at this time.             Laboratory Chemistry  Inflammation Markers (CRP: Acute Phase) (ESR: Chronic Phase) Lab Results  Component Value Date   CRP 1.5 04/19/2017   ESRSEDRATE 2 04/19/2017                         Rheumatology Markers No results found for: RF, ANA, LABURIC, URICUR, LYMEIGGIGMAB, LYMEABIGMQN, HLAB27                      Renal Function Markers Lab Results  Component Value Date   BUN 9 04/19/2017   CREATININE 0.82 04/19/2017   BCR 11 04/19/2017   GFRAA 113 04/19/2017   GFRNONAA 97 04/19/2017                             Hepatic Function Markers Lab Results  Component Value Date   AST 20 04/19/2017   ALT 18 04/19/2017   ALBUMIN 5.1 04/19/2017  ALKPHOS 109 04/19/2017                         Electrolytes Lab Results  Component Value Date   NA 140 04/19/2017   K 4.7 04/19/2017   CL 101 04/19/2017   CALCIUM 10.5 (H) 04/19/2017   MG 2.3 04/19/2017                        Neuropathy Markers Lab Results  Component Value Date   VITAMINB12 1,147 04/19/2017                        Bone Pathology Markers Lab Results  Component Value Date   25OHVITD1 55 04/19/2017   25OHVITD2 <1.0 04/19/2017   25OHVITD3 54 04/19/2017                         Coagulation Parameters No results found for: INR, LABPROT, APTT, PLT, DDIMER                      Cardiovascular Markers No results found for: BNP, CKTOTAL, CKMB, TROPONINI, HGB, HCT                       CA Markers No results found for: CEA, CA125, LABCA2                      Note: Lab results reviewed.  Recent Diagnostic Imaging Results  DG C-Arm 1-60 Min-No Report Fluoroscopy was utilized by the requesting physician.  No radiographic  interpretation.   Complexity Note: Imaging results reviewed. Results shared with Mark Hodges, using Layman's terms.                         Meds   Current Outpatient Medications:  .  alprostadil (CAVERJECT IMPULSE) 10 MCG injection, 10 mcg by Intracavitary route as needed for erectile dysfunction. use no more than 3 times per week (Patient taking differently: 10 mcg by Intracavitary route as needed for erectile dysfunction. use no more than 3 times per week), Disp: 3 each, Rfl: 3 .  aspirin 325 MG tablet, Take 325 mg by mouth daily. , Disp: , Rfl:  .  atorvastatin (LIPITOR) 20 MG tablet, Take 20 mg by mouth daily at 6 PM. , Disp: , Rfl:  .  cyanocobalamin (,VITAMIN B-12,) 1000 MCG/ML injection, Inject 1,000 mcg into the muscle every 30 (thirty) days. , Disp: , Rfl:  .  diazepam (VALIUM) 5 MG tablet, TAKE 1 TABLET BY MOUTH TWICE A DAY AS NEEDED FOR ANXIETY, Disp: , Rfl: 2 .  DULoxetine (CYMBALTA) 30 MG capsule, 30 mg daily. , Disp: , Rfl:  .  gabapentin (NEURONTIN) 300 MG capsule,  Take 300 mg by mouth 3 (three) times daily. , Disp: , Rfl:  .  lamoTRIgine (LAMICTAL) 150 MG tablet, 150 mg 2 (two) times daily. , Disp: , Rfl:  .  nortriptyline (PAMELOR) 10 MG capsule, Take 1 capsule at bedtime in addition to one 50 mg capsule to equal 60 mg at night, Disp: , Rfl:  .  [START ON 06/15/2018] Oxycodone HCl 10 MG TABS, Take 1 tablet (10 mg total) by mouth every 8 (eight) hours as needed., Disp: 90 tablet, Rfl: 0 .  sertraline (ZOLOFT) 50 MG tablet, Take 50 mg by mouth daily., Disp: ,  Rfl:  .  tamsulosin (FLOMAX) 0.4 MG CAPS capsule, Take 1 capsule (0.4 mg total) by mouth daily., Disp: 90 capsule, Rfl: 3 .  Venlafaxine HCl 225 MG TB24, Take 2 tablets by mouth daily. , Disp: , Rfl:  .  [START ON 05/16/2018] Oxycodone HCl 10 MG TABS, Take 1 tablet (10 mg total) by mouth every 8 (eight) hours as needed., Disp: 90 tablet, Rfl: 0 .  [START ON 04/16/2018] Oxycodone HCl 10 MG TABS, Take 1 tablet (10 mg total) by mouth every 8 (eight) hours as needed., Disp: 90 tablet, Rfl: 0  ROS  Constitutional: Denies any fever or chills Gastrointestinal: No reported hemesis, hematochezia, vomiting, or acute GI distress Musculoskeletal: Denies any acute onset joint swelling, redness, loss of ROM, or weakness Neurological: No reported episodes of acute onset apraxia, aphasia, dysarthria, agnosia, amnesia, paralysis, loss of coordination, or loss of consciousness  Allergies  Mark Hodges has No Known Allergies.  PFSH  Drug: Mark Hodges  reports that he does not use drugs. Alcohol:  reports that he does not drink alcohol. Tobacco:  reports that he has been smoking.  His smokeless tobacco use includes chew. Medical:  has a past medical history of Arthritis, BPH (benign prostatic hyperplasia), Clinical depression (10/23/2014), Depression, Erectile dysfunction, Hyperlipidemia, Hypogonadism in male, Lower urinary tract infection, Premature ejaculation, Prostate cancer ([place]), Stroke ([place]), and TIA (transient  ischemic attack). Surgical: Mark Hodges  has a past surgical history that includes Tonsillectomy; Colonoscopy with propofol (N/A, 01/26/2016); and Esophagogastroduodenoscopy (egd) with propofol (N/A, 01/26/2016). Family: family history includes Heart disease in his father.  Constitutional Exam  General appearance: Well nourished, well developed, and well hydrated. In no apparent acute distress Vitals:   04/02/18 1021  BP: (!) 128/99  Pulse: 97  Resp: 16  Temp: 98.4 F (36.9 C)  TempSrc: Oral  SpO2: 100%  Weight: 135 lb (61.2 kg)  Height: 5' 8"  (1.727 m)  Psych/Mental status: Alert, oriented x 3 (person, place, & time)       Eyes: PERLA Respiratory: No evidence of acute respiratory distress   Lumbar Spine Area Exam  Skin & Axial Inspection: No masses, redness, or swelling Alignment: Symmetrical Functional ROM: Unrestricted ROM       Stability: No instability detected Muscle Tone/Strength: Functionally intact. No obvious neuro-muscular anomalies detected. Sensory (Neurological): Unimpaired Palpation: No palpable anomalies       Provocative Tests: Lumbar Hyperextension/rotation test: deferred today       Lumbar quadrant test (Kemp's test): deferred today       Lumbar Lateral bending test: deferred today       Patrick's Maneuver: deferred today                   FABER test: deferred today       Thigh-thrust test: deferred today       S-I compression test: deferred today       S-I distraction test: deferred today        Gait & Posture Assessment  Ambulation: Patient ambulates using a cane Gait: Awkward Posture: WNL   Lower Extremity Exam    Side: Right lower extremity  Side: Left lower extremity  Stability: No instability observed          Stability: No instability observed          Skin & Extremity Inspection: Skin color, temperature, and hair growth are WNL. No peripheral edema or cyanosis. No masses, redness, swelling, asymmetry, or associated skin lesions. No contractures.  Skin & Extremity Inspection: Skin color, temperature, and hair growth are WNL. No peripheral edema or cyanosis. No masses, redness, swelling, asymmetry, or associated skin lesions. No contractures.  Functional ROM: Unrestricted ROM                  Functional ROM: Unrestricted ROM                  Muscle Tone/Strength: Functionally intact. No obvious neuro-muscular anomalies detected.  Muscle Tone/Strength: Functionally intact. No obvious neuro-muscular anomalies detected.  Sensory (Neurological): Unimpaired  Sensory (Neurological): Unimpaired  Palpation: No palpable anomalies  Palpation: No palpable anomalies   Assessment  Primary Diagnosis & Pertinent Problem List: The primary encounter diagnosis was Lumbar spondylosis. Diagnoses of Lumbar facet arthropathy, Chronic neck pain (Tertiary Area of Pain) (Bilateral) (R>L), Chronic pain syndrome, and Long term prescription opiate use were also pertinent to this visit.  Status Diagnosis  Persistent Persistent Resolved 1. Lumbar spondylosis   2. Lumbar facet arthropathy   3. 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 Chronic pain syndrome   5. Long term prescription opiate use     Problems updated and reviewed during this visit: No problems updated. Plan of Care  Pharmacotherapy (Medications Ordered): Meds ordered this encounter  Medications  . Oxycodone HCl 10 MG TABS    Sig: Take 1 tablet (10 mg total) by mouth every 8 (eight) hours as needed.    Dispense:  90 tablet    Refill:  0    Do not place this medication, or any other prescription from our practice, on "Automatic Refill". Patient may have prescription filled one day early if pharmacy is closed on scheduled refill date. Do not fill until: 06/15/2018 To last until: 07/15/2018    Order Specific Question:   Supervising Provider    Answer:   Milinda Pointer [phone]  . Oxycodone HCl 10 MG TABS    Sig: Take 1 tablet (10 mg total) by mouth every 8 (eight)  hours as needed.    Dispense:  90 tablet    Refill:  0    Do not place this medication, or any other prescription from our practice, on "Automatic Refill". Patient may have prescription filled one day early if pharmacy is closed on scheduled refill date. Do not fill until: 05/16/2018 To last until: 06/15/2018    Order Specific Question:   Supervising Provider    Answer:   Milinda Pointer [phone]  . Oxycodone HCl 10 MG TABS    Sig: Take 1 tablet (10 mg total) by mouth every 8 (eight) hours as needed.    Dispense:  90 tablet    Refill:  0    Do not place this medication, or any other prescription from our practice, on "Automatic Refill". Patient may have prescription filled one day early if pharmacy is closed on scheduled refill date. Do not fill until: 04/16/2018 To last until: 05/16/2018    Order Specific Question:   Supervising Provider    Answer:   Milinda Pointer [phone]   New Prescriptions   No medications on file   Medications administered today: Lajean Manes had no medications administered during this visit. Lab-work, procedure(s), and/or referral(s): Orders Placed This Encounter  Procedures  . Radiofrequency,Lumbar  . ToxASSURE Select 13 (MW), Urine   Imaging and/or referral(s): None   Interventional management options: Planned, scheduled, and/or pending:   Palliative right lumbar facet RFA   Considering:   Diagnostic bilateral greater occipital  nerve block Possible bilateral greater occipital nerve RFA Possible bilateral occipital peripheral nerve stimulator trial Diagnostic right-sided cervical epidural steroid injection Diagnostic bilateral cervical facet block Possible bilateral cervical facet RFA Palliative bilateral lumbar facetblock  Palliative bilateral lumbar facet RFA(right: 08/10/2016; left:12/19/2016) Palliative right intra-articular hipjoint injection  Diagnostic right-sided femoral nerve + obturator nerve block Possible  right-sided femoral nerve + obturator nerve RFA Diagnostic bilateral L3-4 transforaminal epiduralsteroid injection  Diagnostic midline T12-L1 lumbar epiduralsteroid injection    Palliative PRN treatment(s):   Diagnostic bilateral greater occipital nerve block Diagnostic bilateral cervical facet block Palliative bilateral lumbar facetblock  Palliative right intra-articular hipjoint injection  Diagnostic bilateral L3-4 transforaminal epiduralsteroid injection  Diagnostic midline T12-L1 lumbar epiduralsteroid injection       Provider-requested follow-up: Return in about 3 months (around 07/03/2018) for MedMgmt with Me Dionisio David), in addition, Procedure(w/Sedation), w/ Dr. Dossie Arbour, Left Lumbar RFA.  Future Appointments  Date Time Provider [place]  04/17/2018  2:15 PM Milinda Pointer, MD [facility] None  06/06/2018 10:15 AM [facility] None  07/03/2018  8:30 AM Vevelyn Francois, NP [facility] None  08/29/2018 10:15 AM [facility] None  09/04/2018  3:15 PM Hollice Espy, MD [facility] None   Primary Care Physician: Tracie Harrier, MD Location: [facility] Note by: Vevelyn Francois NP Date: 04/02/2018; Time: 1:19 PM  Pain Score Disclaimer: We use the NRS-11 scale. This is a self-reported, subjective measurement of pain severity with only modest accuracy. It is used primarily to identify changes within a particular patient. It must be understood that outpatient pain scales are significantly less accurate that those used for research, where they can be applied under ideal controlled circumstances with minimal exposure to variables. In reality, the score is likely to be a combination of pain intensity and pain affect, where pain affect describes the degree of emotional arousal or changes in action readiness caused by the sensory experience of pain. Factors such as social and work situation, setting, emotional state, anxiety levels,  expectation, and prior pain experience may influence pain perception and show large inter-individual differences that may also be affected by time variables.  Patient instructions provided during this appointment: Patient Instructions   ____________________________________________________________________________________________  Medication Rules  Applies to: All patients receiving prescriptions (written or electronic).  Pharmacy of record: Pharmacy where electronic prescriptions will be sent. If written prescriptions are taken to a different pharmacy, please inform the nursing staff. The pharmacy listed in the electronic medical record should be the one where you would like electronic prescriptions to be sent.  Prescription refills: Only during scheduled appointments. Applies to both, written and electronic prescriptions.  NOTE: The following applies primarily to controlled substances (Opioid* Pain Medications).   Patient's responsibilities: 1. Pain Pills: Bring all pain pills to every appointment (except for procedure appointments). 2. Pill Bottles: Bring pills in original pharmacy bottle. Always bring newest bottle. Bring bottle, even if empty. 3. Medication refills: You are responsible for knowing and keeping track of what medications you need refilled. The day before your appointment, write a list of all prescriptions that need to be refilled. Bring that list to your appointment and give it to the admitting nurse. Prescriptions will be written only during appointments. If you forget a medication, it will not be "Called in", "Faxed", or "electronically sent". You will need to get another appointment to get these prescribed. 4. Prescription Accuracy: You are responsible for carefully inspecting your prescriptions before leaving our office. Have the discharge nurse carefully go  over each prescription with you, before taking them home. Make sure that your name is accurately spelled, that your  address is correct. Check the name and dose of your medication to make sure it is accurate. Check the number of pills, and the written instructions to make sure they are clear and accurate. Make sure that you are given enough medication to last until your next medication refill appointment. 5. Taking Medication: Take medication as prescribed. Never take more pills than instructed. Never take medication more frequently than prescribed. Taking less pills or less frequently is permitted and encouraged, when it comes to controlled substances (written prescriptions).  6. Inform other Doctors: Always inform, all of your healthcare providers, of all the medications you take. 7. Pain Medication from other Providers: You are not allowed to accept any additional pain medication from any other Doctor or Healthcare provider. There are two exceptions to this rule. (see below) In the event that you require additional pain medication, you are responsible for notifying us, as stated below. 8. Medication Agreement: You are responsible for carefully reading and following our Medication Agreement. This must be signed before receiving any prescriptions from our practice. Safely store a copy of your signed Agreement. Violations to the Agreement will result in no further prescriptions. (Additional copies of our Medication Agreement are available upon request.) 9. Laws, Rules, & Regulations: All patients are expected to follow all Federal and [employer], [employer], Rules, [employer]. Ignorance of the Laws does not constitute a valid excuse. The use of any illegal substances is prohibited. 10. Adopted CDC guidelines & recommendations: Target dosing levels will be at or below 60 MME/day. Use of benzodiazepines** is not recommended.  Exceptions: There are only two exceptions to the rule of not receiving pain medications from other Healthcare Providers. 1. Exception #1 (Emergencies): In the event of an emergency (i.e.: accident  requiring emergency care), you are allowed to receive additional pain medication. However, you are responsible for: As soon as you are able, call our office [phone], at any time of the day or night, and leave a message stating your name, the date and nature of the emergency, and the name and dose of the medication prescribed. In the event that your call is answered by a member of our staff, make sure to document and save the date, time, and the name of the person that took your information.  2. Exception #2 (Planned Surgery): In the event that you are scheduled by another doctor or dentist to have any type of surgery or procedure, you are allowed (for a period no longer than 30 days), to receive additional pain medication, for the acute post-op pain. However, in this case, you are responsible for picking up a copy of our "Post-op Pain Management for Surgeons" handout, and giving it to your surgeon or dentist. This document is available at our office, and does not require an appointment to obtain it. Simply go to our office during business hours (Monday-Thursday from 8:00 AM to 4:00 PM) (Friday 8:00 AM to 12:00 Noon) or if you have a scheduled appointment with [place], prior to your surgery, and ask for it by name. In addition, you will need to provide [place] with your name, name of your surgeon, type of surgery, and date of procedure or surgery.  *Opioid medications include: morphine, codeine, oxycodone, oxymorphone, hydrocodone, hydromorphone, meperidine, tramadol, tapentadol, buprenorphine, fentanyl, methadone. **Benzodiazepine medications include: diazepam (Valium), alprazolam (Xanax), clonazepam (Klonopine), lorazepam (Ativan), clorazepate (Tranxene), chlordiazepoxide (Librium), estazolam (Prosom),  oxazepam (Serax), temazepam (Restoril), triazolam (Halcion) (Last updated:  11/23/2017) ____________________________________________________________________________________________   ____________________________________________________________________________________________  Medication Rules  Applies to: All patients receiving prescriptions (written or electronic).  Pharmacy of record: Pharmacy where electronic prescriptions will be sent. If written prescriptions are taken to a different pharmacy, please inform the nursing staff. The pharmacy listed in the electronic medical record should be the one where you would like electronic prescriptions to be sent.  Prescription refills: Only during scheduled appointments. Applies to both, written and electronic prescriptions.  NOTE: The following applies primarily to controlled substances (Opioid* Pain Medications).   Patient's responsibilities: 11. Pain Pills: Bring all pain pills to every appointment (except for procedure appointments). 12. Pill Bottles: Bring pills in original pharmacy bottle. Always bring newest bottle. Bring bottle, even if empty. 13. Medication refills: You are responsible for knowing and keeping track of what medications you need refilled. The day before your appointment, write a list of all prescriptions that need to be refilled. Bring that list to your appointment and give it to the admitting nurse. Prescriptions will be written only during appointments. If you forget a medication, it will not be "Called in", "Faxed", or "electronically sent". You will need to get another appointment to get these prescribed. 14. Prescription Accuracy: You are responsible for carefully inspecting your prescriptions before leaving our office. Have the discharge nurse carefully go over each prescription with you, before taking them home. Make sure that your name is accurately spelled, that your address is correct. Check the name and dose of your medication to make sure it is accurate. Check the number of pills, and the  written instructions to make sure they are clear and accurate. Make sure that you are given enough medication to last until your next medication refill appointment. 15. Taking Medication: Take medication as prescribed. Never take more pills than instructed. Never take medication more frequently than prescribed. Taking less pills or less frequently is permitted and encouraged, when it comes to controlled substances (written prescriptions).  16. Inform other Doctors: Always inform, all of your healthcare providers, of all the medications you take. 17. Pain Medication from other Providers: You are not allowed to accept any additional pain medication from any other Doctor or Healthcare provider. There are two exceptions to this rule. (see below) In the event that you require additional pain medication, you are responsible for notifying us, as stated below. 18. Medication Agreement: You are responsible for carefully reading and following our Medication Agreement. This must be signed before receiving any prescriptions from our practice. Safely store a copy of your signed Agreement. Violations to the Agreement will result in no further prescriptions. (Additional copies of our Medication Agreement are available upon request.) 19. Laws, Rules, & Regulations: All patients are expected to follow all Federal and [employer], [employer], Rules, [employer]. Ignorance of the Laws does not constitute a valid excuse. The use of any illegal substances is prohibited. 20. Adopted CDC guidelines & recommendations: Target dosing levels will be at or below 60 MME/day. Use of benzodiazepines** is not recommended.  Exceptions: There are only two exceptions to the rule of not receiving pain medications from other Healthcare Providers. 3. Exception #1 (Emergencies): In the event of an emergency (i.e.: accident requiring emergency care), you are allowed to receive additional pain medication. However, you are responsible for: As soon  as you are able, call our office [phone], at any time of the day or night, and leave a message stating your name, the date  and nature of the emergency, and the name and dose of the medication prescribed. In the event that your call is answered by a member of our staff, make sure to document and save the date, time, and the name of the person that took your information.  4. Exception #2 (Planned Surgery): In the event that you are scheduled by another doctor or dentist to have any type of surgery or procedure, you are allowed (for a period no longer than 30 days), to receive additional pain medication, for the acute post-op pain. However, in this case, you are responsible for picking up a copy of our "Post-op Pain Management for Surgeons" handout, and giving it to your surgeon or dentist. This document is available at our office, and does not require an appointment to obtain it. Simply go to our office during business hours (Monday-Thursday from 8:00 AM to 4:00 PM) (Friday 8:00 AM to 12:00 Noon) or if you have a scheduled appointment with [place], prior to your surgery, and ask for it by name. In addition, you will need to provide [place] with your name, name of your surgeon, type of surgery, and date of procedure or surgery.  *Opioid medications include: morphine, codeine, oxycodone, oxymorphone, hydrocodone, hydromorphone, meperidine, tramadol, tapentadol, buprenorphine, fentanyl, methadone. **Benzodiazepine medications include: diazepam (Valium), alprazolam (Xanax), clonazepam (Klonopine), lorazepam (Ativan), clorazepate (Tranxene), chlordiazepoxide (Librium), estazolam (Prosom), oxazepam (Serax), temazepam (Restoril), triazolam (Halcion) (Last updated: 11/23/2017) ____________________________________________________________________________________________   GENERAL RISKS AND COMPLICATIONS  What are the risk, side effects and possible complications? Generally speaking, most procedures are safe.  However,  with any procedure there are risks, side effects, and the possibility of complications.  The risks and complications are dependent upon the sites that are lesioned, or the type of nerve block to be performed.  The closer the procedure is to the spine, the more serious the risks are.  Great care is taken when placing the radio frequency needles, block needles or lesioning probes, but sometimes complications can occur. 1. Infection: Any time there is an injection through the skin, there is a risk of infection.  This is why sterile conditions are used for these blocks.  There are four possible types of infection. 1. Localized skin infection. 2. Central Nervous System Infection-This can be in the form of Meningitis, which can be deadly. 3. Epidural Infections-This can be in the form of an epidural abscess, which can cause pressure inside of the spine, causing compression of the spinal cord with subsequent paralysis. This would require an emergency surgery to decompress, and there are no guarantees that the patient would recover from the paralysis. 4. Discitis-This is an infection of the intervertebral discs.  It occurs in about 1% of discography procedures.  It is difficult to treat and it may lead to surgery.        2. Pain: the needles have to go through skin and soft tissues, will cause soreness.       3. Damage to internal structures:  The nerves to be lesioned may be near blood vessels or    other nerves which can be potentially damaged.       4. Bleeding: Bleeding is more common if the patient is taking blood thinners such as  aspirin, Coumadin, Ticiid, Plavix, etc., or if he/she have some genetic predisposition  such as hemophilia. Bleeding into the spinal canal can cause compression of the spinal  cord with subsequent paralysis.  This would require an emergency surgery to  decompress and there are no  guarantees that the patient would recover from the  paralysis.       5. Pneumothorax:  Puncturing of a  lung is a possibility, every time a needle is introduced in  the area of the chest or upper back.  Pneumothorax refers to free air around the  collapsed lung(s), inside of the thoracic cavity (chest cavity).  Another two possible  complications related to a similar event would include: Hemothorax and Chylothorax.   These are variations of the Pneumothorax, where instead of air around the collapsed  lung(s), you may have blood or chyle, respectively.       6. Spinal headaches: They may occur with any procedures in the area of the spine.       7. Persistent CSF (Cerebro-Spinal Fluid) leakage: This is a rare problem, but may occur  with prolonged intrathecal or epidural catheters either due to the formation of a fistulous  track or a dural tear.       8. Nerve damage: By working so close to the spinal cord, there is always a possibility of  nerve damage, which could be as serious as a permanent spinal cord injury with  paralysis.       9. Death:  Although rare, severe deadly allergic reactions known as "Anaphylactic  reaction" can occur to any of the medications used.      10. Worsening of the symptoms:  We can always make thing worse.  What are the chances of something like this happening? Chances of any of this occuring are extremely low.  By statistics, you have more of a chance of getting killed in a motor vehicle accident: while driving to the hospital than any of the above occurring .  Nevertheless, you should be aware that they are possibilities.  In general, it is similar to taking a shower.  Everybody knows that you can slip, hit your head and get killed.  Does that mean that you should not shower again?  Nevertheless always keep in mind that statistics do not mean anything if you happen to be on the wrong side of them.  Even if a procedure has a 1 (one) in a 1,000,000 (million) chance of going wrong, it you happen to be that one..Also, keep in mind that by statistics, you have more of a chance of  having something go wrong when taking medications.  Who should not have this procedure? If you are on a blood thinning medication (e.g. Coumadin, Plavix, see list of "Blood Thinners"), or if you have an active infection going on, you should not have the procedure.  If you are taking any blood thinners, please inform your physician.  How should I prepare for this procedure?  Do not eat or drink anything at least six hours prior to the procedure.  Bring a driver with you .  It cannot be a taxi.  Come accompanied by an adult that can drive you back, and that is strong enough to help you if your legs get weak or numb from the local anesthetic.  Take all of your medicines the morning of the procedure with just enough water to swallow them.  If you have diabetes, make sure that you are scheduled to have your procedure done first thing in the morning, whenever possible.  If you have diabetes, take only half of your insulin dose and notify our nurse that you have done so as soon as you arrive at the clinic.  If you are diabetic, but only  take blood sugar pills (oral hypoglycemic), then do not take them on the morning of your procedure.  You may take them after you have had the procedure.  Do not take aspirin or any aspirin-containing medications, at least eleven (11) days prior to the procedure.  They may prolong bleeding.  Wear loose fitting clothing that may be easy to take off and that you would not mind if it got stained with Betadine or blood.  Do not wear any jewelry or perfume  Remove any nail coloring.  It will interfere with some of our monitoring equipment.  NOTE: Remember that this is not meant to be interpreted as a complete list of all possible complications.  Unforeseen problems may occur.  BLOOD THINNERS The following drugs contain aspirin or other products, which can cause increased bleeding during surgery and should not be taken for 2 weeks prior to and 1 week after surgery.   If you should need take something for relief of minor pain, you may take acetaminophen which is found in Tylenol,m Datril, Anacin-3 and Panadol. It is not blood thinner. The products listed below are.  Do not take any of the products listed below in addition to any listed on your instruction sheet.  A.P.C or A.P.C with Codeine Codeine Phosphate Capsules #3 Ibuprofen Ridaura  ABC compound Congesprin Imuran rimadil  Advil Cope Indocin Robaxisal  Alka-Seltzer Effervescent Pain Reliever and Antacid Coricidin or Coricidin-D  Indomethacin Rufen  Alka-Seltzer plus Cold Medicine Cosprin Ketoprofen S-A-C Tablets  Anacin Analgesic Tablets or Capsules Coumadin Korlgesic Salflex  Anacin Extra Strength Analgesic tablets or capsules CP-2 Tablets Lanoril Salicylate  Anaprox Cuprimine Capsules Levenox Salocol  Anexsia-D Dalteparin Magan Salsalate  Anodynos Darvon compound Magnesium Salicylate Sine-off  Ansaid Dasin Capsules Magsal Sodium Salicylate  Anturane Depen Capsules Marnal Soma  APF Arthritis pain formula Dewitt's Pills Measurin Stanback  Argesic Dia-Gesic Meclofenamic Sulfinpyrazone  Arthritis Bayer Timed Release Aspirin Diclofenac Meclomen Sulindac  Arthritis pain formula Anacin Dicumarol Medipren Supac  Analgesic (Safety coated) Arthralgen Diffunasal Mefanamic Suprofen  Arthritis Strength Bufferin Dihydrocodeine Mepro Compound Suprol  Arthropan liquid Dopirydamole Methcarbomol with Aspirin Synalgos  ASA tablets/Enseals Disalcid Micrainin Tagament  Ascriptin Doan's Midol Talwin  Ascriptin A/D Dolene Mobidin Tanderil  Ascriptin Extra Strength Dolobid Moblgesic Ticlid  Ascriptin with Codeine Doloprin or Doloprin with Codeine Momentum Tolectin  Asperbuf Duoprin Mono-gesic Trendar  Aspergum Duradyne Motrin or Motrin IB Triminicin  Aspirin plain, buffered or enteric coated Durasal Myochrisine Trigesic  Aspirin Suppositories Easprin Nalfon Trillsate  Aspirin with Codeine Ecotrin Regular or Extra  Strength Naprosyn Uracel  Atromid-S Efficin Naproxen Ursinus  Auranofin Capsules Elmiron Neocylate Vanquish  Axotal Emagrin Norgesic Verin  Azathioprine Empirin or Empirin with Codeine Normiflo Vitamin E  Azolid Emprazil Nuprin Voltaren  Bayer Aspirin plain, buffered or children's or timed BC Tablets or powders Encaprin Orgaran Warfarin Sodium  Buff-a-Comp Enoxaparin Orudis Zorpin  Buff-a-Comp with Codeine Equegesic Os-Cal-Gesic   Buffaprin Excedrin plain, buffered or Extra Strength Oxalid   Bufferin Arthritis Strength Feldene Oxphenbutazone   Bufferin plain or Extra Strength Feldene Capsules Oxycodone with Aspirin   Bufferin with Codeine Fenoprofen Fenoprofen Pabalate or Pabalate-SF   Buffets II Flogesic Panagesic   Buffinol plain or Extra Strength Florinal or Florinal with Codeine Panwarfarin   Buf-Tabs Flurbiprofen Penicillamine   Butalbital Compound Four-way cold tablets Penicillin   Butazolidin Fragmin Pepto-Bismol   Carbenicillin Geminisyn Percodan   Carna Arthritis Reliever Geopen Persantine   Carprofen Gold's salt Persistin   Chloramphenicol Goody's Phenylbutazone  Chloromycetin Haltrain Piroxlcam   Clmetidine heparin Plaquenil   Cllnoril Hyco-pap Ponstel   Clofibrate Hydroxy chloroquine Propoxyphen         Before stopping any of these medications, be sure to consult the physician who ordered them.  Some, such as Coumadin (Warfarin) are ordered to prevent or treat serious conditions such as "deep thrombosis", "pumonary embolisms", and other heart problems.  The amount of time that you may need off of the medication may also vary with the medication and the reason for which you were taking it.  If you are taking any of these medications, please make sure you notify your pain physician before you undergo any procedures.         Radiofrequency Lesioning Radiofrequency lesioning is a procedure that is performed to relieve pain. The procedure is often used for back, neck, or  arm pain. Radiofrequency lesioning involves the use of a machine that creates radio waves to make heat. During the procedure, the heat is applied to the nerve that carries the pain signal. The heat damages the nerve and interferes with the pain signal. Pain relief usually starts about 2 weeks after the procedure and lasts for 6 months to 1 year. Tell a health care provider about:  Any allergies you have.  All medicines you are taking, including vitamins, herbs, eye drops, creams, and over-the-counter medicines.  Any problems you or family members have had with anesthetic medicines.  Any blood disorders you have.  Any surgeries you have had.  Any medical conditions you have.  Whether you are pregnant or may be pregnant. What are the risks? Generally, this is a safe procedure. However, problems may occur, including:  Pain or soreness at the injection site.  Infection at the injection site.  Damage to nerves or blood vessels.  What happens before the procedure?  Ask your health care provider about: ? Changing or stopping your regular medicines. This is especially important if you are taking diabetes medicines or blood thinners. ? Taking medicines such as aspirin and ibuprofen. These medicines can thin your blood. Do not take these medicines before your procedure if your health care provider instructs you not to.  Follow instructions from your health care provider about eating or drinking restrictions.  Plan to have someone take you home after the procedure.  If you go home right after the procedure, plan to have someone with you for 24 hours. What happens during the procedure?  You will be given one or more of the following: ? A medicine to help you relax (sedative). ? A medicine to numb the area (local anesthetic).  You will be awake during the procedure. You will need to be able to talk with the health care provider during the procedure.  With the help of a type of X-ray  (fluoroscopy), the health care provider will insert a radiofrequency needle into the area to be treated.  Next, a wire that carries the radio waves (electrode) will be put through the radiofrequency needle. An electrical pulse will be sent through the electrode to verify the correct nerve. You will feel a tingling sensation, and you may have muscle twitching.  Then, the tissue that is around the needle tip will be heated by an electric current that is passed using the radiofrequency machine. This will numb the nerves.  A bandage (dressing) will be put on the insertion area after the procedure is done. The procedure may vary among health care providers and hospitals. What happens after the  procedure?  Your blood pressure, heart rate, breathing rate, and blood oxygen level will be monitored often until the medicines you were given have worn off.  Return to your normal activities as directed by your health care provider. This information is not intended to replace advice given to you by your health care provider. Make sure you discuss any questions you have with your health care provider. Document Released: 05/11/2011 Document Revised: 02/18/2016 Document Reviewed: 10/20/2014 Elsevier Interactive Patient Education  [employer].

## 2018-04-02 NOTE — Progress Notes (Signed)
Nursing Pain Medication Assessment:  Safety precautions to be maintained throughout the outpatient stay will include: orient to surroundings, keep bed in low position, maintain call bell within reach at all times, provide assistance with transfer out of bed and ambulation.  Medication Inspection Compliance: Pill count conducted under aseptic conditions, in front of the patient. Neither the pills nor the bottle was removed from the patient's sight at any time. Once count was completed pills were immediately returned to the patient in their original bottle.  Medication: Oxycodone IR Pill/Patch Count: 45 of 90 pills remain Pill/Patch Appearance: Markings consistent with prescribed medication Bottle Appearance: Standard pharmacy container. Clearly labeled. Filled Date: 06 / 23 / 2019 Last Medication intake:  Yesterday

## 2018-04-02 NOTE — Patient Instructions (Addendum)
____________________________________________________________________________________________  Medication Rules  Applies to: All patients receiving prescriptions (written or electronic).  Pharmacy of record: Pharmacy where electronic prescriptions will be sent. If written prescriptions are taken to a different pharmacy, please inform the nursing staff. The pharmacy listed in the electronic medical record should be the one where you would like electronic prescriptions to be sent.  Prescription refills: Only during scheduled appointments. Applies to both, written and electronic prescriptions.  NOTE: The following applies primarily to controlled substances (Opioid* Pain Medications).   Patient's responsibilities: 1. Pain Pills: Bring all pain pills to every appointment (except for procedure appointments). 2. Pill Bottles: Bring pills in original pharmacy bottle. Always bring newest bottle. Bring bottle, even if empty. 3. Medication refills: You are responsible for knowing and keeping track of what medications you need refilled. The day before your appointment, write a list of all prescriptions that need to be refilled. Bring that list to your appointment and give it to the admitting nurse. Prescriptions will be written only during appointments. If you forget a medication, it will not be "Called in", "Faxed", or "electronically sent". You will need to get another appointment to get these prescribed. 4. Prescription Accuracy: You are responsible for carefully inspecting your prescriptions before leaving our office. Have the discharge nurse carefully go over each prescription with you, before taking them home. Make sure that your name is accurately spelled, that your address is correct. Check the name and dose of your medication to make sure it is accurate. Check the number of pills, and the written instructions to make sure they are clear and accurate. Make sure that you are given enough medication to last  until your next medication refill appointment. 5. Taking Medication: Take medication as prescribed. Never take more pills than instructed. Never take medication more frequently than prescribed. Taking less pills or less frequently is permitted and encouraged, when it comes to controlled substances (written prescriptions).  6. Inform other Doctors: Always inform, all of your healthcare providers, of all the medications you take. 7. Pain Medication from other Providers: You are not allowed to accept any additional pain medication from any other Doctor or Healthcare provider. There are two exceptions to this rule. (see below) In the event that you require additional pain medication, you are responsible for notifying us, as stated below. 8. Medication Agreement: You are responsible for carefully reading and following our Medication Agreement. This must be signed before receiving any prescriptions from our practice. Safely store a copy of your signed Agreement. Violations to the Agreement will result in no further prescriptions. (Additional copies of our Medication Agreement are available upon request.) 9. Laws, Rules, & Regulations: All patients are expected to follow all Federal and State Laws, Statutes, Rules, & Regulations. Ignorance of the Laws does not constitute a valid excuse. The use of any illegal substances is prohibited. 10. Adopted CDC guidelines & recommendations: Target dosing levels will be at or below 60 MME/day. Use of benzodiazepines** is not recommended.  Exceptions: There are only two exceptions to the rule of not receiving pain medications from other Healthcare Providers. 1. Exception #1 (Emergencies): In the event of an emergency (i.e.: accident requiring emergency care), you are allowed to receive additional pain medication. However, you are responsible for: As soon as you are able, call our office [phone], at any time of the day or night, and leave a message stating your name, the  date and nature of the emergency, and the name and dose of the medication   prescribed. In the event that your call is answered by a member of our staff, make sure to document and save the date, time, and the name of the person that took your information.  2. Exception #2 (Planned Surgery): In the event that you are scheduled by another doctor or dentist to have any type of surgery or procedure, you are allowed (for a period no longer than 30 days), to receive additional pain medication, for the acute post-op pain. However, in this case, you are responsible for picking up a copy of our "Post-op Pain Management for Surgeons" handout, and giving it to your surgeon or dentist. This document is available at our office, and does not require an appointment to obtain it. Simply go to our office during business hours (Monday-Thursday from 8:00 AM to 4:00 PM) (Friday 8:00 AM to 12:00 Noon) or if you have a scheduled appointment with [place], prior to your surgery, and ask for it by name. In addition, you will need to provide [place] with your name, name of your surgeon, type of surgery, and date of procedure or surgery.  *Opioid medications include: morphine, codeine, oxycodone, oxymorphone, hydrocodone, hydromorphone, meperidine, tramadol, tapentadol, buprenorphine, fentanyl, methadone. **Benzodiazepine medications include: diazepam (Valium), alprazolam (Xanax), clonazepam (Klonopine), lorazepam (Ativan), clorazepate (Tranxene), chlordiazepoxide (Librium), estazolam (Prosom), oxazepam (Serax), temazepam (Restoril), triazolam (Halcion) (Last updated: 11/23/2017) ____________________________________________________________________________________________   ____________________________________________________________________________________________  Medication Rules  Applies to: All patients receiving prescriptions (written or electronic).  Pharmacy of record: Pharmacy where electronic prescriptions will be sent. If written  prescriptions are taken to a different pharmacy, please inform the nursing staff. The pharmacy listed in the electronic medical record should be the one where you would like electronic prescriptions to be sent.  Prescription refills: Only during scheduled appointments. Applies to both, written and electronic prescriptions.  NOTE: The following applies primarily to controlled substances (Opioid* Pain Medications).   Patient's responsibilities: 11. Pain Pills: Bring all pain pills to every appointment (except for procedure appointments). 12. Pill Bottles: Bring pills in original pharmacy bottle. Always bring newest bottle. Bring bottle, even if empty. 13. Medication refills: You are responsible for knowing and keeping track of what medications you need refilled. The day before your appointment, write a list of all prescriptions that need to be refilled. Bring that list to your appointment and give it to the admitting nurse. Prescriptions will be written only during appointments. If you forget a medication, it will not be "Called in", "Faxed", or "electronically sent". You will need to get another appointment to get these prescribed. 14. Prescription Accuracy: You are responsible for carefully inspecting your prescriptions before leaving our office. Have the discharge nurse carefully go over each prescription with you, before taking them home. Make sure that your name is accurately spelled, that your address is correct. Check the name and dose of your medication to make sure it is accurate. Check the number of pills, and the written instructions to make sure they are clear and accurate. Make sure that you are given enough medication to last until your next medication refill appointment. 15. Taking Medication: Take medication as prescribed. Never take more pills than instructed. Never take medication more frequently than prescribed. Taking less pills or less frequently is permitted and encouraged, when it  comes to controlled substances (written prescriptions).  16. Inform other Doctors: Always inform, all of your healthcare providers, of all the medications you take. 17. Pain Medication from other Providers: You are not allowed to accept any additional pain medication from  any other Doctor or Healthcare provider. There are two exceptions to this rule. (see below) In the event that you require additional pain medication, you are responsible for notifying us, as stated below. 18. Medication Agreement: You are responsible for carefully reading and following our Medication Agreement. This must be signed before receiving any prescriptions from our practice. Safely store a copy of your signed Agreement. Violations to the Agreement will result in no further prescriptions. (Additional copies of our Medication Agreement are available upon request.) 19. Laws, Rules, & Regulations: All patients are expected to follow all Federal and [employer], Rules, [employer]. Ignorance of the Laws does not constitute a valid excuse. The use of any illegal substances is prohibited. 20. Adopted CDC guidelines & recommendations: Target dosing levels will be at or below 60 MME/day. Use of benzodiazepines** is not recommended.  Exceptions: There are only two exceptions to the rule of not receiving pain medications from other Healthcare Providers. 3. Exception #1 (Emergencies): In the event of an emergency (i.e.: accident requiring emergency care), you are allowed to receive additional pain medication. However, you are responsible for: As soon as you are able, call our office [phone], at any time of the day or night, and leave a message stating your name, the date and nature of the emergency, and the name and dose of the medication prescribed. In the event that your call is answered by a member of our staff, make sure to document and save the date, time, and the name of the person that took your information.   4. Exception #2 (Planned Surgery): In the event that you are scheduled by another doctor or dentist to have any type of surgery or procedure, you are allowed (for a period no longer than 30 days), to receive additional pain medication, for the acute post-op pain. However, in this case, you are responsible for picking up a copy of our "Post-op Pain Management for Surgeons" handout, and giving it to your surgeon or dentist. This document is available at our office, and does not require an appointment to obtain it. Simply go to our office during business hours (Monday-Thursday from 8:00 AM to 4:00 PM) (Friday 8:00 AM to 12:00 Noon) or if you have a scheduled appointment with [place], prior to your surgery, and ask for it by name. In addition, you will need to provide [place] with your name, name of your surgeon, type of surgery, and date of procedure or surgery.  *Opioid medications include: morphine, codeine, oxycodone, oxymorphone, hydrocodone, hydromorphone, meperidine, tramadol, tapentadol, buprenorphine, fentanyl, methadone. **Benzodiazepine medications include: diazepam (Valium), alprazolam (Xanax), clonazepam (Klonopine), lorazepam (Ativan), clorazepate (Tranxene), chlordiazepoxide (Librium), estazolam (Prosom), oxazepam (Serax), temazepam (Restoril), triazolam (Halcion) (Last updated: 11/23/2017) ____________________________________________________________________________________________   GENERAL RISKS AND COMPLICATIONS  What are the risk, side effects and possible complications? Generally speaking, most procedures are safe.  However, with any procedure there are risks, side effects, and the possibility of complications.  The risks and complications are dependent upon the sites that are lesioned, or the type of nerve block to be performed.  The closer the procedure is to the spine, the more serious the risks are.  Great care is taken when placing the radio frequency needles, block needles or lesioning  probes, but sometimes complications can occur. 1. Infection: Any time there is an injection through the skin, there is a risk of infection.  This is why sterile conditions are used for these blocks.  There are four possible types of infection. 1.  Localized skin infection. 2. Central Nervous System Infection-This can be in the form of Meningitis, which can be deadly. 3. Epidural Infections-This can be in the form of an epidural abscess, which can cause pressure inside of the spine, causing compression of the spinal cord with subsequent paralysis. This would require an emergency surgery to decompress, and there are no guarantees that the patient would recover from the paralysis. 4. Discitis-This is an infection of the intervertebral discs.  It occurs in about 1% of discography procedures.  It is difficult to treat and it may lead to surgery.        2. Pain: the needles have to go through skin and soft tissues, will cause soreness.       3. Damage to internal structures:  The nerves to be lesioned may be near blood vessels or    other nerves which can be potentially damaged.       4. Bleeding: Bleeding is more common if the patient is taking blood thinners such as  aspirin, Coumadin, Ticiid, Plavix, etc., or if he/she have some genetic predisposition  such as hemophilia. Bleeding into the spinal canal can cause compression of the spinal  cord with subsequent paralysis.  This would require an emergency surgery to  decompress and there are no guarantees that the patient would recover from the  paralysis.       5. Pneumothorax:  Puncturing of a lung is a possibility, every time a needle is introduced in  the area of the chest or upper back.  Pneumothorax refers to free air around the  collapsed lung(s), inside of the thoracic cavity (chest cavity).  Another two possible  complications related to a similar event would include: Hemothorax and Chylothorax.   These are variations of the Pneumothorax, where instead  of air around the collapsed  lung(s), you may have blood or chyle, respectively.       6. Spinal headaches: They may occur with any procedures in the area of the spine.       7. Persistent CSF (Cerebro-Spinal Fluid) leakage: This is a rare problem, but may occur  with prolonged intrathecal or epidural catheters either due to the formation of a fistulous  track or a dural tear.       8. Nerve damage: By working so close to the spinal cord, there is always a possibility of  nerve damage, which could be as serious as a permanent spinal cord injury with  paralysis.       9. Death:  Although rare, severe deadly allergic reactions known as "Anaphylactic  reaction" can occur to any of the medications used.      10. Worsening of the symptoms:  We can always make thing worse.  What are the chances of something like this happening? Chances of any of this occuring are extremely low.  By statistics, you have more of a chance of getting killed in a motor vehicle accident: while driving to the hospital than any of the above occurring .  Nevertheless, you should be aware that they are possibilities.  In general, it is similar to taking a shower.  Everybody knows that you can slip, hit your head and get killed.  Does that mean that you should not shower again?  Nevertheless always keep in mind that statistics do not mean anything if you happen to be on the wrong side of them.  Even if a procedure has a 1 (one) in a 1,000,000 (million) chance of going  wrong, it you happen to be that one..Also, keep in mind that by statistics, you have more of a chance of having something go wrong when taking medications.  Who should not have this procedure? If you are on a blood thinning medication (e.g. Coumadin, Plavix, see list of "Blood Thinners"), or if you have an active infection going on, you should not have the procedure.  If you are taking any blood thinners, please inform your physician.  How should I prepare for this  procedure?  Do not eat or drink anything at least six hours prior to the procedure.  Bring a driver with you .  It cannot be a taxi.  Come accompanied by an adult that can drive you back, and that is strong enough to help you if your legs get weak or numb from the local anesthetic.  Take all of your medicines the morning of the procedure with just enough water to swallow them.  If you have diabetes, make sure that you are scheduled to have your procedure done first thing in the morning, whenever possible.  If you have diabetes, take only half of your insulin dose and notify our nurse that you have done so as soon as you arrive at the clinic.  If you are diabetic, but only take blood sugar pills (oral hypoglycemic), then do not take them on the morning of your procedure.  You may take them after you have had the procedure.  Do not take aspirin or any aspirin-containing medications, at least eleven (11) days prior to the procedure.  They may prolong bleeding.  Wear loose fitting clothing that may be easy to take off and that you would not mind if it got stained with Betadine or blood.  Do not wear any jewelry or perfume  Remove any nail coloring.  It will interfere with some of our monitoring equipment.  NOTE: Remember that this is not meant to be interpreted as a complete list of all possible complications.  Unforeseen problems may occur.  BLOOD THINNERS The following drugs contain aspirin or other products, which can cause increased bleeding during surgery and should not be taken for 2 weeks prior to and 1 week after surgery.  If you should need take something for relief of minor pain, you may take acetaminophen which is found in Tylenol,m Datril, Anacin-3 and Panadol. It is not blood thinner. The products listed below are.  Do not take any of the products listed below in addition to any listed on your instruction sheet.  A.P.C or A.P.C with Codeine Codeine Phosphate Capsules #3  Ibuprofen Ridaura  ABC compound Congesprin Imuran rimadil  Advil Cope Indocin Robaxisal  Alka-Seltzer Effervescent Pain Reliever and Antacid Coricidin or Coricidin-D  Indomethacin Rufen  Alka-Seltzer plus Cold Medicine Cosprin Ketoprofen S-A-C Tablets  Anacin Analgesic Tablets or Capsules Coumadin Korlgesic Salflex  Anacin Extra Strength Analgesic tablets or capsules CP-2 Tablets Lanoril Salicylate  Anaprox Cuprimine Capsules Levenox Salocol  Anexsia-D Dalteparin Magan Salsalate  Anodynos Darvon compound Magnesium Salicylate Sine-off  Ansaid Dasin Capsules Magsal Sodium Salicylate  Anturane Depen Capsules Marnal Soma  APF Arthritis pain formula Dewitt's Pills Measurin Stanback  Argesic Dia-Gesic Meclofenamic Sulfinpyrazone  Arthritis Bayer Timed Release Aspirin Diclofenac Meclomen Sulindac  Arthritis pain formula Anacin Dicumarol Medipren Supac  Analgesic (Safety coated) Arthralgen Diffunasal Mefanamic Suprofen  Arthritis Strength Bufferin Dihydrocodeine Mepro Compound Suprol  Arthropan liquid Dopirydamole Methcarbomol with Aspirin Synalgos  ASA tablets/Enseals Disalcid Micrainin Tagament  Ascriptin Doan's Midol Talwin  Ascriptin A/D  Dolene Mobidin Tanderil  Ascriptin Extra Strength Dolobid Moblgesic Ticlid  Ascriptin with Codeine Doloprin or Doloprin with Codeine Momentum Tolectin  Asperbuf Duoprin Mono-gesic Trendar  Aspergum Duradyne Motrin or Motrin IB Triminicin  Aspirin plain, buffered or enteric coated Durasal Myochrisine Trigesic  Aspirin Suppositories Easprin Nalfon Trillsate  Aspirin with Codeine Ecotrin Regular or Extra Strength Naprosyn Uracel  Atromid-S Efficin Naproxen Ursinus  Auranofin Capsules Elmiron Neocylate Vanquish  Axotal Emagrin Norgesic Verin  Azathioprine Empirin or Empirin with Codeine Normiflo Vitamin E  Azolid Emprazil Nuprin Voltaren  Bayer Aspirin plain, buffered or children's or timed BC Tablets or powders Encaprin Orgaran Warfarin Sodium   Buff-a-Comp Enoxaparin Orudis Zorpin  Buff-a-Comp with Codeine Equegesic Os-Cal-Gesic   Buffaprin Excedrin plain, buffered or Extra Strength Oxalid   Bufferin Arthritis Strength Feldene Oxphenbutazone   Bufferin plain or Extra Strength Feldene Capsules Oxycodone with Aspirin   Bufferin with Codeine Fenoprofen Fenoprofen Pabalate or Pabalate-SF   Buffets II Flogesic Panagesic   Buffinol plain or Extra Strength Florinal or Florinal with Codeine Panwarfarin   Buf-Tabs Flurbiprofen Penicillamine   Butalbital Compound Four-way cold tablets Penicillin   Butazolidin Fragmin Pepto-Bismol   Carbenicillin Geminisyn Percodan   Carna Arthritis Reliever Geopen Persantine   Carprofen Gold's salt Persistin   Chloramphenicol Goody's Phenylbutazone   Chloromycetin Haltrain Piroxlcam   Clmetidine heparin Plaquenil   Cllnoril Hyco-pap Ponstel   Clofibrate Hydroxy chloroquine Propoxyphen         Before stopping any of these medications, be sure to consult the physician who ordered them.  Some, such as Coumadin (Warfarin) are ordered to prevent or treat serious conditions such as "deep thrombosis", "pumonary embolisms", and other heart problems.  The amount of time that you may need off of the medication may also vary with the medication and the reason for which you were taking it.  If you are taking any of these medications, please make sure you notify your pain physician before you undergo any procedures.         Radiofrequency Lesioning Radiofrequency lesioning is a procedure that is performed to relieve pain. The procedure is often used for back, neck, or arm pain. Radiofrequency lesioning involves the use of a machine that creates radio waves to make heat. During the procedure, the heat is applied to the nerve that carries the pain signal. The heat damages the nerve and interferes with the pain signal. Pain relief usually starts about 2 weeks after the procedure and lasts for 6 months to 1  year. Tell a health care provider about:  Any allergies you have.  All medicines you are taking, including vitamins, herbs, eye drops, creams, and over-the-counter medicines.  Any problems you or family members have had with anesthetic medicines.  Any blood disorders you have.  Any surgeries you have had.  Any medical conditions you have.  Whether you are pregnant or may be pregnant. What are the risks? Generally, this is a safe procedure. However, problems may occur, including:  Pain or soreness at the injection site.  Infection at the injection site.  Damage to nerves or blood vessels.  What happens before the procedure?  Ask your health care provider about: ? Changing or stopping your regular medicines. This is especially important if you are taking diabetes medicines or blood thinners. ? Taking medicines such as aspirin and ibuprofen. These medicines can thin your blood. Do not take these medicines before your procedure if your health care provider instructs you not to.  Follow instructions from your  health care provider about eating or drinking restrictions.  Plan to have someone take you home after the procedure.  If you go home right after the procedure, plan to have someone with you for 24 hours. What happens during the procedure?  You will be given one or more of the following: ? A medicine to help you relax (sedative). ? A medicine to numb the area (local anesthetic).  You will be awake during the procedure. You will need to be able to talk with the health care provider during the procedure.  With the help of a type of X-ray (fluoroscopy), the health care provider will insert a radiofrequency needle into the area to be treated.  Next, a wire that carries the radio waves (electrode) will be put through the radiofrequency needle. An electrical pulse will be sent through the electrode to verify the correct nerve. You will feel a tingling sensation, and you may  have muscle twitching.  Then, the tissue that is around the needle tip will be heated by an electric current that is passed using the radiofrequency machine. This will numb the nerves.  A bandage (dressing) will be put on the insertion area after the procedure is done. The procedure may vary among health care providers and hospitals. What happens after the procedure?  Your blood pressure, heart rate, breathing rate, and blood oxygen level will be monitored often until the medicines you were given have worn off.  Return to your normal activities as directed by your health care provider. This information is not intended to replace advice given to you by your health care provider. Make sure you discuss any questions you have with your health care provider. Document Released: 05/11/2011 Document Revised: 02/18/2016 Document Reviewed: 10/20/2014 Elsevier Interactive Patient Education  [employer].

## 2018-04-07 LAB — TOXASSURE SELECT 13 (MW), URINE

## 2018-04-17 ENCOUNTER — Ambulatory Visit
Admission: RE | Admit: 2018-04-17 | Discharge: 2018-04-17 | Disposition: A | Discharge status: Home / Self Care | Payer: Medicare Other | Source: Ambulatory Visit | Attending: Pain Medicine | Admitting: Pain Medicine

## 2018-04-17 ENCOUNTER — Ambulatory Visit (HOSPITAL_BASED_OUTPATIENT_CLINIC_OR_DEPARTMENT_OTHER): Payer: Medicare Other | Admitting: Pain Medicine

## 2018-04-17 ENCOUNTER — Encounter: Payer: Self-pay | Admitting: Pain Medicine

## 2018-04-17 ENCOUNTER — Other Ambulatory Visit: Payer: Self-pay

## 2018-04-17 VITALS — BP 115/78 | HR 70 | Temp 98.3°F | Resp 17 | Ht 68.0 in | Wt 120.0 lb

## 2018-04-17 DIAGNOSIS — M47817 Spondylosis without myelopathy or radiculopathy, lumbosacral region: Secondary | ICD-10-CM | POA: Insufficient documentation

## 2018-04-17 DIAGNOSIS — G8929 Other chronic pain: Secondary | ICD-10-CM | POA: Diagnosis not present

## 2018-04-17 DIAGNOSIS — M545 Low back pain: Secondary | ICD-10-CM | POA: Insufficient documentation

## 2018-04-17 DIAGNOSIS — M47816 Spondylosis without myelopathy or radiculopathy, lumbar region: Secondary | ICD-10-CM

## 2018-04-17 MED ORDER — LACTATED RINGERS IV SOLN
1000.0000 mL | Freq: Once | INTRAVENOUS | Status: AC
  Administered 2018-04-17: 1000 mL via INTRAVENOUS

## 2018-04-17 MED ORDER — TRIAMCINOLONE ACETONIDE 40 MG/ML IJ SUSP
40.0000 mg | Freq: Once | INTRAMUSCULAR | Status: AC
  Administered 2018-04-17: 40 mg
  Filled 2018-04-17: qty 1

## 2018-04-17 MED ORDER — ROPIVACAINE HCL 2 MG/ML IJ SOLN
9.0000 mL | Freq: Once | INTRAMUSCULAR | Status: AC
  Administered 2018-04-17: 10 mL via PERINEURAL
  Filled 2018-04-17: qty 10

## 2018-04-17 MED ORDER — LIDOCAINE HCL 2 % IJ SOLN
20.0000 mL | Freq: Once | INTRAMUSCULAR | Status: AC
  Administered 2018-04-17: 400 mg
  Filled 2018-04-17: qty 40

## 2018-04-17 MED ORDER — FENTANYL CITRATE (PF) 100 MCG/2ML IJ SOLN
25.0000 ug | INTRAMUSCULAR | Status: DC | PRN
  Administered 2018-04-17: 100 ug via INTRAVENOUS
  Filled 2018-04-17: qty 2

## 2018-04-17 MED ORDER — MIDAZOLAM HCL 5 MG/5ML IJ SOLN
1.0000 mg | INTRAMUSCULAR | Status: DC | PRN
  Administered 2018-04-17: 2 mg via INTRAVENOUS
  Filled 2018-04-17: qty 5

## 2018-04-17 NOTE — Patient Instructions (Addendum)
____________________________________________________________________________________________  Post-Procedure Discharge Instructions  Please fill out and bring pain diary to you next appointment.   Instructions:  Apply ice: Fill a plastic sandwich bag with crushed ice. Cover it with a small towel and apply to injection site. Apply for 15 minutes then remove x 15 minutes. Repeat sequence on day of procedure, until you go to bed. The purpose is to minimize swelling and discomfort after procedure.  Apply heat: Apply heat to procedure site starting the day following the procedure. The purpose is to treat any soreness and discomfort from the procedure.  Food intake: Start with clear liquids (like water) and advance to regular food, as tolerated.   Physical activities: Keep activities to a minimum for the first 8 hours after the procedure.   Driving: If you have received any sedation, you are not allowed to drive for 24 hours after your procedure.  Blood thinner: Restart your blood thinner 6 hours after your procedure. (Only for those taking blood thinners)  Insulin: As soon as you can eat, you may resume your normal dosing schedule. (Only for those taking insulin)  Infection prevention: Keep procedure site clean and dry.  Post-procedure Pain Diary: Extremely important that this be done correctly and accurately. Recorded information will be used to determine the next step in treatment.  Pain evaluated is that of treated area only. Do not include pain from an untreated area.  Complete every hour, on the hour, for the initial 8 hours. Set an alarm to help you do this part accurately.  Do not go to sleep and have it completed later. It will not be accurate.  Follow-up appointment: Keep your follow-up appointment after the procedure. Usually 2 weeks for most procedures. (6 weeks in the case of radiofrequency.) Bring you pain diary.   Expect:  From numbing medicine (AKA: Local Anesthetics):  Numbness or decrease in pain.  Onset: Full effect within 15 minutes of injected.  Duration: It will depend on the type of local anesthetic used. On the average, 1 to 8 hours.   From steroids: Decrease in swelling or inflammation. Once inflammation is improved, relief of the pain will follow.  Onset of benefits: Depends on the amount of swelling present. The more swelling, the longer it will take for the benefits to be seen. In some cases, up to 10 days.  Duration: Steroids will stay in the system x 2 weeks. Duration of benefits will depend on multiple posibilities including persistent irritating factors.  From procedure: Some discomfort is to be expected once the numbing medicine wears off. This should be minimal if ice and heat are applied as instructed.  Call if:  You experience numbness and weakness that gets worse with time, as opposed to wearing off.  New onset bowel or bladder incontinence. (This applies to Spinal procedures only)  Emergency Numbers:  Durning business hours (Monday - Thursday, 8:00 AM - 4:00 PM) (Friday, 9:00 AM - 12:00 Noon): [phone]  After hours: [phone] ____________________________________________________________________________________________   ____________________________________________________________________________________________  Preparing for Procedure with Sedation  Instructions: . Oral Intake: Do not eat or drink anything for at least 8 hours prior to your procedure. . Transportation: Public transportation is not allowed. Bring an adult driver. The driver must be physically present in our waiting room before any procedure can be started. Marland Kitchen Physical Assistance: Bring an adult physically capable of assisting you, in the event you need help. This adult should keep you company at home for at least 6 hours after the  procedure. . Blood Pressure Medicine: Take your blood pressure medicine with a sip of water the morning of the  procedure. . Blood thinners: Notify our staff if you are taking any blood thinners. Depending on which one you take, there will be specific instructions on how and when to stop it. . Diabetics on insulin: Notify the staff so that you can be scheduled 1st case in the morning. If your diabetes requires high dose insulin, take only  of your normal insulin dose the morning of the procedure and notify the staff that you have done so. . Preventing infections: Shower with an antibacterial soap the morning of your procedure. . Build-up your immune system: Take 1000 mg of Vitamin C with every meal (3 times a day) the day prior to your procedure. Marland Kitchen Antibiotics: Inform the staff if you have a condition or reason that requires you to take antibiotics before dental procedures. . Pregnancy: If you are pregnant, call and cancel the procedure. . Sickness: If you have a cold, fever, or any active infections, call and cancel the procedure. . Arrival: You must be in the facility at least 30 minutes prior to your scheduled procedure. . Children: Do not bring children with you. . Dress appropriately: Bring dark clothing that you would not mind if they get stained. . Valuables: Do not bring any jewelry or valuables.  Procedure appointments are reserved for interventional treatments only. Marland Kitchen No Prescription Refills. . No medication changes will be discussed during procedure appointments. . No disability issues will be discussed.  Reasons to call and reschedule or cancel your procedure: (Following these recommendations will minimize the risk of a serious complication.) . Surgeries: Avoid having procedures within 2 weeks of any surgery. (Avoid for 2 weeks before or after any surgery). . Flu Shots: Avoid having procedures within 2 weeks of a flu shots or . (Avoid for 2 weeks before or after immunizations). . Barium: Avoid having a procedure within 7-10 days after having had a radiological study involving the use of  radiological contrast. (Myelograms, Barium swallow or enema study). . Heart attacks: Avoid any elective procedures or surgeries for the initial 6 months after a "Myocardial Infarction" (Heart Attack). . Blood thinners: It is imperative that you stop these medications before procedures. Let us know if you if you take any blood thinner.  . Infection: Avoid procedures during or within two weeks of an infection (including chest colds or gastrointestinal problems). Symptoms associated with infections include: Localized redness, fever, chills, night sweats or profuse sweating, burning sensation when voiding, cough, congestion, stuffiness, runny nose, sore throat, diarrhea, nausea, vomiting, cold or Flu symptoms, recent or current infections. It is specially important if the infection is over the area that we intend to treat. Marland Kitchen Heart and lung problems: Symptoms that may suggest an active cardiopulmonary problem include: cough, chest pain, breathing difficulties or shortness of breath, dizziness, ankle swelling, uncontrolled high or unusually low blood pressure, and/or palpitations. If you are experiencing any of these symptoms, cancel your procedure and contact your primary care physician for an evaluation.  Remember:  Regular Business hours are:  Monday to Thursday 8:00 AM to 4:00 PM  Provider's Schedule: Milinda Pointer, MD:  Procedure days: Tuesday and Thursday 7:30 AM to 4:00 PM  Gillis Santa, MD:  Procedure days: Monday and Wednesday 7:30 AM to 4:00 PM ____________________________________________________________________________________________

## 2018-04-17 NOTE — Progress Notes (Addendum)
Patient's Name: Mark Hodges  MRN: [id number]  Referring Provider: Tracie Harrier, MD  DOB: 15-Mar-1956  PCP: Tracie Harrier, MD  DOS: 04/17/2018  Note by: Gaspar Cola, MD  Service setting: Ambulatory outpatient  Specialty: Interventional Pain Management  Patient type: Established  Location: [facility] (AMB) Pain Management Facility  Visit type: Interventional Procedure   Primary Reason for Visit: Interventional Pain Management Treatment. CC: Back Pain (lower)  Procedure:          Anesthesia, Analgesia, Anxiolysis:  Type: Thermal Lumbar Facet, Medial Branch Radiofrequency Ablation/Neurotomy #2 (Last done on 12/19/16) Level: L2, L3, L4, L5, & S1 Medial Branch Level(s). These levels will denervate the L3-4, L4-5, and the L5-S1 lumbar facet joints. Primary Purpose: Therapeutic Region: Posterolateral Lumbosacral Spine Laterality: Left  Type: Moderate (Conscious) Sedation combined with Local Anesthesia Indication(s): Analgesia and Anxiety Route: Intravenous (IV) IV Access: Secured Sedation: Meaningful verbal contact was maintained at all times during the procedure  Local Anesthetic: Lidocaine 1-2%   Indications: 1. Spondylosis without myelopathy or radiculopathy, lumbosacral region   2. Lumbar facet arthropathy   3. Lumbar facet syndrome (Bilateral) (R>L)   4. Chronic low back pain (Primary Source of Pain) (Bilateral) (R>L)    Mark Hodges has been dealing with the above chronic pain for longer than three months and has either failed to respond, was unable to tolerate, or simply did not get enough benefit from other more conservative therapies including, but not limited to: 1. Over-the-counter medications 2. Anti-inflammatory medications 3. Muscle relaxants 4. Membrane stabilizers 5. Opioids 6. Physical therapy 7. Modalities (Heat, ice, etc.) 8. Invasive techniques such as nerve blocks. Mark Hodges has attained more than 50% relief of the pain from a series of diagnostic injections  conducted in separate occasions.  Pain Score: Pre-procedure: 5 /10 Post-procedure: 0-No pain/10  Pre-op Assessment:  Mark Hodges is a 62 y.o. (year old), male patient, seen today for interventional treatment. He  has a past surgical history that includes Tonsillectomy; Colonoscopy with propofol (N/A, 01/26/2016); and Esophagogastroduodenoscopy (egd) with propofol (N/A, 01/26/2016). Mark Hodges has a current medication list which includes the following prescription(s): alprostadil, aspirin, cyanocobalamin, diazepam, duloxetine, gabapentin, lamotrigine, nortriptyline, oxycodone hcl, oxycodone hcl, oxycodone hcl, sertraline, tamsulosin, venlafaxine hcl, and atorvastatin, and the following Facility-Administered Medications: fentanyl and midazolam. His primarily concern today is the Back Pain (lower)  Initial Vital Signs:  Pulse/HCG Rate: 85ECG Heart Rate: 84 Temp: 99.1 F (37.3 C) Resp: 16 BP: (!) 145/66 SpO2: 100 %  BMI: Estimated body mass index is 18.25 kg/m as calculated from the following:   Height as of this encounter: 5\' 8"  (1.727 m).   Weight as of this encounter: 120 lb (54.4 kg).  Risk Assessment: Allergies: Reviewed. He has No Known Allergies.  Allergy Precautions: None required Coagulopathies: Reviewed. None identified.  Blood-thinner therapy: None at this time Active Infection(s): Reviewed. None identified. Mark Hodges is afebrile  Site Confirmation: Mark Hodges was asked to confirm the procedure and laterality before marking the site Procedure checklist: Completed Consent: Before the procedure and under the influence of no sedative(s), amnesic(s), or anxiolytics, the patient was informed of the treatment options, risks and possible complications. To fulfill our ethical and legal obligations, as recommended by the American Medical Association's Code of Ethics, I have informed the patient of my clinical impression; the nature and purpose of the treatment or procedure; the risks,  benefits, and possible complications of the intervention; the alternatives, including doing nothing; the risk(s) and benefit(s) of the alternative treatment(s)  or procedure(s); and the risk(s) and benefit(s) of doing nothing. The patient was provided information about the general risks and possible complications associated with the procedure. These may include, but are not limited to: failure to achieve desired goals, infection, bleeding, organ or nerve damage, allergic reactions, paralysis, and death. In addition, the patient was informed of those risks and complications associated to Spine-related procedures, such as failure to decrease pain; infection (i.e.: Meningitis, epidural or intraspinal abscess); bleeding (i.e.: epidural hematoma, subarachnoid hemorrhage, or any other type of intraspinal or peri-dural bleeding); organ or nerve damage (i.e.: Any type of peripheral nerve, nerve root, or spinal cord injury) with subsequent damage to sensory, motor, and/or autonomic systems, resulting in permanent pain, numbness, and/or weakness of one or several areas of the body; allergic reactions; (i.e.: anaphylactic reaction); and/or death. Furthermore, the patient was informed of those risks and complications associated with the medications. These include, but are not limited to: allergic reactions (i.e.: anaphylactic or anaphylactoid reaction(s)); adrenal axis suppression; blood sugar elevation that in diabetics may result in ketoacidosis or comma; water retention that in patients with history of congestive heart failure may result in shortness of breath, pulmonary edema, and decompensation with resultant heart failure; weight gain; swelling or edema; medication-induced neural toxicity; particulate matter embolism and blood vessel occlusion with resultant organ, and/or nervous system infarction; and/or aseptic necrosis of one or more joints. Finally, the patient was informed that Medicine is not an exact science;  therefore, there is also the possibility of unforeseen or unpredictable risks and/or possible complications that may result in a catastrophic outcome. The patient indicated having understood very clearly. We have given the patient no guarantees and we have made no promises. Enough time was given to the patient to ask questions, all of which were answered to the patient's satisfaction. Mr. Daft has indicated that he wanted to continue with the procedure. Attestation: I, the ordering provider, attest that I have discussed with the patient the benefits, risks, side-effects, alternatives, likelihood of achieving goals, and potential problems during recovery for the procedure that I have provided informed consent. Date  Time: 04/17/2018  1:59 PM  Pre-Procedure Preparation:  Monitoring: As per clinic protocol. Respiration, ETCO2, SpO2, BP, heart rate and rhythm monitor placed and checked for adequate function Safety Precautions: Patient was assessed for positional comfort and pressure points before starting the procedure. Time-out: I initiated and conducted the "Time-out" before starting the procedure, as per protocol. The patient was asked to participate by confirming the accuracy of the "Time Out" information. Verification of the correct person, site, and procedure were performed and confirmed by me, the nursing staff, and the patient. "Time-out" conducted as per Joint Commission's Universal Protocol (UP.01.01.01). Time: 1455  Description of Procedure:          Position: Prone Laterality: Left Levels:  L2, L3, L4, L5, & S1 Medial Branch Level(s), at the L3-4, L4-5, and the L5-S1 lumbar facet joints. Area Prepped: Lumbosacral Prepping solution: ChloraPrep (2% chlorhexidine gluconate and 70% isopropyl alcohol) Safety Precautions: Aspiration looking for blood return was conducted prior to all injections. At no point did we inject any substances, as a needle was being advanced. Before injecting, the  patient was told to immediately notify me if he was experiencing any new onset of "ringing in the ears, or metallic taste in the mouth". No attempts were made at seeking any paresthesias. Safe injection practices and needle disposal techniques used. Medications properly checked for expiration dates. SDV (single dose vial) medications used.  After the completion of the procedure, all disposable equipment used was discarded in the proper designated medical waste containers. Local Anesthesia: Protocol guidelines were followed. The patient was positioned over the fluoroscopy table. The area was prepped in the usual manner. The time-out was completed. The target area was identified using fluoroscopy. A 12-in long, straight, sterile hemostat was used with fluoroscopic guidance to locate the targets for each level blocked. Once located, the skin was marked with an approved surgical skin marker. Once all sites were marked, the skin (epidermis, dermis, and hypodermis), as well as deeper tissues (fat, connective tissue and muscle) were infiltrated with a small amount of a short-acting local anesthetic, loaded on a 10cc syringe with a 25G, 1.5-in  Needle. An appropriate amount of time was allowed for local anesthetics to take effect before proceeding to the next step. Local Anesthetic: Lidocaine 2.0% The unused portion of the local anesthetic was discarded in the proper designated containers. Technical explanation of process:  Radiofrequency Ablation (RFA) L2 Medial Branch Nerve RFA: The target area for the L2 medial branch is at the junction of the postero-lateral aspect of the superior articular process and the superior, posterior, and medial edge of the transverse process of L3. Under fluoroscopic guidance, a Radiofrequency needle was inserted until contact was made with os over the superior postero-lateral aspect of the pedicular shadow (target area). Sensory and motor testing was conducted to properly adjust the  position of the needle. Once satisfactory placement of the needle was achieved, the numbing solution was slowly injected after negative aspiration for blood. 2.0 mL of the nerve block solution was injected without difficulty or complication. After waiting for at least 3 minutes, the ablation was performed. Once completed, the needle was removed intact. L3 Medial Branch Nerve RFA: The target area for the L3 medial branch is at the junction of the postero-lateral aspect of the superior articular process and the superior, posterior, and medial edge of the transverse process of L4. Under fluoroscopic guidance, a Radiofrequency needle was inserted until contact was made with os over the superior postero-lateral aspect of the pedicular shadow (target area). Sensory and motor testing was conducted to properly adjust the position of the needle. Once satisfactory placement of the needle was achieved, the numbing solution was slowly injected after negative aspiration for blood. 2.0 mL of the nerve block solution was injected without difficulty or complication. After waiting for at least 3 minutes, the ablation was performed. Once completed, the needle was removed intact. L4 Medial Branch Nerve RFA: The target area for the L4 medial branch is at the junction of the postero-lateral aspect of the superior articular process and the superior, posterior, and medial edge of the transverse process of L5. Under fluoroscopic guidance, a Radiofrequency needle was inserted until contact was made with os over the superior postero-lateral aspect of the pedicular shadow (target area). Sensory and motor testing was conducted to properly adjust the position of the needle. Once satisfactory placement of the needle was achieved, the numbing solution was slowly injected after negative aspiration for blood. 2.0 mL of the nerve block solution was injected without difficulty or complication. After waiting for at least 3 minutes, the ablation was  performed. Once completed, the needle was removed intact. L5 Medial Branch Nerve RFA: The target area for the L5 medial branch is at the junction of the postero-lateral aspect of the superior articular process of S1 and the superior, posterior, and medial edge of the sacral ala. Under fluoroscopic guidance, a  Radiofrequency needle was inserted until contact was made with os over the superior postero-lateral aspect of the pedicular shadow (target area). Sensory and motor testing was conducted to properly adjust the position of the needle. Once satisfactory placement of the needle was achieved, the numbing solution was slowly injected after negative aspiration for blood. 2.0 mL of the nerve block solution was injected without difficulty or complication. After waiting for at least 3 minutes, the ablation was performed. Once completed, the needle was removed intact. S1 Medial Branch Nerve RFA: The target area for the S1 medial branch is located inferior to the junction of the S1 superior articular process and the L5 inferior articular process, posterior, inferior, and lateral to the 6 o'clock position of the L5-S1 facet joint, just superior to the S1 posterior foramen. Under fluoroscopic guidance, the Radiofrequency needle was advanced until contact was made with os over the Target area. Sensory and motor testing was conducted to properly adjust the position of the needle. Once satisfactory placement of the needle was achieved, the numbing solution was slowly injected after negative aspiration for blood. 2.0 mL of the nerve block solution was injected without difficulty or complication. After waiting for at least 3 minutes, the ablation was performed. Once completed, the needle was removed intact. Radiofrequency lesioning (ablation):  Radiofrequency Generator: NeuroTherm [id number] Sensory Stimulation Parameters: 50 Hz was used to locate & identify the nerve, making sure that the needle was positioned such that there  was no sensory stimulation below 0.3 V or above 0.7 V. Motor Stimulation Parameters: 2 Hz was used to evaluate the motor component. Care was taken not to lesion any nerves that demonstrated motor stimulation of the lower extremities at an output of less than 2.5 times that of the sensory threshold, or a maximum of 2.0 V. Lesioning Technique Parameters: Standard Radiofrequency settings. (Not bipolar or pulsed.) Temperature Settings: 80 degrees C Lesioning time: 60 seconds Intra-operative Compliance: Compliant Materials & Medications: Needle(s) (Electrode/Cannula) Type: Teflon-coated, curved tip, Radiofrequency needle(s) Gauge: 22G Length: 10cm Numbing solution: 0.2% PF-Ropivacaine + Triamcinolone (40 mg/mL) diluted to a final concentration of 4 mg of Triamcinolone/mL of Ropivacaine The unused portion of the solution was discarded in the proper designated containers.  Once the entire procedure was completed, the treated area was cleaned, making sure to leave some of the prepping solution back to take advantage of its long term bactericidal properties.  Illustration of the posterior view of the lumbar spine and the posterior neural structures. Laminae of L2 through S1 are labeled. DPRL5, dorsal primary ramus of L5; DPRS1, dorsal primary ramus of S1; DPR3, dorsal primary ramus of L3; FJ, facet (zygapophyseal) joint L3-L4; I, inferior articular process of L4; LB1, lateral branch of dorsal primary ramus of L1; IAB, inferior articular branches from L3 medial branch (supplies L4-L5 facet joint); IBP, intermediate branch plexus; MB3, medial branch of dorsal primary ramus of L3; NR3, third lumbar nerve root; S, superior articular process of L5; SAB, superior articular branches from L4 (supplies L4-5 facet joint also); TP3, transverse process of L3.  Vitals:   04/17/18 1523 04/17/18 1533 04/17/18 1543 04/17/18 1553  BP: 125/73 128/84 126/80 115/78  Pulse:  75 76 70  Resp: 19 12 17 17   Temp:  98.4 F (36.9  C)  98.3 F (36.8 C)  TempSrc:  Temporal  Temporal  SpO2: 99% 100% 100% 100%  Weight:      Height:        Start Time: 1455 hrs. End Time: 1522 hrs.  Imaging Guidance (Spinal):          Type of Imaging Technique: Fluoroscopy Guidance (Spinal) Indication(s): Assistance in needle guidance and placement for procedures requiring needle placement in or near specific anatomical locations not easily accessible without such assistance. Exposure Time: Please see nurses notes. Contrast: None used. Fluoroscopic Guidance: I was personally present during the use of fluoroscopy. "Tunnel Vision Technique" used to obtain the best possible view of the target area. Parallax error corrected before commencing the procedure. "Direction-depth-direction" technique used to introduce the needle under continuous pulsed fluoroscopy. Once target was reached, antero-posterior, oblique, and lateral fluoroscopic projection used confirm needle placement in all planes. Images permanently stored in EMR.    Interpretation: No contrast injected. I personally interpreted the imaging intraoperatively. Adequate needle placement confirmed in multiple planes. Permanent images saved into the patient's record.  Antibiotic Prophylaxis:   Anti-infectives (From admission, onward)   None     Indication(s): None identified  Post-operative Assessment:  Post-procedure Vital Signs:  Pulse/HCG Rate: 7080 Temp: 98.3 F (36.8 C) Resp: 17 BP: 115/78 SpO2: 100 %  EBL: None  Complications: No immediate post-treatment complications observed by team, or reported by patient.  Note: The patient tolerated the entire procedure well. A repeat set of vitals were taken after the procedure and the patient was kept under observation following institutional policy, for this type of procedure. Post-procedural neurological assessment was performed, showing return to baseline, prior to discharge. The patient was provided with post-procedure  discharge instructions, including a section on how to identify potential problems. Should any problems arise concerning this procedure, the patient was given instructions to immediately contact us, at any time, without hesitation. In any case, we plan to contact the patient by telephone for a follow-up status report regarding this interventional procedure.  Comments:  No additional relevant information.  Plan of Care    Imaging Orders     DG C-Arm 1-60 Min-No Report  Procedure Orders     Radiofrequency,Lumbar     Radiofrequency,Lumbar  Medications ordered for procedure: Meds ordered this encounter  Medications  . lidocaine (XYLOCAINE) 2 % (with pres) injection 400 mg  . midazolam (VERSED) 5 MG/5ML injection 1-2 mg    Make sure Flumazenil is available in the pyxis when using this medication. If oversedation occurs, administer 0.2 mg IV over 15 sec. If after 45 sec no response, administer 0.2 mg again over 1 min; may repeat at 1 min intervals; not to exceed 4 doses (1 mg)  . fentaNYL (SUBLIMAZE) injection 25-50 mcg    Make sure Narcan is available in the pyxis when using this medication. In the event of respiratory depression (RR< 8/min): Titrate NARCAN (naloxone) in increments of 0.1 to 0.2 mg IV at 2-3 minute intervals, until desired degree of reversal.  . lactated ringers infusion 1,000 mL  . ropivacaine (PF) 2 mg/mL (0.2%) (NAROPIN) injection 9 mL  . triamcinolone acetonide (KENALOG-40) injection 40 mg   Medications administered: We administered lidocaine, midazolam, fentaNYL, lactated ringers, ropivacaine (PF) 2 mg/mL (0.2%), and triamcinolone acetonide.  See the medical record for exact dosing, route, and time of administration.  New Prescriptions   No medications on file   Disposition: Discharge home  Discharge Date & Time: 04/17/2018; 1555 hrs.   Physician-requested Follow-up: Return for contralateral RFA (2 wks): (R) L-FCT RFA.  Future Appointments  Date Time Provider  [place]  06/06/2018 10:15 AM [facility] None  06/19/2018  2:15 PM Milinda Pointer, MD [facility] None  07/03/2018  8:30 AM Vevelyn Francois, NP [facility] None  08/29/2018 10:15 AM [facility] None  09/04/2018  3:15 PM Hollice Espy, MD [facility] None   Primary Care Physician: Tracie Harrier, MD Location: [facility] Note by: Gaspar Cola, MD Date: 04/17/2018; Time: 4:08 PM  Disclaimer:  Medicine is not an Chief Strategy Officer. The only guarantee in medicine is that nothing is guaranteed. It is important to note that the decision to proceed with this intervention was based on the information collected from the patient. The Data and conclusions were drawn from the patient's questionnaire, the interview, and the physical examination. Because the information was provided in large part by the patient, it cannot be guaranteed that it has not been purposely or unconsciously manipulated. Every effort has been made to obtain as much relevant data as possible for this evaluation. It is important to note that the conclusions that lead to this procedure are derived in large part from the available data. Always take into account that the treatment will also be dependent on availability of resources and existing treatment guidelines, considered by other Pain Management Practitioners as being common knowledge and practice, at the time of the intervention. For Medico-Legal purposes, it is also important to point out that variation in procedural techniques and pharmacological choices are the acceptable norm. The indications, contraindications, technique, and results of the above procedure should only be interpreted and judged by a Board-Certified Interventional Pain Specialist with extensive familiarity and expertise in the same exact procedure and technique.

## 2018-04-18 ENCOUNTER — Telehealth: Payer: Self-pay | Admitting: *Deleted

## 2018-04-18 NOTE — Telephone Encounter (Signed)
Spoke with patient re; procedure on yesterday, verbalizes no complaints. 

## 2018-05-09 ENCOUNTER — Telehealth: Payer: Self-pay | Admitting: Nurse Practitioner

## 2018-05-09 ENCOUNTER — Other Ambulatory Visit: Payer: Self-pay | Admitting: Nurse Practitioner

## 2018-05-09 MED ORDER — GABAPENTIN 300 MG PO CAPS: 300.0000 mg | ORAL_CAPSULE | Freq: Three times a day (TID) | ORAL | 2 refills | Status: DC

## 2018-05-09 NOTE — Telephone Encounter (Signed)
Thank you :)

## 2018-05-09 NOTE — Telephone Encounter (Addendum)
Patient was here in July for med refill, did not get gabapentin. Can this be called in or does he need appt? \ He uses [facility] in [place]

## 2018-05-09 NOTE — Telephone Encounter (Signed)
sent 

## 2018-05-20 ENCOUNTER — Other Ambulatory Visit: Payer: Self-pay | Admitting: Pain Medicine

## 2018-05-20 DIAGNOSIS — M792 Neuralgia and neuritis, unspecified: Secondary | ICD-10-CM

## 2018-06-06 ENCOUNTER — Other Ambulatory Visit: Payer: Medicare Other

## 2018-06-19 ENCOUNTER — Ambulatory Visit (HOSPITAL_BASED_OUTPATIENT_CLINIC_OR_DEPARTMENT_OTHER): Payer: Medicare Other | Admitting: Pain Medicine

## 2018-06-19 ENCOUNTER — Ambulatory Visit
Admission: RE | Admit: 2018-06-19 | Discharge: 2018-06-19 | Disposition: A | Discharge status: Home / Self Care | Payer: Medicare Other | Source: Ambulatory Visit | Attending: Pain Medicine | Admitting: Pain Medicine

## 2018-06-19 ENCOUNTER — Encounter: Payer: Self-pay | Admitting: Pain Medicine

## 2018-06-19 ENCOUNTER — Other Ambulatory Visit: Payer: Self-pay

## 2018-06-19 VITALS — BP 115/77 | HR 80 | Temp 98.6°F | Resp 20 | Ht 68.0 in | Wt 140.0 lb

## 2018-06-19 DIAGNOSIS — M545 Low back pain: Secondary | ICD-10-CM | POA: Diagnosis present

## 2018-06-19 DIAGNOSIS — M47817 Spondylosis without myelopathy or radiculopathy, lumbosacral region: Secondary | ICD-10-CM

## 2018-06-19 DIAGNOSIS — M47897 Other spondylosis, lumbosacral region: Secondary | ICD-10-CM | POA: Diagnosis not present

## 2018-06-19 DIAGNOSIS — M1288 Other specific arthropathies, not elsewhere classified, other specified site: Secondary | ICD-10-CM | POA: Insufficient documentation

## 2018-06-19 DIAGNOSIS — G8918 Other acute postprocedural pain: Secondary | ICD-10-CM

## 2018-06-19 DIAGNOSIS — M47816 Spondylosis without myelopathy or radiculopathy, lumbar region: Secondary | ICD-10-CM | POA: Diagnosis not present

## 2018-06-19 DIAGNOSIS — Z7982 Long term (current) use of aspirin: Secondary | ICD-10-CM | POA: Insufficient documentation

## 2018-06-19 DIAGNOSIS — Z79899 Other long term (current) drug therapy: Secondary | ICD-10-CM | POA: Diagnosis not present

## 2018-06-19 DIAGNOSIS — G8929 Other chronic pain: Secondary | ICD-10-CM

## 2018-06-19 DIAGNOSIS — Z79891 Long term (current) use of opiate analgesic: Secondary | ICD-10-CM | POA: Diagnosis not present

## 2018-06-19 MED ORDER — HYDROCODONE-ACETAMINOPHEN 5-325 MG PO TABS: 1.0000 | ORAL_TABLET | Freq: Three times a day (TID) | ORAL | 0 refills | Status: DC | PRN

## 2018-06-19 MED ORDER — LACTATED RINGERS IV SOLN
1000.0000 mL | Freq: Once | INTRAVENOUS | Status: AC
  Administered 2018-06-19: 1000 mL via INTRAVENOUS

## 2018-06-19 MED ORDER — TRIAMCINOLONE ACETONIDE 40 MG/ML IJ SUSP
40.0000 mg | Freq: Once | INTRAMUSCULAR | Status: AC
  Administered 2018-06-19: 40 mg
  Filled 2018-06-19: qty 1

## 2018-06-19 MED ORDER — MIDAZOLAM HCL 5 MG/5ML IJ SOLN
1.0000 mg | INTRAMUSCULAR | Status: AC | PRN
  Administered 2018-06-19: 4 mg via INTRAVENOUS
  Filled 2018-06-19: qty 5

## 2018-06-19 MED ORDER — LIDOCAINE HCL 2 % IJ SOLN
20.0000 mL | Freq: Once | INTRAMUSCULAR | Status: AC
  Administered 2018-06-19: 400 mg
  Filled 2018-06-19: qty 20

## 2018-06-19 MED ORDER — ROPIVACAINE HCL 2 MG/ML IJ SOLN
9.0000 mL | Freq: Once | INTRAMUSCULAR | Status: AC
  Administered 2018-06-19: 10 mL via PERINEURAL
  Filled 2018-06-19: qty 10

## 2018-06-19 MED ORDER — FENTANYL CITRATE (PF) 100 MCG/2ML IJ SOLN
25.0000 ug | INTRAMUSCULAR | Status: AC | PRN
  Administered 2018-06-19: 50 ug via INTRAVENOUS
  Filled 2018-06-19: qty 2

## 2018-06-19 NOTE — Progress Notes (Signed)
Safety precautions to be maintained throughout the outpatient stay will include: orient to surroundings, keep bed in low position, maintain call bell within reach at all times, provide assistance with transfer out of bed and ambulation.  

## 2018-06-19 NOTE — Patient Instructions (Addendum)
__You have been given 2 prescriptions for hydrocodone, each to last 7 days. _________________________________________________________________________________________  Post-Radiofrequency (RF) Discharge Instructions  You have just completed a Radiofrequency Neurotomy.  The following instructions will provide you with information and guidelines for self-care upon discharge.  If at any time you have questions or concerns please call your physician. DO NOT DRIVE YOURSELF!!  Instructions:  Apply ice: Fill a plastic sandwich bag with crushed ice. Cover it with a small towel and apply to injection site. Apply for 15 minutes then remove x 15 minutes. Repeat sequence on day of procedure, until you go to bed. The purpose is to minimize swelling and discomfort after procedure.  Apply heat: Apply heat to procedure site starting the day following the procedure. The purpose is to treat any soreness and discomfort from the procedure.  Food intake: No eating limitations, unless stipulated above.  Nevertheless, if you have had sedation, you may experience some nausea.  In this case, it may be wise to wait at least two hours prior to resuming regular diet.  Physical activities: Keep activities to a minimum for the first 8 hours after the procedure. For the first 24 hours after the procedure, do not drive a motor vehicle,  Operate heavy machinery, power tools, or handle any weapons.  Consider walking with the use of an assistive device or accompanied by an adult for the first 24 hours.  Do not drink alcoholic beverages including beer.  Do not make any important decisions or sign any legal documents. Go home and rest today.  Resume activities tomorrow, as tolerated.  Use caution in moving about as you may experience mild leg weakness.  Use caution in cooking, use of household electrical appliances and climbing steps.  Driving: If you have received any sedation, you are not allowed to drive for 24 hours after your  procedure.  Blood thinner: Restart your blood thinner 6 hours after your procedure. (Only for those taking blood thinners)  Insulin: As soon as you can eat, you may resume your normal dosing schedule. (Only for those taking insulin)  Medications: May resume pre-procedure medications.  Do not take any drugs, other than what has been prescribed to you.  Infection prevention: Keep procedure site clean and dry.  Post-procedure Pain Diary: Extremely important that this be done correctly and accurately. Recorded information will be used to determine the next step in treatment.  Pain evaluated is that of treated area only. Do not include pain from an untreated area.  Complete every hour, on the hour, for the initial 8 hours. Set an alarm to help you do this part accurately.  Do not go to sleep and have it completed later. It will not be accurate.  Follow-up appointment: Keep your follow-up appointment after the procedure. Usually 2 weeks for most procedures. (6 weeks in the case of radiofrequency.) Bring you pain diary.   Expect:  From numbing medicine (AKA: Local Anesthetics): Numbness or decrease in pain.  Onset: Full effect within 15 minutes of injected.  Duration: It will depend on the type of local anesthetic used. On the average, 1 to 8 hours.   From steroids: Decrease in swelling or inflammation. Once inflammation is improved, relief of the pain will follow.  Onset of benefits: Depends on the amount of swelling present. The more swelling, the longer it will take for the benefits to be seen. In some cases, up to 10 days.  Duration: Steroids will stay in the system x 2 weeks. Duration of benefits  will depend on multiple posibilities including persistent irritating factors.  From procedure: Some discomfort is to be expected once the numbing medicine wears off. This should be minimal if ice and heat are applied as instructed.  Call if:  You experience numbness and weakness that gets  worse with time, as opposed to wearing off.  He experience any unusual bleeding, difficulty breathing, or loss of the ability to control your bowel and bladder. (This applies to Spinal procedures only)  You experience any redness, swelling, heat, red streaks, elevated temperature, fever, or any other signs of a possible infection.  Emergency Numbers:  [place] business hours (Monday - Thursday, 8:00 AM - 4:00 PM) (Friday, 9:00 AM - 12:00 Noon): [phone]  After hours: [phone] ____________________________________________________________________________________________

## 2018-06-19 NOTE — Progress Notes (Signed)
Patient's Name: Mark Hodges  MRN: [id number]  Referring Provider: Tracie Harrier, MD  DOB: 05-05-1956  PCP: Tracie Harrier, MD  DOS: 06/19/2018  Note by: Gaspar Cola, MD  Service setting: Ambulatory outpatient  Specialty: Interventional Pain Management  Patient type: Established  Location: [facility] (AMB) Pain Management Facility  Visit type: Interventional Procedure   Primary Reason for Visit: Interventional Pain Management Treatment. CC: Back Pain (low)  Procedure:          Anesthesia, Analgesia, Anxiolysis:  Type: Thermal Lumbar Facet, Medial Branch Radiofrequency Ablation/Neurotomy  #2  Primary Purpose: Therapeutic Region: Posterolateral Lumbosacral Spine Level: L2, L3, L4, L5, & S1 Medial Branch Level(s). These levels will denervate the L3-4, L4-5, and the L5-S1 lumbar facet joints. Laterality: Right  Type: Moderate (Conscious) Sedation combined with Local Anesthesia Indication(s): Analgesia and Anxiety Route: Intravenous (IV) IV Access: Secured Sedation: Meaningful verbal contact was maintained at all times during the procedure  Local Anesthetic: Lidocaine 1-2%  Position: Prone   Indications: 1. Spondylosis without myelopathy or radiculopathy, lumbosacral region   2. Lumbar facet syndrome (Bilateral) (R>L)   3. Lumbar facet arthropathy   4. Chronic low back pain (Primary Source of Pain) (Bilateral) (R>L)    Mark Hodges has been dealing with the above chronic pain for longer than three months and has either failed to respond, was unable to tolerate, or simply did not get enough benefit from other more conservative therapies including, but not limited to: 1. Over-the-counter medications 2. Anti-inflammatory medications 3. Muscle relaxants 4. Membrane stabilizers 5. Opioids 6. Physical therapy and/or chiropractic manipulation 7. Modalities (Heat, ice, etc.) 8. Invasive techniques such as nerve blocks. Mark Hodges has attained more than 50% relief of the pain from a  series of diagnostic injections conducted in separate occasions.  Pain Score: Pre-procedure: 7 /10 Post-procedure: 0-No pain/10  Pre-op Assessment:  Mark Hodges is a 62 y.o. (year old), male patient, seen today for interventional treatment. He  has a past surgical history that includes Tonsillectomy; Colonoscopy with propofol (N/A, 01/26/2016); and Esophagogastroduodenoscopy (egd) with propofol (N/A, 01/26/2016). Mark Hodges has a current medication list which includes the following prescription(s): alprostadil, aspirin, atorvastatin, cyanocobalamin, diazepam, duloxetine, gabapentin, lamotrigine, nortriptyline, oxycodone hcl, sertraline, tamsulosin, venlafaxine hcl, hydrocodone-acetaminophen, hydrocodone-acetaminophen, oxycodone hcl, and oxycodone hcl. His primarily concern today is the Back Pain (low)  Initial Vital Signs:  Pulse/HCG Rate: 97ECG Heart Rate: 80 Temp: 99.2 F (37.3 C) Resp: 18 BP: (!) 124/91 SpO2: 100 %  BMI: Estimated body mass index is 21.29 kg/m as calculated from the following:   Height as of this encounter: 5\' 8"  (1.727 m).   Weight as of this encounter: 140 lb (63.5 kg).  Risk Assessment: Allergies: Reviewed. He has No Known Allergies.  Allergy Precautions: None required Coagulopathies: Reviewed. None identified.  Blood-thinner therapy: None at this time Active Infection(s): Reviewed. None identified. Mark Hodges is afebrile  Site Confirmation: Mark Hodges was asked to confirm the procedure and laterality before marking the site Procedure checklist: Completed Consent: Before the procedure and under the influence of no sedative(s), amnesic(s), or anxiolytics, the patient was informed of the treatment options, risks and possible complications. To fulfill our ethical and legal obligations, as recommended by the American Medical Association's Code of Ethics, I have informed the patient of my clinical impression; the nature and purpose of the treatment or procedure; the risks,  benefits, and possible complications of the intervention; the alternatives, including doing nothing; the risk(s) and benefit(s) of the alternative treatment(s) or procedure(s);  and the risk(s) and benefit(s) of doing nothing. The patient was provided information about the general risks and possible complications associated with the procedure. These may include, but are not limited to: failure to achieve desired goals, infection, bleeding, organ or nerve damage, allergic reactions, paralysis, and death. In addition, the patient was informed of those risks and complications associated to Spine-related procedures, such as failure to decrease pain; infection (i.e.: Meningitis, epidural or intraspinal abscess); bleeding (i.e.: epidural hematoma, subarachnoid hemorrhage, or any other type of intraspinal or peri-dural bleeding); organ or nerve damage (i.e.: Any type of peripheral nerve, nerve root, or spinal cord injury) with subsequent damage to sensory, motor, and/or autonomic systems, resulting in permanent pain, numbness, and/or weakness of one or several areas of the body; allergic reactions; (i.e.: anaphylactic reaction); and/or death. Furthermore, the patient was informed of those risks and complications associated with the medications. These include, but are not limited to: allergic reactions (i.e.: anaphylactic or anaphylactoid reaction(s)); adrenal axis suppression; blood sugar elevation that in diabetics may result in ketoacidosis or comma; water retention that in patients with history of congestive heart failure may result in shortness of breath, pulmonary edema, and decompensation with resultant heart failure; weight gain; swelling or edema; medication-induced neural toxicity; particulate matter embolism and blood vessel occlusion with resultant organ, and/or nervous system infarction; and/or aseptic necrosis of one or more joints. Finally, the patient was informed that Medicine is not an exact science;  therefore, there is also the possibility of unforeseen or unpredictable risks and/or possible complications that may result in a catastrophic outcome. The patient indicated having understood very clearly. We have given the patient no guarantees and we have made no promises. Enough time was given to the patient to ask questions, all of which were answered to the patient's satisfaction. Mark Hodges has indicated that he wanted to continue with the procedure. Attestation: I, the ordering provider, attest that I have discussed with the patient the benefits, risks, side-effects, alternatives, likelihood of achieving goals, and potential problems during recovery for the procedure that I have provided informed consent. Date  Time: 06/19/2018  2:03 PM  Pre-Procedure Preparation:  Monitoring: As per clinic protocol. Respiration, ETCO2, SpO2, BP, heart rate and rhythm monitor placed and checked for adequate function Safety Precautions: Patient was assessed for positional comfort and pressure points before starting the procedure. Time-out: I initiated and conducted the "Time-out" before starting the procedure, as per protocol. The patient was asked to participate by confirming the accuracy of the "Time Out" information. Verification of the correct person, site, and procedure were performed and confirmed by me, the nursing staff, and the patient. "Time-out" conducted as per Joint Commission's Universal Protocol (UP.01.01.01). Time: 1500  Description of Procedure:          Laterality: Right Levels:  L2, L3, L4, L5, & S1 Medial Branch Level(s), at the L3-4, L4-5, and the L5-S1 lumbar facet joints. Area Prepped: Lumbosacral Prepping solution: ChloraPrep (2% chlorhexidine gluconate and 70% isopropyl alcohol) Safety Precautions: Aspiration looking for blood return was conducted prior to all injections. At no point did we inject any substances, as a needle was being advanced. Before injecting, the patient was told to  immediately notify me if he was experiencing any new onset of "ringing in the ears, or metallic taste in the mouth". No attempts were made at seeking any paresthesias. Safe injection practices and needle disposal techniques used. Medications properly checked for expiration dates. SDV (single dose vial) medications used. After the completion of  the procedure, all disposable equipment used was discarded in the proper designated medical waste containers. Local Anesthesia: Protocol guidelines were followed. The patient was positioned over the fluoroscopy table. The area was prepped in the usual manner. The time-out was completed. The target area was identified using fluoroscopy. A 12-in long, straight, sterile hemostat was used with fluoroscopic guidance to locate the targets for each level blocked. Once located, the skin was marked with an approved surgical skin marker. Once all sites were marked, the skin (epidermis, dermis, and hypodermis), as well as deeper tissues (fat, connective tissue and muscle) were infiltrated with a small amount of a short-acting local anesthetic, loaded on a 10cc syringe with a 25G, 1.5-in  Needle. An appropriate amount of time was allowed for local anesthetics to take effect before proceeding to the next step. Local Anesthetic: Lidocaine 2.0% The unused portion of the local anesthetic was discarded in the proper designated containers. Technical explanation of process:  Radiofrequency Ablation (RFA) L2 Medial Branch Nerve RFA: The target area for the L2 medial branch is at the junction of the postero-lateral aspect of the superior articular process and the superior, posterior, and medial edge of the transverse process of L3. Under fluoroscopic guidance, a Radiofrequency needle was inserted until contact was made with os over the superior postero-lateral aspect of the pedicular shadow (target area). Sensory and motor testing was conducted to properly adjust the position of the needle.  Once satisfactory placement of the needle was achieved, the numbing solution was slowly injected after negative aspiration for blood. 2.0 mL of the nerve block solution was injected without difficulty or complication. After waiting for at least 3 minutes, the ablation was performed. Once completed, the needle was removed intact. L3 Medial Branch Nerve RFA: The target area for the L3 medial branch is at the junction of the postero-lateral aspect of the superior articular process and the superior, posterior, and medial edge of the transverse process of L4. Under fluoroscopic guidance, a Radiofrequency needle was inserted until contact was made with os over the superior postero-lateral aspect of the pedicular shadow (target area). Sensory and motor testing was conducted to properly adjust the position of the needle. Once satisfactory placement of the needle was achieved, the numbing solution was slowly injected after negative aspiration for blood. 2.0 mL of the nerve block solution was injected without difficulty or complication. After waiting for at least 3 minutes, the ablation was performed. Once completed, the needle was removed intact. L4 Medial Branch Nerve RFA: The target area for the L4 medial branch is at the junction of the postero-lateral aspect of the superior articular process and the superior, posterior, and medial edge of the transverse process of L5. Under fluoroscopic guidance, a Radiofrequency needle was inserted until contact was made with os over the superior postero-lateral aspect of the pedicular shadow (target area). Sensory and motor testing was conducted to properly adjust the position of the needle. Once satisfactory placement of the needle was achieved, the numbing solution was slowly injected after negative aspiration for blood. 2.0 mL of the nerve block solution was injected without difficulty or complication. After waiting for at least 3 minutes, the ablation was performed. Once  completed, the needle was removed intact. L5 Medial Branch Nerve RFA: The target area for the L5 medial branch is at the junction of the postero-lateral aspect of the superior articular process of S1 and the superior, posterior, and medial edge of the sacral ala. Under fluoroscopic guidance, a Radiofrequency needle was inserted  until contact was made with os over the superior postero-lateral aspect of the pedicular shadow (target area). Sensory and motor testing was conducted to properly adjust the position of the needle. Once satisfactory placement of the needle was achieved, the numbing solution was slowly injected after negative aspiration for blood. 2.0 mL of the nerve block solution was injected without difficulty or complication. After waiting for at least 3 minutes, the ablation was performed. Once completed, the needle was removed intact. S1 Medial Branch Nerve RFA: The target area for the S1 medial branch is located inferior to the junction of the S1 superior articular process and the L5 inferior articular process, posterior, inferior, and lateral to the 6 o'clock position of the L5-S1 facet joint, just superior to the S1 posterior foramen. Under fluoroscopic guidance, the Radiofrequency needle was advanced until contact was made with os over the Target area. Sensory and motor testing was conducted to properly adjust the position of the needle. Once satisfactory placement of the needle was achieved, the numbing solution was slowly injected after negative aspiration for blood. 2.0 mL of the nerve block solution was injected without difficulty or complication. After waiting for at least 3 minutes, the ablation was performed. Once completed, the needle was removed intact. Radiofrequency lesioning (ablation):  Radiofrequency Generator: NeuroTherm [id number] Sensory Stimulation Parameters: 50 Hz was used to locate & identify the nerve, making sure that the needle was positioned such that there was no sensory  stimulation below 0.3 V or above 0.7 V. Motor Stimulation Parameters: 2 Hz was used to evaluate the motor component. Care was taken not to lesion any nerves that demonstrated motor stimulation of the lower extremities at an output of less than 2.5 times that of the sensory threshold, or a maximum of 2.0 V. Lesioning Technique Parameters: Standard Radiofrequency settings. (Not bipolar or pulsed.) Temperature Settings: 80 degrees C Lesioning time: 60 seconds Intra-operative Compliance: Compliant Materials & Medications: Needle(s) (Electrode/Cannula) Type: Teflon-coated, curved tip, Radiofrequency needle(s) Gauge: 22G Length: 10cm Numbing solution: 0.2% PF-Ropivacaine + Triamcinolone (40 mg/mL) diluted to a final concentration of 4 mg of Triamcinolone/mL of Ropivacaine The unused portion of the solution was discarded in the proper designated containers.  Once the entire procedure was completed, the treated area was cleaned, making sure to leave some of the prepping solution back to take advantage of its long term bactericidal properties.  Illustration of the posterior view of the lumbar spine and the posterior neural structures. Laminae of L2 through S1 are labeled. DPRL5, dorsal primary ramus of L5; DPRS1, dorsal primary ramus of S1; DPR3, dorsal primary ramus of L3; FJ, facet (zygapophyseal) joint L3-L4; I, inferior articular process of L4; LB1, lateral branch of dorsal primary ramus of L1; IAB, inferior articular branches from L3 medial branch (supplies L4-L5 facet joint); IBP, intermediate branch plexus; MB3, medial branch of dorsal primary ramus of L3; NR3, third lumbar nerve root; S, superior articular process of L5; SAB, superior articular branches from L4 (supplies L4-5 facet joint also); TP3, transverse process of L3.  Vitals:   06/19/18 1540 06/19/18 1546 06/19/18 1556 06/19/18 1606  BP: 128/87 121/81 112/78 115/77  Pulse:  80 80   Resp: 19 20  20   Temp:  98.6 F (37 C)    TempSrc:   Tympanic    SpO2: 98% 100% 98% 100%  Weight:      Height:        Start Time: 1500 hrs. End Time: 1535 hrs.  Imaging Guidance (Spinal):  Type of Imaging Technique: Fluoroscopy Guidance (Spinal) Indication(s): Assistance in needle guidance and placement for procedures requiring needle placement in or near specific anatomical locations not easily accessible without such assistance. Exposure Time: Please see nurses notes. Contrast: None used. Fluoroscopic Guidance: I was personally present during the use of fluoroscopy. "Tunnel Vision Technique" used to obtain the best possible view of the target area. Parallax error corrected before commencing the procedure. "Direction-depth-direction" technique used to introduce the needle under continuous pulsed fluoroscopy. Once target was reached, antero-posterior, oblique, and lateral fluoroscopic projection used confirm needle placement in all planes. Images permanently stored in EMR. Interpretation: No contrast injected. I personally interpreted the imaging intraoperatively. Adequate needle placement confirmed in multiple planes. Permanent images saved into the patient's record.  Antibiotic Prophylaxis:   Anti-infectives (From admission, onward)   None     Indication(s): None identified  Post-operative Assessment:  Post-procedure Vital Signs:  Pulse/HCG Rate: 8079 Temp: 98.6 F (37 C) Resp: 20 BP: 115/77 SpO2: 100 %  EBL: None  Complications: No immediate post-treatment complications observed by team, or reported by patient.  Note: The patient tolerated the entire procedure well. A repeat set of vitals were taken after the procedure and the patient was kept under observation following institutional policy, for this type of procedure. Post-procedural neurological assessment was performed, showing return to baseline, prior to discharge. The patient was provided with post-procedure discharge instructions, including a section on how to  identify potential problems. Should any problems arise concerning this procedure, the patient was given instructions to immediately contact us, at any time, without hesitation. In any case, we plan to contact the patient by telephone for a follow-up status report regarding this interventional procedure.  Comments:  No additional relevant information.  Plan of Care    Imaging Orders     DG C-Arm 1-60 Min-No Report  Procedure Orders     Radiofrequency,Lumbar  Medications ordered for procedure: Meds ordered this encounter  Medications  . lidocaine (XYLOCAINE) 2 % (with pres) injection 400 mg  . midazolam (VERSED) 5 MG/5ML injection 1-2 mg    Make sure Flumazenil is available in the pyxis when using this medication. If oversedation occurs, administer 0.2 mg IV over 15 sec. If after 45 sec no response, administer 0.2 mg again over 1 min; may repeat at 1 min intervals; not to exceed 4 doses (1 mg)  . fentaNYL (SUBLIMAZE) injection 25-50 mcg    Make sure Narcan is available in the pyxis when using this medication. In the event of respiratory depression (RR< 8/min): Titrate NARCAN (naloxone) in increments of 0.1 to 0.2 mg IV at 2-3 minute intervals, until desired degree of reversal.  . lactated ringers infusion 1,000 mL  . ropivacaine (PF) 2 mg/mL (0.2%) (NAROPIN) injection 9 mL  . triamcinolone acetonide (KENALOG-40) injection 40 mg  . HYDROcodone-acetaminophen (NORCO/VICODIN) 5-325 MG tablet    Sig: Take 1 tablet by mouth every 8 (eight) hours as needed for up to 7 days for severe pain.    Dispense:  21 tablet    Refill:  0    For acute post-operative pain. Not to be refilled. To last 7 days.  Marland Kitchen HYDROcodone-acetaminophen (NORCO/VICODIN) 5-325 MG tablet    Sig: Take 1 tablet by mouth every 8 (eight) hours as needed for up to 7 days for severe pain.    Dispense:  21 tablet    Refill:  0    For acute post-operative pain. Not to be refilled. To last 7 days.  Medications administered: We  administered lidocaine, midazolam, fentaNYL, lactated ringers, ropivacaine (PF) 2 mg/mL (0.2%), and triamcinolone acetonide.  See the medical record for exact dosing, route, and time of administration.  New Prescriptions   HYDROCODONE-ACETAMINOPHEN (NORCO/VICODIN) 5-325 MG TABLET    Take 1 tablet by mouth every 8 (eight) hours as needed for up to 7 days for severe pain.   HYDROCODONE-ACETAMINOPHEN (NORCO/VICODIN) 5-325 MG TABLET    Take 1 tablet by mouth every 8 (eight) hours as needed for up to 7 days for severe pain.   Disposition: Discharge home  Discharge Date & Time: 06/19/2018; 1610 hrs.   Physician-requested Follow-up: Return for Post-RFA eval w/ Dionisio David, NP, before 07/15/18.  Future Appointments  Date Time Provider [place]  07/11/2018 11:00 AM Vevelyn Francois, NP [facility] None  08/29/2018 10:15 AM [facility] None  09/04/2018  3:15 PM Hollice Espy, MD [facility] None   Primary Care Physician: Tracie Harrier, MD Location: [facility] Note by: Gaspar Cola, MD Date: 06/19/2018; Time: 4:02 PM  Disclaimer:  Medicine is not an Chief Strategy Officer. The only guarantee in medicine is that nothing is guaranteed. It is important to note that the decision to proceed with this intervention was based on the information collected from the patient. The Data and conclusions were drawn from the patient's questionnaire, the interview, and the physical examination. Because the information was provided in large part by the patient, it cannot be guaranteed that it has not been purposely or unconsciously manipulated. Every effort has been made to obtain as much relevant data as possible for this evaluation. It is important to note that the conclusions that lead to this procedure are derived in large part from the available data. Always take into account that the treatment will also be dependent on availability of resources and existing treatment guidelines,  considered by other Pain Management Practitioners as being common knowledge and practice, at the time of the intervention. For Medico-Legal purposes, it is also important to point out that variation in procedural techniques and pharmacological choices are the acceptable norm. The indications, contraindications, technique, and results of the above procedure should only be interpreted and judged by a Board-Certified Interventional Pain Specialist with extensive familiarity and expertise in the same exact procedure and technique.

## 2018-06-20 ENCOUNTER — Telehealth: Payer: Self-pay | Admitting: *Deleted

## 2018-06-20 NOTE — Telephone Encounter (Signed)
Attempted to reach patient, no answer, no voicemail

## 2018-07-03 ENCOUNTER — Encounter: Payer: Medicare Other | Admitting: Nurse Practitioner

## 2018-07-11 ENCOUNTER — Ambulatory Visit: Payer: Medicare Other | Admitting: Nurse Practitioner

## 2018-07-17 ENCOUNTER — Ambulatory Visit: Payer: Medicare Other | Admitting: Nurse Practitioner

## 2018-07-17 ENCOUNTER — Telehealth: Payer: Self-pay | Admitting: Urology

## 2018-07-17 NOTE — Telephone Encounter (Signed)
Pt is moving away from the area and wanted to tell you goodbye and thank you for all you've done for him.

## 2018-07-19 ENCOUNTER — Other Ambulatory Visit: Payer: Self-pay

## 2018-07-19 ENCOUNTER — Ambulatory Visit: Payer: Medicare Other | Attending: Nurse Practitioner | Admitting: Nurse Practitioner

## 2018-07-19 ENCOUNTER — Encounter: Payer: Self-pay | Admitting: Nurse Practitioner

## 2018-07-19 VITALS — BP 127/95 | HR 104 | Temp 98.4°F | Ht 68.0 in | Wt 140.0 lb

## 2018-07-19 DIAGNOSIS — M1611 Unilateral primary osteoarthritis, right hip: Secondary | ICD-10-CM | POA: Diagnosis not present

## 2018-07-19 DIAGNOSIS — Z8249 Family history of ischemic heart disease and other diseases of the circulatory system: Secondary | ICD-10-CM | POA: Diagnosis not present

## 2018-07-19 DIAGNOSIS — M47816 Spondylosis without myelopathy or radiculopathy, lumbar region: Secondary | ICD-10-CM | POA: Diagnosis not present

## 2018-07-19 DIAGNOSIS — R51 Headache: Secondary | ICD-10-CM | POA: Diagnosis not present

## 2018-07-19 DIAGNOSIS — M549 Dorsalgia, unspecified: Secondary | ICD-10-CM | POA: Insufficient documentation

## 2018-07-19 DIAGNOSIS — G629 Polyneuropathy, unspecified: Secondary | ICD-10-CM | POA: Insufficient documentation

## 2018-07-19 DIAGNOSIS — Z79891 Long term (current) use of opiate analgesic: Secondary | ICD-10-CM | POA: Insufficient documentation

## 2018-07-19 DIAGNOSIS — M47892 Other spondylosis, cervical region: Secondary | ICD-10-CM | POA: Diagnosis not present

## 2018-07-19 DIAGNOSIS — M79604 Pain in right leg: Secondary | ICD-10-CM | POA: Diagnosis not present

## 2018-07-19 DIAGNOSIS — M4726 Other spondylosis with radiculopathy, lumbar region: Secondary | ICD-10-CM | POA: Diagnosis not present

## 2018-07-19 DIAGNOSIS — N400 Enlarged prostate without lower urinary tract symptoms: Secondary | ICD-10-CM | POA: Insufficient documentation

## 2018-07-19 DIAGNOSIS — F329 Major depressive disorder, single episode, unspecified: Secondary | ICD-10-CM | POA: Diagnosis not present

## 2018-07-19 DIAGNOSIS — M47897 Other spondylosis, lumbosacral region: Secondary | ICD-10-CM | POA: Diagnosis not present

## 2018-07-19 DIAGNOSIS — J449 Chronic obstructive pulmonary disease, unspecified: Secondary | ICD-10-CM | POA: Diagnosis not present

## 2018-07-19 DIAGNOSIS — G894 Chronic pain syndrome: Secondary | ICD-10-CM | POA: Insufficient documentation

## 2018-07-19 DIAGNOSIS — F17200 Nicotine dependence, unspecified, uncomplicated: Secondary | ICD-10-CM | POA: Insufficient documentation

## 2018-07-19 DIAGNOSIS — Z8673 Personal history of transient ischemic attack (TIA), and cerebral infarction without residual deficits: Secondary | ICD-10-CM | POA: Diagnosis not present

## 2018-07-19 DIAGNOSIS — G950 Syringomyelia and syringobulbia: Secondary | ICD-10-CM | POA: Diagnosis not present

## 2018-07-19 DIAGNOSIS — M48061 Spinal stenosis, lumbar region without neurogenic claudication: Secondary | ICD-10-CM | POA: Insufficient documentation

## 2018-07-19 DIAGNOSIS — F411 Generalized anxiety disorder: Secondary | ICD-10-CM | POA: Insufficient documentation

## 2018-07-19 DIAGNOSIS — Z5181 Encounter for therapeutic drug level monitoring: Secondary | ICD-10-CM | POA: Diagnosis not present

## 2018-07-19 DIAGNOSIS — E7800 Pure hypercholesterolemia, unspecified: Secondary | ICD-10-CM | POA: Diagnosis not present

## 2018-07-19 DIAGNOSIS — Z7982 Long term (current) use of aspirin: Secondary | ICD-10-CM | POA: Insufficient documentation

## 2018-07-19 DIAGNOSIS — Z79899 Other long term (current) drug therapy: Secondary | ICD-10-CM | POA: Insufficient documentation

## 2018-07-19 DIAGNOSIS — N529 Male erectile dysfunction, unspecified: Secondary | ICD-10-CM | POA: Insufficient documentation

## 2018-07-19 MED ORDER — OXYCODONE HCL 10 MG PO TABS: 10.0000 mg | ORAL_TABLET | Freq: Three times a day (TID) | ORAL | 0 refills | Status: DC | PRN

## 2018-07-19 MED ORDER — GABAPENTIN 300 MG PO CAPS: 300.0000 mg | ORAL_CAPSULE | Freq: Three times a day (TID) | ORAL | 2 refills | Status: AC

## 2018-07-19 MED ORDER — OXYCODONE HCL 10 MG PO TABS: 10.0000 mg | ORAL_TABLET | Freq: Three times a day (TID) | ORAL | 0 refills | Status: AC | PRN

## 2018-07-19 NOTE — Progress Notes (Signed)
Patient's Name: Mark Hodges  MRN: [id number]  Referring Provider: Tracie Harrier, MD  DOB: 11-09-1955  PCP: Tracie Harrier, MD  DOS: 07/19/2018  Note by: Vevelyn Francois NP  Service setting: Ambulatory outpatient  Specialty: Interventional Pain Management  Location: [facility] (AMB) Pain Management Facility    Patient type: Established    Primary Reason(s) for Visit: Encounter for prescription drug management & post-procedure evaluation of chronic illness with mild to moderate exacerbation(Level of risk: moderate) CC: Back Pain  HPI  Mark Hodges is a 62 y.o. year old, male patient, who comes today for a post-procedure evaluation and medication management. He has Difficulty in walking; Compulsive tobacco user syndrome; Hypercholesterolemia without hypertriglyceridemia; Long term current use of opiate analgesic; Long term prescription opiate use; Opiate use (15 MME/Day); Opiate dependence ([place]); Encounter for therapeutic drug level monitoring; Chronic low back pain (Primary Source of Pain) (Bilateral) (R>L); Chronic lumbar radicular pain (Polyradiculopathy) (Right); Ataxia; Hyperlipidemia; Current tobacco use; Pure hypercholesterolemia; Arm numbness; Substance use disorder Risk: LOW; Chronic back pain; Chronic neck pain (Tertiary Area of Pain) (Bilateral) (R>L); Cervicogenic headache; Chronic radicular cervical pain; Chronic hip pain (Secondary source of pain) (Right); Generalized anxiety disorder; At high risk for falls; Erectile dysfunction; History of TIA (transient ischemic attack); 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 without associated mass lesion or cord expansion.; Abnormal MRI, lumbar spine (06/26/2015); Lumbar spondylosis; 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, by EMG/PNCV; Neuropathic pain; Chronic lower extremity pain (Right); History of prostate cancer; Lumbar facet arthropathy; 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); Mild chronic obstructive pulmonary disease ([place]);  Osteoarthritis of hip (Right); Abnormal MRI, thoracic spine (07/23/2015); Osteoarthritis of hip (Right); Lumbar facet syndrome (Bilateral) (R>L); Clinical depression; Acute postoperative pain; Chronic pain syndrome; T12-L1 disc extrusion; Cervico-occipital neuralgia (Bilateral); Whiplash injury syndrome, initial encounter; Disturbance of skin sensation; Cervical facet syndrome (HCC) (Bilateral) (R>L); Mood disorder ([place]); Cervical spondylosis; Spondylosis without myelopathy or radiculopathy, cervical region; Cervicalgia; Depression; and Spondylosis without myelopathy or radiculopathy, lumbosacral region on their problem list. His primarily concern today is the Back Pain  Pain Assessment: Location: Lower Back Radiating: denies Onset: More than a month ago Duration: Chronic pain Quality: Stabbing, Nagging, Constant Severity: 6 /10 (subjective, self-reported pain score)  Note: Reported level is compatible with observation. Clinically the patient looks like a 0/10       Mark Hodges does not seem to understand the use of our objective pain scale When using our objective Pain Scale, levels between 6 and 10/10 are said to belong in an emergency room, as it progressively worsens from a 6/10, described as severely limiting, requiring emergency care not usually available at an outpatient pain management facility. At a 6/10 level, communication becomes difficult and requires great effort. Assistance to reach the emergency department may be required. Facial flushing and profuse sweating along with potentially dangerous increases in heart rate and blood pressure will be evident. Effect on ADL: limits my daily activities Timing: Constant Modifying factors: medication, heat BP: (!) 127/95  HR: (!) 104  Mark Hodges was last seen on 06/19/2018 for a procedure. During today's appointment we reviewed Mark Hodges's post-procedure results, as well as his outpatient medication regimen.  Further details on both, my  assessment(s), as well as the proposed treatment plan, please see below.  Controlled Substance Pharmacotherapy Assessment REMS (Risk Evaluation and Mitigation Strategy)  Analgesic:Oxycodone IR 10 mg 1 tablet p.o. every 8 hours (30 mg/day of oxycodone) (45 MME/day) MME/day:3m/day.  BChauncey Fischer RN  07/19/2018  9:49 AM  Sign at close encounter Nursing Pain Medication Assessment:  Safety precautions to be maintained throughout the outpatient stay will include: orient to surroundings, keep bed in low position, maintain call bell within reach at all times, provide assistance with transfer out of bed and ambulation.  Medication Inspection Compliance: Pill count conducted under aseptic conditions, in front of the patient. Neither the pills nor the bottle was removed from the patient's sight at any time. Once count was completed pills were immediately returned to the patient in their original bottle.  Medication: Oxycodone IR Pill/Patch Count: 6 of 90 pills remain Pill/Patch Appearance: Markings consistent with prescribed medication Bottle Appearance: Standard pharmacy container. Clearly labeled. Filled Date: 34 / 22 / 2019 Last Medication intake:  Yesterday   Pharmacokinetics: Liberation and absorption (onset of action): WNL Distribution (time to peak effect): WNL Metabolism and excretion (duration of action): WNL         Pharmacodynamics: Desired effects: Analgesia: Mr. Lancon reports >50% benefit. Functional ability: Patient reports that medication allows him to accomplish basic ADLs Clinically meaningful improvement in function (CMIF): Sustained CMIF goals met Perceived effectiveness: Described as relatively effective, allowing for increase in activities of daily living (ADL) Undesirable effects: Side-effects or Adverse reactions: None reported Monitoring: [place] PMP: Online review of the past 34-monthperiod conducted. Compliant with practice rules and regulations Last UDS on  record: Summary  Date Value Ref Range Status  04/02/2018 FINAL  Final    Comment:    ==================================================================== TOXASSURE SELECT 13 (MW) ==================================================================== Test                             Result       Flag       Units Drug Absent but Declared for Prescription Verification   Diazepam                       Not Detected UNEXPECTED ng/mg creat   Oxycodone                      Not Detected UNEXPECTED ng/mg creat ==================================================================== Test                      Result    Flag   Units      Ref Range   Creatinine              71               mg/dL      >=20 ==================================================================== Declared Medications:  The flagging and interpretation on this report are based on the  following declared medications.  Unexpected results may arise from  inaccuracies in the declared medications.  **Note: The testing scope of this panel includes these medications:  Diazepam (Valium)  Oxycodone  **Note: The testing scope of this panel does not include following  reported medications:  Aspirin  Atorvastatin (Lipitor)  Duloxetine (Cymbalta)  Gabapentin  Lamotrigine  Nortriptyline (Pamelor)  Sertraline (Zoloft)  Tamsulosin (Flomax)  Venlafaxine  Vitamin B12 ==================================================================== For clinical consultation, please call ([phone] ====================================================================    UDS interpretation: Non-Compliant The patient was given a final warning about the accuracy of reporting medications. Medication Assessment Form: Reviewed. Abnormalities discussed Treatment compliance: Compliant Risk Assessment Profile: Aberrant behavior: See prior evaluations. None observed or detected today Comorbid factors increasing risk of overdose: See prior notes. No  additional risks detected  today Opioid risk tool (ORT) (Total Score): 1 Personal History of Substance Abuse (SUD-Substance use disorder):  Alcohol: Negative  Illegal Drugs: Negative  Rx Drugs: Negative  ORT Risk Level calculation: Low Risk Risk of substance use disorder (SUD): Moderate Opioid Risk Tool - 07/19/18 0955      Family History of Substance Abuse   Alcohol  Negative    Illegal Drugs  Negative    Rx Drugs  Negative      Personal History of Substance Abuse   Alcohol  Negative    Illegal Drugs  Negative    Rx Drugs  Negative      Age   Age between 7-45 years   No      History of Preadolescent Sexual Abuse   History of Preadolescent Sexual Abuse  Negative or Male      Psychological Disease   Psychological Disease  Negative    Depression  Positive      Total Score   Opioid Risk Tool Scoring  1    Opioid Risk Interpretation  Low Risk      ORT Scoring interpretation table:  Score <3 = Low Risk for SUD  Score between 4-7 = Moderate Risk for SUD  Score >8 = High Risk for Opioid Abuse   Risk Mitigation Strategies:  Patient Counseling: Covered Patient-Prescriber Agreement (PPA): Present and active  Notification to other healthcare providers: Done  Pharmacologic Plan: No change in therapy, at this time.           Informed patient if UDS was not appropriate that his plan would be adjusted. Post-Procedure Assessment  04/17/18 & 06/19/2018 Procedure: Left and right lumbar facet radiofrequency ablation Pre-procedure pain score:  5/10 & 7/10 Post-procedure pain score: 0/10         Influential Factors: BMI: 21.29 kg/m Intra-procedural challenges: None observed.         Assessment challenges: None detected.              Reported side-effects: None.        Post-procedural adverse reactions or complications: None reported         Sedation: Please see nurses note. When no sedatives are used, the analgesic levels obtained are directly associated to the effectiveness of the  local anesthetics. However, when sedation is provided, the level of analgesia obtained during the initial 1 hour following the intervention, is believed to be the result of a combination of factors. These factors may include, but are not limited to: 1. The effectiveness of the local anesthetics used. 2. The effects of the analgesic(s) and/or anxiolytic(s) used. 3. The degree of discomfort experienced by the patient at the time of the procedure. 4. The patients ability and reliability in recalling and recording the events. 5. The presence and influence of possible secondary gains and/or psychosocial factors. Reported result: Relief experienced during the 1st hour after the procedure: 100 % (Ultra-Short Term Relief)            Interpretative annotation: Clinically appropriate result. Analgesia during this period is likely to be Local Anesthetic and/or IV Sedative (Analgesic/Anxiolytic) related.          Effects of local anesthetic: The analgesic effects attained during this period are directly associated to the localized infiltration of local anesthetics and therefore cary significant diagnostic value as to the etiological location, or anatomical origin, of the pain. Expected duration of relief is directly dependent on the pharmacodynamics of the local anesthetic used. Long-acting (4-6 hours) anesthetics used.  Reported result: Relief during the next 4 to 6 hour after the procedure: 100 %(100 for 6 days) (Short-Term Relief)            Interpretative annotation: Clinically appropriate result. Analgesia during this period is likely to be Local Anesthetic-related.          Long-term benefit: Defined as the period of time past the expected duration of local anesthetics (1 hour for short-acting and 4-6 hours for long-acting). With the possible exception of prolonged sympathetic blockade from the local anesthetics, benefits during this period are typically attributed to, or associated with, other factors such  as analgesic sensory neuropraxia, antiinflammatory effects, or beneficial biochemical changes provided by agents other than the local anesthetics.  Reported result: Extended relief following procedure: 70 % (Long-Term Relief)            Interpretative annotation: Clinically possible results. Good relief. No permanent benefit expected. Inflammation plays a part in the etiology to the pain.          Current benefits: Defined as reported results that persistent at this point in time.   Analgesia: 50-75 70%            Function: Back to baseline for 2 weeks after the procedure ROM: Back to baseline Interpretative annotation: Recurrence of symptoms. Limited therapeutic benefit. Results would argue against repeating therapy.          Interpretation: Results would suggest therapy to have a limited impact on the patient's condition. for therapeutic reasons          Plan:  Please see "Plan of Care" for details.                Laboratory Chemistry  Inflammation Markers (CRP: Acute Phase) (ESR: Chronic Phase) Lab Results  Component Value Date   CRP 1.5 04/19/2017   ESRSEDRATE 2 04/19/2017                         Rheumatology Markers No results found for: RF, ANA, LABURIC, URICUR, LYMEIGGIGMAB, LYMEABIGMQN, HLAB27                      Renal Function Markers Lab Results  Component Value Date   BUN 9 04/19/2017   CREATININE 0.82 04/19/2017   BCR 11 04/19/2017   GFRAA 113 04/19/2017   GFRNONAA 97 04/19/2017                             Hepatic Function Markers Lab Results  Component Value Date   AST 20 04/19/2017   ALT 18 04/19/2017   ALBUMIN 5.1 04/19/2017   ALKPHOS 109 04/19/2017                        Electrolytes Lab Results  Component Value Date   NA 140 04/19/2017   K 4.7 04/19/2017   CL 101 04/19/2017   CALCIUM 10.5 (H) 04/19/2017   MG 2.3 04/19/2017                        Neuropathy Markers Lab Results  Component Value Date   VITAMINB12 1,147 04/19/2017                         CNS Tests No results found for: COLORCSF, APPEARCSF, RBCCOUNTCSF, WBCCSF, POLYSCSF, LYMPHSCSF, EOSCSF, PROTEINCSF, GLUCCSF, JCVIRUS,  CSFOLI, IGGCSF                      Bone Pathology Markers Lab Results  Component Value Date   25OHVITD1 55 04/19/2017   25OHVITD2 <1.0 04/19/2017   25OHVITD3 54 04/19/2017                         Coagulation Parameters No results found for: INR, LABPROT, APTT, PLT, DDIMER                      Cardiovascular Markers No results found for: BNP, CKTOTAL, CKMB, TROPONINI, HGB, HCT                       CA Markers No results found for: CEA, CA125, LABCA2                      Note: Lab results reviewed.  Recent Diagnostic Imaging Results  DG C-Arm 1-60 Min-No Report Fluoroscopy was utilized by the requesting physician.  No radiographic  interpretation.   Complexity Note: Imaging results reviewed. Results shared with Mark Hodges, using Layman's terms.                         Meds   Current Outpatient Medications:  .  alprostadil (CAVERJECT IMPULSE) 10 MCG injection, 10 mcg by Intracavitary route as needed for erectile dysfunction. use no more than 3 times per week (Patient taking differently: 10 mcg by Intracavitary route as needed for erectile dysfunction. use no more than 3 times per week), Disp: 3 each, Rfl: 3 .  aspirin 325 MG tablet, Take 325 mg by mouth daily. , Disp: , Rfl:  .  cyanocobalamin (,VITAMIN B-12,) 1000 MCG/ML injection, Inject 1,000 mcg into the muscle every 30 (thirty) days. , Disp: , Rfl:  .  diazepam (VALIUM) 5 MG tablet, TAKE 1 TABLET BY MOUTH TWICE A DAY AS NEEDED FOR ANXIETY, Disp: , Rfl: 2 .  DULoxetine (CYMBALTA) 30 MG capsule, 30 mg daily. , Disp: , Rfl:  .  gabapentin (NEURONTIN) 300 MG capsule, Take 1 capsule (300 mg total) by mouth 3 (three) times daily., Disp: 90 capsule, Rfl: 2 .  lamoTRIgine (LAMICTAL) 150 MG tablet, 150 mg 2 (two) times daily. , Disp: , Rfl:  .  nortriptyline (PAMELOR) 10 MG capsule, Take 1  capsule at bedtime in addition to one 50 mg capsule to equal 60 mg at night, Disp: , Rfl:  .  sertraline (ZOLOFT) 50 MG tablet, Take 50 mg by mouth daily., Disp: , Rfl:  .  tamsulosin (FLOMAX) 0.4 MG CAPS capsule, Take 1 capsule (0.4 mg total) by mouth daily., Disp: 90 capsule, Rfl: 3 .  Venlafaxine HCl 225 MG TB24, Take 2 tablets by mouth daily. , Disp: , Rfl:  .  atorvastatin (LIPITOR) 20 MG tablet, Take 20 mg by mouth daily at 6 PM. , Disp: , Rfl:  .  [START ON 09/19/2018] Oxycodone HCl 10 MG TABS, Take 1 tablet (10 mg total) by mouth every 8 (eight) hours as needed., Disp: 90 tablet, Rfl: 0 .  [START ON 08/20/2018] Oxycodone HCl 10 MG TABS, Take 1 tablet (10 mg total) by mouth every 8 (eight) hours as needed., Disp: 90 tablet, Rfl: 0 .  [START ON 07/21/2018] Oxycodone HCl 10 MG TABS, Take 1 tablet (10 mg total) by mouth every 8 (eight) hours  as needed., Disp: 90 tablet, Rfl: 0  ROS  Constitutional: Denies any fever or chills Gastrointestinal: No reported hemesis, hematochezia, vomiting, or acute GI distress Musculoskeletal: Denies any acute onset joint swelling, redness, loss of ROM, or weakness Neurological: No reported episodes of acute onset apraxia, aphasia, dysarthria, agnosia, amnesia, paralysis, loss of coordination, or loss of consciousness  Allergies  Mark Hodges has No Known Allergies.  PFSH  Drug: Mark Hodges  reports that he does not use drugs. Alcohol:  reports that he does not drink alcohol. Tobacco:  reports that he has been smoking. His smokeless tobacco use includes chew. Medical:  has a past medical history of Arthritis, BPH (benign prostatic hyperplasia), Clinical depression (10/23/2014), Depression, Erectile dysfunction, Hyperlipidemia, Hypogonadism in male, Lower urinary tract infection, Premature ejaculation, Prostate cancer ([place]), Stroke ([place]), and TIA (transient ischemic attack). Surgical: Mark Hodges  has a past surgical history that includes Tonsillectomy; Colonoscopy  with propofol (N/A, 01/26/2016); and Esophagogastroduodenoscopy (egd) with propofol (N/A, 01/26/2016). Family: family history includes Heart disease in his father.  Constitutional Exam  General appearance: Well nourished, well developed, and well hydrated. In no apparent acute distress Vitals:   07/19/18 0949  BP: (!) 127/95  Pulse: (!) 104  Temp: 98.4 F (36.9 C)  SpO2: 96%  Weight: 140 lb (63.5 kg)  Height: 5' 8"  (1.727 m)  Psych/Mental status: Alert, oriented x 3 (person, place, & time)       Eyes: PERLA Respiratory: No evidence of acute respiratory distress  Cervical Spine Area Exam  Skin & Axial Inspection: No masses, redness, edema, swelling, or associated skin lesions Alignment: Symmetrical Functional ROM: Unrestricted ROM      Stability: No instability detected Muscle Tone/Strength: Functionally intact. No obvious neuro-muscular anomalies detected. Sensory (Neurological): Unimpaired Palpation: No palpable anomalies              Upper Extremity (UE) Exam    Side: Right upper extremity  Side: Left upper extremity  Skin & Extremity Inspection: Skin color, temperature, and hair growth are WNL. No peripheral edema or cyanosis. No masses, redness, swelling, asymmetry, or associated skin lesions. No contractures.  Skin & Extremity Inspection: Skin color, temperature, and hair growth are WNL. No peripheral edema or cyanosis. No masses, redness, swelling, asymmetry, or associated skin lesions. No contractures.  Functional ROM: Unrestricted ROM          Functional ROM: Unrestricted ROM          Muscle Tone/Strength: Functionally intact. No obvious neuro-muscular anomalies detected.  Muscle Tone/Strength: Functionally intact. No obvious neuro-muscular anomalies detected.  Sensory (Neurological): Unimpaired          Sensory (Neurological): Unimpaired          Palpation: No palpable anomalies              Palpation: No palpable anomalies              Provocative Test(s):  Phalen's test:  deferred Tinel's test: deferred Apley's scratch test (touch opposite shoulder):  Action 1 (Across chest): deferred Action 2 (Overhead): deferred Action 3 (LB reach): deferred   Provocative Test(s):  Phalen's test: deferred Tinel's test: deferred Apley's scratch test (touch opposite shoulder):  Action 1 (Across chest): deferred Action 2 (Overhead): deferred Action 3 (LB reach): deferred    Thoracic Spine Area Exam  Skin & Axial Inspection: No masses, redness, or swelling Alignment: Symmetrical Functional ROM: Unrestricted ROM Stability: No instability detected Muscle Tone/Strength: Functionally intact. No obvious neuro-muscular anomalies detected. Sensory (Neurological): Unimpaired  Muscle strength & Tone: No palpable anomalies  Lumbar Spine Area Exam  Skin & Axial Inspection: No masses, redness, or swelling Alignment: Symmetrical Functional ROM: Unrestricted ROM       Stability: No instability detected Muscle Tone/Strength: Functionally intact. No obvious neuro-muscular anomalies detected. Sensory (Neurological): Unimpaired Palpation: No palpable anomalies       Provocative Tests: Hyperextension/rotation test: deferred today       Lumbar quadrant test (Kemp's test): deferred today       Lateral bending test: deferred today       Patrick's Maneuver: deferred today                   FABER test: deferred today                   S-I anterior distraction/compression test: deferred today         S-I lateral compression test: deferred today         S-I Thigh-thrust test: deferred today         S-I Gaenslen's test: deferred today          Gait & Posture Assessment  Ambulation: Unassisted Gait: Relatively normal for age and body habitus Posture: WNL   Lower Extremity Exam    Side: Right lower extremity  Side: Left lower extremity  Stability: No instability observed          Stability: No instability observed          Skin & Extremity Inspection: Skin color, temperature, and  hair growth are WNL. No peripheral edema or cyanosis. No masses, redness, swelling, asymmetry, or associated skin lesions. No contractures.  Skin & Extremity Inspection: Skin color, temperature, and hair growth are WNL. No peripheral edema or cyanosis. No masses, redness, swelling, asymmetry, or associated skin lesions. No contractures.  Functional ROM: Unrestricted ROM                  Functional ROM: Unrestricted ROM                  Muscle Tone/Strength: Functionally intact. No obvious neuro-muscular anomalies detected.  Muscle Tone/Strength: Functionally intact. No obvious neuro-muscular anomalies detected.  Sensory (Neurological): Unimpaired  Sensory (Neurological): Unimpaired  Palpation: No palpable anomalies  Palpation: No palpable anomalies   Assessment  Primary Diagnosis & Pertinent Problem List: The primary encounter diagnosis was Lumbar spondylosis. Diagnoses of Lumbar facet syndrome (Bilateral) (R>L), Chronic pain syndrome, and Long term prescription opiate use were also pertinent to this visit.  Status Diagnosis  Controlled Controlled Controlled 1. Lumbar spondylosis   2. Lumbar facet syndrome (Bilateral) (R>L)   3. Chronic pain syndrome   4. Long term prescription opiate use     Problems updated and reviewed during this visit: No problems updated. Plan of Care  Pharmacotherapy (Medications Ordered): Meds ordered this encounter  Medications  . Oxycodone HCl 10 MG TABS    Sig: Take 1 tablet (10 mg total) by mouth every 8 (eight) hours as needed.    Dispense:  90 tablet    Refill:  0    Do not place this medication, or any other prescription from our practice, on "Automatic Refill". Patient may have prescription filled one day early if pharmacy is closed on scheduled refill date.    Order Specific Question:   Supervising Provider    Answer:   Milinda Pointer [phone]  . Oxycodone HCl 10 MG TABS    Sig: Take 1 tablet (10 mg total)  by mouth every 8 (eight) hours as  needed.    Dispense:  90 tablet    Refill:  0    Do not place this medication, or any other prescription from our practice, on "Automatic Refill". Patient may have prescription filled one day early if pharmacy is closed on scheduled refill date.    Order Specific Question:   Supervising Provider    Answer:   Milinda Pointer [phone]  . Oxycodone HCl 10 MG TABS    Sig: Take 1 tablet (10 mg total) by mouth every 8 (eight) hours as needed.    Dispense:  90 tablet    Refill:  0    Do not place this medication, or any other prescription from our practice, on "Automatic Refill". Patient may have prescription filled one day early if pharmacy is closed on scheduled refill date.    Order Specific Question:   Supervising Provider    Answer:   Milinda Pointer [phone]  . gabapentin (NEURONTIN) 300 MG capsule    Sig: Take 1 capsule (300 mg total) by mouth 3 (three) times daily.    Dispense:  90 capsule    Refill:  2    Do not add this medication to the electronic "Automatic Refill" notification system. Patient may have prescription filled one day early if pharmacy is closed on scheduled refill date. 30 day refills only    Order Specific Question:   Supervising Provider    Answer:   Milinda Pointer [[id number]]   New Prescriptions   No medications on file   Medications administered today: Lajean Manes had no medications administered during this visit. Lab-work, procedure(s), and/or referral(s): Orders Placed This Encounter  Procedures  . ToxASSURE Select 13 (MW), Urine   Imaging and/or referral(s): None  Interventional management options: Planned, scheduled, and/or pending: Not at this time   Considering: Diagnostic bilateral greater occipital nerve block Possible bilateral greater occipital nerve RFA Possible bilateral occipital peripheral nerve stimulator trial Diagnostic right-sided cervical epidural steroid injection Diagnostic bilateral cervical facet  block Possible bilateral cervical facet RFA Palliative bilateral lumbar facetblock  Palliative bilateral lumbar facet RFA(right: 08/10/2016; left:12/19/2016) Palliative right intra-articular hipjoint injection  Diagnostic right-sided femoral nerve + obturator nerve block Possible right-sided femoral nerve + obturator nerve RFA Diagnostic bilateral L3-4 transforaminal epiduralsteroid injection  Diagnostic midline T12-L1 lumbar epiduralsteroid injection    Palliative PRN treatment(s): Diagnostic bilateral greater occipital nerve block Diagnostic bilateral cervical facet block Palliative bilateral lumbar facetblock  Palliative right intra-articular hipjoint injection  Diagnostic bilateral L3-4 transforaminal epiduralsteroid injection  Diagnostic midline T12-L1 lumbar epiduralsteroid injection     Provider-requested follow-up: Return in about 3 months (around 10/19/2018) for MedMgmt, in addition, w/ Dr. Dossie Arbour post RFA.  No future appointments. Primary Care Physician: Tracie Harrier, MD Location: [facility] Note by: Vevelyn Francois NP Date: 07/19/2018; Time: 12:22 PM  Pain Score Disclaimer: We use the NRS-11 scale. This is a self-reported, subjective measurement of pain severity with only modest accuracy. It is used primarily to identify changes within a particular patient. It must be understood that outpatient pain scales are significantly less accurate that those used for research, where they can be applied under ideal controlled circumstances with minimal exposure to variables. In reality, the score is likely to be a combination of pain intensity and pain affect, where pain affect describes the degree of emotional arousal or changes in action readiness caused by the sensory experience of pain. Factors such as social and work situation,  setting, emotional state, anxiety levels, expectation, and prior pain experience may influence pain  perception and show large inter-individual differences that may also be affected by time variables.  Patient instructions provided during this appointment:

## 2018-07-19 NOTE — Progress Notes (Signed)
Nursing Pain Medication Assessment:  Safety precautions to be maintained throughout the outpatient stay will include: orient to surroundings, keep bed in low position, maintain call bell within reach at all times, provide assistance with transfer out of bed and ambulation.  Medication Inspection Compliance: Pill count conducted under aseptic conditions, in front of the patient. Neither the pills nor the bottle was removed from the patient's sight at any time. Once count was completed pills were immediately returned to the patient in their original bottle.  Medication: Oxycodone IR Pill/Patch Count: 6 of 90 pills remain Pill/Patch Appearance: Markings consistent with prescribed medication Bottle Appearance: Standard pharmacy container. Clearly labeled. Filled Date: 28 / 22 / 2019 Last Medication intake:  Yesterday

## 2018-07-19 NOTE — Patient Instructions (Signed)
____________________________________________________________________________________________  Medication Rules  Applies to: All patients receiving prescriptions (written or electronic).  Pharmacy of record: Pharmacy where electronic prescriptions will be sent. If written prescriptions are taken to a different pharmacy, please inform the nursing staff. The pharmacy listed in the electronic medical record should be the one where you would like electronic prescriptions to be sent.  Prescription refills: Only during scheduled appointments. Applies to both, written and electronic prescriptions.  NOTE: The following applies primarily to controlled substances (Opioid* Pain Medications).   Patient's responsibilities: 1. Pain Pills: Bring all pain pills to every appointment (except for procedure appointments). 2. Pill Bottles: Bring pills in original pharmacy bottle. Always bring newest bottle. Bring bottle, even if empty. 3. Medication refills: You are responsible for knowing and keeping track of what medications you need refilled. The day before your appointment, write a list of all prescriptions that need to be refilled. Bring that list to your appointment and give it to the admitting nurse. Prescriptions will be written only during appointments. If you forget a medication, it will not be "Called in", "Faxed", or "electronically sent". You will need to get another appointment to get these prescribed. 4. Prescription Accuracy: You are responsible for carefully inspecting your prescriptions before leaving our office. Have the discharge nurse carefully go over each prescription with you, before taking them home. Make sure that your name is accurately spelled, that your address is correct. Check the name and dose of your medication to make sure it is accurate. Check the number of pills, and the written instructions to make sure they are clear and accurate. Make sure that you are given enough medication to last  until your next medication refill appointment. 5. Taking Medication: Take medication as prescribed. Never take more pills than instructed. Never take medication more frequently than prescribed. Taking less pills or less frequently is permitted and encouraged, when it comes to controlled substances (written prescriptions).  6. Inform other Doctors: Always inform, all of your healthcare providers, of all the medications you take. 7. Pain Medication from other Providers: You are not allowed to accept any additional pain medication from any other Doctor or Healthcare provider. There are two exceptions to this rule. (see below) In the event that you require additional pain medication, you are responsible for notifying us, as stated below. 8. Medication Agreement: You are responsible for carefully reading and following our Medication Agreement. This must be signed before receiving any prescriptions from our practice. Safely store a copy of your signed Agreement. Violations to the Agreement will result in no further prescriptions. (Additional copies of our Medication Agreement are available upon request.) 9. Laws, Rules, & Regulations: All patients are expected to follow all Federal and State Laws, Statutes, Rules, & Regulations. Ignorance of the Laws does not constitute a valid excuse. The use of any illegal substances is prohibited. 10. Adopted CDC guidelines & recommendations: Target dosing levels will be at or below 60 MME/day. Use of benzodiazepines** is not recommended.  Exceptions: There are only two exceptions to the rule of not receiving pain medications from other Healthcare Providers. 1. Exception #1 (Emergencies): In the event of an emergency (i.e.: accident requiring emergency care), you are allowed to receive additional pain medication. However, you are responsible for: As soon as you are able, call our office [phone], at any time of the day or night, and leave a message stating your name, the  date and nature of the emergency, and the name and dose of the medication   prescribed. In the event that your call is answered by a member of our staff, make sure to document and save the date, time, and the name of the person that took your information.  2. Exception #2 (Planned Surgery): In the event that you are scheduled by another doctor or dentist to have any type of surgery or procedure, you are allowed (for a period no longer than 30 days), to receive additional pain medication, for the acute post-op pain. However, in this case, you are responsible for picking up a copy of our "Post-op Pain Management for Surgeons" handout, and giving it to your surgeon or dentist. This document is available at our office, and does not require an appointment to obtain it. Simply go to our office during business hours (Monday-Thursday from 8:00 AM to 4:00 PM) (Friday 8:00 AM to 12:00 Noon) or if you have a scheduled appointment with us, prior to your surgery, and ask for it by name. In addition, you will need to provide us with your name, name of your surgeon, type of surgery, and date of procedure or surgery.  *Opioid medications include: morphine, codeine, oxycodone, oxymorphone, hydrocodone, hydromorphone, meperidine, tramadol, tapentadol, buprenorphine, fentanyl, methadone. **Benzodiazepine medications include: diazepam (Valium), alprazolam (Xanax), clonazepam (Klonopine), lorazepam (Ativan), clorazepate (Tranxene), chlordiazepoxide (Librium), estazolam (Prosom), oxazepam (Serax), temazepam (Restoril), triazolam (Halcion) (Last updated: 11/23/2017) ____________________________________________________________________________________________    

## 2018-07-23 LAB — TOXASSURE SELECT 13 (MW), URINE

## 2018-07-24 ENCOUNTER — Other Ambulatory Visit: Payer: Self-pay | Admitting: Nurse Practitioner

## 2018-08-29 ENCOUNTER — Other Ambulatory Visit: Payer: Medicare Other

## 2018-09-04 ENCOUNTER — Ambulatory Visit: Payer: Medicare Other | Admitting: Urology
